# Patient Record
Sex: Female | Born: 1950
Health system: Southern US, Community
[De-identification: ages and names within clinical notes are randomized; demographics above are authoritative.]

## PROBLEM LIST (undated history)

## (undated) DIAGNOSIS — J40 Bronchitis, not specified as acute or chronic: Secondary | ICD-10-CM

## (undated) DIAGNOSIS — I639 Cerebral infarction, unspecified: Secondary | ICD-10-CM

## (undated) DIAGNOSIS — F1910 Other psychoactive substance abuse, uncomplicated: Secondary | ICD-10-CM

## (undated) DIAGNOSIS — I10 Essential (primary) hypertension: Secondary | ICD-10-CM

## (undated) DIAGNOSIS — R413 Other amnesia: Secondary | ICD-10-CM

## (undated) DIAGNOSIS — E119 Type 2 diabetes mellitus without complications: Secondary | ICD-10-CM

## (undated) DIAGNOSIS — E785 Hyperlipidemia, unspecified: Secondary | ICD-10-CM

## (undated) HISTORY — DX: Hyperlipidemia, unspecified: E78.5

## (undated) HISTORY — DX: Cerebral infarction, unspecified: I63.9

## (undated) HISTORY — DX: Other psychoactive substance abuse, uncomplicated: F19.10

## (undated) HISTORY — DX: Other amnesia: R41.3

---

## 2010-07-06 ENCOUNTER — Emergency Department (HOSPITAL_COMMUNITY): Admission: EM | Admit: 2010-07-06 | Discharge: 2010-07-06 | Payer: Self-pay | Admitting: Emergency Medicine

## 2011-09-02 ENCOUNTER — Encounter: Payer: Self-pay | Admitting: *Deleted

## 2011-09-02 ENCOUNTER — Emergency Department (HOSPITAL_COMMUNITY)
Admission: EM | Admit: 2011-09-02 | Discharge: 2011-09-02 | Disposition: A | Discharge status: Home / Self Care | Payer: Medicaid Other | Attending: Emergency Medicine | Admitting: Emergency Medicine

## 2011-09-02 ENCOUNTER — Emergency Department (HOSPITAL_COMMUNITY): Payer: Medicaid Other

## 2011-09-02 DIAGNOSIS — R7309 Other abnormal glucose: Secondary | ICD-10-CM | POA: Insufficient documentation

## 2011-09-02 DIAGNOSIS — J40 Bronchitis, not specified as acute or chronic: Secondary | ICD-10-CM

## 2011-09-02 DIAGNOSIS — I10 Essential (primary) hypertension: Secondary | ICD-10-CM | POA: Insufficient documentation

## 2011-09-02 DIAGNOSIS — R739 Hyperglycemia, unspecified: Secondary | ICD-10-CM

## 2011-09-02 DIAGNOSIS — F17200 Nicotine dependence, unspecified, uncomplicated: Secondary | ICD-10-CM | POA: Insufficient documentation

## 2011-09-02 HISTORY — DX: Essential (primary) hypertension: I10

## 2011-09-02 HISTORY — DX: Bronchitis, not specified as acute or chronic: J40

## 2011-09-02 MED ORDER — ALBUTEROL SULFATE HFA 108 (90 BASE) MCG/ACT IN AERS: 1.0000 | INHALATION_SPRAY | Freq: Four times a day (QID) | RESPIRATORY_TRACT | Status: DC | PRN

## 2011-09-02 MED ORDER — PROMETHAZINE-DM 6.25-15 MG/5ML PO SYRP: 5.0000 mL | ORAL_SOLUTION | Freq: Four times a day (QID) | ORAL | Status: AC | PRN

## 2011-09-02 MED ORDER — IPRATROPIUM BROMIDE 0.02 % IN SOLN
0.5000 mg | Freq: Once | RESPIRATORY_TRACT | Status: AC
  Administered 2011-09-02: 0.5 mg via RESPIRATORY_TRACT
  Filled 2011-09-02: qty 2.5

## 2011-09-02 MED ORDER — METFORMIN HCL 500 MG PO TABS: 500.0000 mg | ORAL_TABLET | Freq: Two times a day (BID) | ORAL | Status: DC

## 2011-09-02 MED ORDER — ALBUTEROL SULFATE (5 MG/ML) 0.5% IN NEBU
5.0000 mg | INHALATION_SOLUTION | Freq: Once | RESPIRATORY_TRACT | Status: AC
  Administered 2011-09-02: 5 mg via RESPIRATORY_TRACT
  Filled 2011-09-02: qty 1

## 2011-09-02 NOTE — ED Notes (Signed)
Cough, congestion, runny nose, chest soreness x 2 wks.

## 2011-09-02 NOTE — ED Notes (Signed)
Pt a/ox4. Resp even and unlabored. NAD at this time. D/C instructions and Rx reviewed with pt. Pt verbalized understanding. Pt ambulated to lobby with steady gate.

## 2011-09-02 NOTE — ED Notes (Signed)
Pt presents with cough, chest congestion, nasal drainage, and chest tenderness with cough x 2 weeks. NAD at this time.

## 2011-09-04 NOTE — ED Provider Notes (Signed)
History     CSN: [id number] Arrival date & time: 09/02/2011 10:49 AM   First MD Initiated Contact with Patient 09/02/11 1108      Chief Complaint  Patient presents with  . Cough    (Consider location/radiation/quality/duration/timing/severity/associated sxs/prior treatment) Patient is a 60 y.o. female presenting with URI. The history is provided by the patient.  URI The primary symptoms include cough. Primary symptoms do not include fever, headaches, sore throat, abdominal pain, nausea, arthralgias or rash. The current episode started more than 1 week ago. This is a new problem. The problem has not changed since onset. The cough began more than 1 week ago. The cough is new. The cough is non-productive.  Symptoms associated with the illness include congestion and rhinorrhea. Associated symptoms comments: She also describes midsternal burning pain when she coughs.. Risk factors for severe complications from URI include diabetes mellitus (she has diabetes as she states she checks her blood glucose on a friends machine  once in a while and it runs from 200 to 400.  She has never seen a doctor for this.).    Past Medical History  Diagnosis Date  . Hypertension   . Bronchitis     History reviewed. No pertinent past surgical history.  No family history on file.  History  Substance Use Topics  . Smoking status: Current Everyday Smoker    Types: Cigarettes  . Smokeless tobacco: Not on file  . Alcohol Use: Yes     occasional    OB History    Grav Para Term Preterm Abortions TAB SAB Ect Mult Living                  Review of Systems  Constitutional: Negative for fever.  HENT: Positive for congestion and rhinorrhea. Negative for sore throat and neck pain.   Eyes: Negative.   Respiratory: Positive for cough and chest tightness. Negative for shortness of breath.   Cardiovascular: Positive for chest pain.  Gastrointestinal: Negative for nausea and abdominal pain.  Genitourinary:  Negative.   Musculoskeletal: Negative for joint swelling and arthralgias.  Skin: Negative.  Negative for rash and wound.  Neurological: Negative for dizziness, weakness, light-headedness, numbness and headaches.  Hematological: Negative.   Psychiatric/Behavioral: Negative.     Allergies  Review of patient's allergies indicates no known allergies.  Home Medications   Current Outpatient Rx  Name Route Sig Dispense Refill  . ALBUTEROL SULFATE HFA 108 (90 BASE) MCG/ACT IN AERS Inhalation Inhale 1-2 puffs into the lungs every 6 (six) hours as needed for wheezing. 1 Inhaler 0  . METFORMIN HCL 500 MG PO TABS Oral Take 1 tablet (500 mg total) by mouth 2 (two) times daily with a meal. 60 tablet 0  . PROMETHAZINE-DM 6.25-15 MG/5ML PO SYRP Oral Take 5 mLs by mouth 4 (four) times daily as needed for cough. 118 mL 0    BP 178/92  Pulse 99  Temp(Src) 100 F (37.8 C) (Oral)  Resp 20  Ht 5\' 4"  (1.626 m)  Wt 210 lb (95.255 kg)  BMI 36.05 kg/m2  SpO2 97%  Physical Exam  Nursing note and vitals reviewed. Constitutional: She is oriented to person, place, and time. She appears well-developed and well-nourished.  HENT:  Head: Normocephalic and atraumatic.  Nose: Rhinorrhea present.  Mouth/Throat: Uvula is midline, oropharynx is clear and moist and mucous membranes are normal.  Eyes: Conjunctivae are normal.  Neck: Normal range of motion.  Cardiovascular: Normal rate, regular rhythm, normal heart sounds and  intact distal pulses.   No murmur heard. Pulmonary/Chest: She is in respiratory distress. She has wheezes. She exhibits tenderness.       Frequent cough  Abdominal: Soft. Bowel sounds are normal. There is no tenderness.  Musculoskeletal: Normal range of motion.  Neurological: She is alert and oriented to person, place, and time.  Skin: Skin is warm and dry.  Psychiatric: She has a normal mood and affect.    ED Course  Procedures (including critical care time)  Labs Reviewed    GLUCOSE, CAPILLARY - Abnormal; Notable for the following:    Glucose-Capillary 241 (*)    All other components within normal limits  LAB REPORT - SCANNED   Dg Chest 2 View  09/02/2011  *RADIOLOGY REPORT*  Clinical Data: Cough and shortness of breath, smoker  CHEST - 2 VIEW  Comparison: July 06, 2010  Findings: The cardiac silhouette, mediastinum, pulmonary vasculature are within normal limits.  Both lungs are clear.  There is no acute bony abnormality.  IMPRESSION: There is no evidence of acute cardiac or pulmonary process.  Original Report Authenticated By: Duayne Cal, M.D.     1. Bronchitis   2. Hyperglycemia       MDM  Bronchitis with normal chest xray.  Untreated DM ,  Also untreated bp.  Pt started on glucophage - given 30 day supply.  Strongly encouraged obtaining pcp.  Referrals given.  Albuterol mdi ,  Promethazine DM for cough.        Fulton Reek, [place] 09/04/11 [phone]

## 2011-09-06 NOTE — ED Provider Notes (Signed)
Medical screening examination/treatment/procedure(s) were conducted as a shared visit with non-physician practitioner(s) and myself.  I personally evaluated the patient during the encounter. History  And physical consistent with bronchitis.  He has been noncompliant with diabetes and blood pressure medication.  Nat Christen, MD 09/06/11 [phone]

## 2013-05-10 ENCOUNTER — Emergency Department (HOSPITAL_COMMUNITY)
Admission: EM | Admit: 2013-05-10 | Discharge: 2013-05-10 | Disposition: A | Discharge status: Home / Self Care | Payer: Medicaid Other | Attending: Emergency Medicine | Admitting: Emergency Medicine

## 2013-05-10 ENCOUNTER — Emergency Department (HOSPITAL_COMMUNITY): Payer: Medicaid Other

## 2013-05-10 ENCOUNTER — Encounter (HOSPITAL_COMMUNITY): Payer: Self-pay | Admitting: Emergency Medicine

## 2013-05-10 DIAGNOSIS — Z8709 Personal history of other diseases of the respiratory system: Secondary | ICD-10-CM | POA: Insufficient documentation

## 2013-05-10 DIAGNOSIS — E1169 Type 2 diabetes mellitus with other specified complication: Secondary | ICD-10-CM | POA: Insufficient documentation

## 2013-05-10 DIAGNOSIS — E7800 Pure hypercholesterolemia, unspecified: Secondary | ICD-10-CM | POA: Insufficient documentation

## 2013-05-10 DIAGNOSIS — I10 Essential (primary) hypertension: Secondary | ICD-10-CM

## 2013-05-10 DIAGNOSIS — R413 Other amnesia: Secondary | ICD-10-CM | POA: Insufficient documentation

## 2013-05-10 DIAGNOSIS — R739 Hyperglycemia, unspecified: Secondary | ICD-10-CM

## 2013-05-10 DIAGNOSIS — Z79899 Other long term (current) drug therapy: Secondary | ICD-10-CM | POA: Insufficient documentation

## 2013-05-10 DIAGNOSIS — Z7982 Long term (current) use of aspirin: Secondary | ICD-10-CM | POA: Insufficient documentation

## 2013-05-10 DIAGNOSIS — F17200 Nicotine dependence, unspecified, uncomplicated: Secondary | ICD-10-CM | POA: Insufficient documentation

## 2013-05-10 DIAGNOSIS — R809 Proteinuria, unspecified: Secondary | ICD-10-CM | POA: Insufficient documentation

## 2013-05-10 HISTORY — DX: Type 2 diabetes mellitus without complications: E11.9

## 2013-05-10 LAB — CBC WITH DIFFERENTIAL/PLATELET
Basophils Absolute: 0 10*3/uL (ref 0.0–0.1)
Basophils Relative: 0 % (ref 0–1)
Eosinophils Relative: 1 % (ref 0–5)
HCT: 44.9 % (ref 36.0–46.0)
MCHC: 33.2 g/dL (ref 30.0–36.0)
MCV: 88 fL (ref 78.0–100.0)
Monocytes Absolute: 0.4 10*3/uL (ref 0.1–1.0)
Platelets: 223 10*3/uL (ref 150–400)
RDW: 12.7 % (ref 11.5–15.5)

## 2013-05-10 LAB — RAPID URINE DRUG SCREEN, HOSP PERFORMED
Amphetamines: NOT DETECTED
Barbiturates: NOT DETECTED
Benzodiazepines: NOT DETECTED
Cocaine: NOT DETECTED

## 2013-05-10 LAB — COMPREHENSIVE METABOLIC PANEL
ALT: 12 U/L (ref 0–35)
AST: 27 U/L (ref 0–37)
Albumin: 2.9 g/dL — ABNORMAL LOW (ref 3.5–5.2)
Calcium: 9.3 mg/dL (ref 8.4–10.5)
Creatinine, Ser: 1.2 mg/dL — ABNORMAL HIGH (ref 0.50–1.10)
Sodium: 136 mEq/L (ref 135–145)
Total Protein: 7 g/dL (ref 6.0–8.3)

## 2013-05-10 LAB — URINALYSIS, ROUTINE W REFLEX MICROSCOPIC
Glucose, UA: 250 mg/dL — AB
Specific Gravity, Urine: >1.03 — ABNORMAL HIGH (ref 1.005–1.030)
Urobilinogen, UA: 0.2 mg/dL (ref 0.0–1.0)
pH: 5.5 (ref 5.0–8.0)

## 2013-05-10 LAB — GLUCOSE, CAPILLARY: Glucose-Capillary: 252 mg/dL — ABNORMAL HIGH (ref 70–99)

## 2013-05-10 LAB — URINE MICROSCOPIC-ADD ON

## 2013-05-10 LAB — TROPONIN I: Troponin I: <0.3 ng/mL (ref <0.30)

## 2013-05-10 MED ORDER — METFORMIN HCL 500 MG PO TABS: 500.0000 mg | ORAL_TABLET | Freq: Two times a day (BID) | ORAL | Status: DC

## 2013-05-10 MED ORDER — HYDROCHLOROTHIAZIDE 25 MG PO TABS: 25.0000 mg | ORAL_TABLET | Freq: Every day | ORAL | Status: DC

## 2013-05-10 NOTE — ED Notes (Addendum)
Patient brought in by family for altered mental status x2 weeks. Patient alert but orientation questionable. Patient answers some questions appropriately. Per family patient found on floor yesterday and stated to them that she had laid there all day. Patient now states that she doesn't recall falling.

## 2013-05-10 NOTE — ED Notes (Signed)
Patient stated if "I could have something to drink I could go to the bathroom".  Nurse approved and patient was given water.

## 2013-05-10 NOTE — ED Notes (Signed)
While removing pt's pants a key and a small box cutter fell from pt's pants pocket. Daughter sitting at bedside became upset as the pt could not tell family member why she had such a device in her pocket.reaasured daughter.

## 2013-05-10 NOTE — ED Provider Notes (Signed)
CSN: [id number]     Arrival date & time 05/10/13  1015 History  This chart was scribed for Johnna Acosta, MD by Jenne Campus, ED Scribe. This patient was seen in room APA16A/APA16A and the patient's care was started at 10:39 AM.   CC: AMS  The history is provided by the patient. No language interpreter was used.    HPI Comments: Kathryn Cook is a 62 y.o. female who presents to the Emergency Department complaining of  gradual onset, gradually worsening, constant AMS for the past 2 months. Sister states that the pt has been having memory trouble and has stopped taking care of herself or doing her normal daily activities. Sister states that she first noticed the symptoms when the pt could not remember her pin number at the grocery store and the symptoms have been progressively worsening since. Sister decided to bring the pt in after her son found her laying in the floor of her bedroom covered in feces, shaking and crying around 11 PM last night. When asked what's wrong, the pt says "some kind of something". Pt appears to remember her address, family and past careers but does not remember recent events. Sister states that the pt was possibly diagnosed with DM or blood sugar problem in jail 3 years ago but did not follow up. She also has a h/o HTN but has been non-complaint with medications. Pt reports a ongoing cough but denies fevers, emesis and diarrhea as associated symptoms. She states that she used to use cocaine, last use was a couple of years ago.  No PCP  Lives with sister, son occassionally stays with pt  Past Medical History  Diagnosis Date  . Hypertension   . Bronchitis   . Diabetes mellitus without complication    History reviewed. No pertinent past surgical history. History reviewed. No pertinent family history. History  Substance Use Topics  . Smoking status: Current Every Day Smoker -- 0.25 packs/day for 45 years    Types: Cigarettes  . Smokeless tobacco: Never Used   . Alcohol Use: Yes     Comment: occasional   No OB history provided.  Review of Systems  Unable to perform ROS: Mental status change    Allergies  Review of patient's allergies indicates no known allergies.  Home Medications   Current Outpatient Rx  Name  Route  Sig  Dispense  Refill  . acetaminophen (TYLENOL) 500 MG tablet   Oral   Take 500 mg by mouth every 6 (six) hours as needed for pain.         Marland Kitchen aspirin 325 MG tablet   Oral   Take 325 mg by mouth daily.         . Aspirin-Acetaminophen-Caffeine (GOODYS EXTRA STRENGTH PO)   Oral   Take 1 Package by mouth daily as needed.         Marland Kitchen ibuprofen (ADVIL,MOTRIN) 200 MG tablet   Oral   Take 400 mg by mouth every 6 (six) hours as needed for pain.         . hydrochlorothiazide (HYDRODIURIL) 25 MG tablet   Oral   Take 1 tablet (25 mg total) by mouth daily.   30 tablet   1   . metFORMIN (GLUCOPHAGE) 500 MG tablet   Oral   Take 1 tablet (500 mg total) by mouth 2 (two) times daily with a meal.   60 tablet   1    BP 198/100  Pulse 73  Temp(Src) 98.4 F (  36.9 C) (Oral)  Resp 20  Ht 5\' 4"  (1.626 m)  Wt 210 lb (95.255 kg)  BMI 36.03 kg/m2  SpO2 100%  Physical Exam  Nursing note and vitals reviewed. Constitutional: She appears well-developed and well-nourished. No distress.  HENT:  Head: Normocephalic and atraumatic.  Tongue is blue  Eyes: Conjunctivae and EOM are normal.  Neck: Neck supple. No tracheal deviation present. No thyromegaly present.  Cardiovascular: Normal rate and regular rhythm.   No murmur heard. Pulmonary/Chest: Effort normal and breath sounds normal. No respiratory distress.  Abdominal: Soft. She exhibits no distension.  Musculoskeletal: Normal range of motion. She exhibits no edema.  Lymphadenopathy:    She has no cervical adenopathy.  Neurological: She is alert. No sensory deficit.  Neurologic exam:  Speech clear, pupils equal round reactive to light, extraocular movements  intact  Normal peripheral visual fields Cranial nerves III through XII normal including no facial droop Follows commands, moves all extremities x4, normal strength to bilateral upper and lower extremities at all major muscle groups including grip Sensation normal to light touch and pinprick Coordination intact, no limb ataxia, finger-nose-finger normal Rapid alternating movements normal No pronator drift Gait normal memory impaired to both short term and long term memory but follows commands without difficulty, alert and oriented to person, location, contact information and family  Skin: Skin is dry.  Psychiatric: She has a normal mood and affect. Her behavior is normal.    ED Course   DIAGNOSTIC STUDIES: Oxygen Saturation is 100% % on ra, normal by my interpretation.    COORDINATION OF CARE: 10:47 AM-Discussed treatment plan which includes CXR, CT of head, CBC panel, CMP and UA with pt at bedside and pt agreed to plan.   Procedures (including critical care time)  Labs Reviewed  COMPREHENSIVE METABOLIC PANEL - Abnormal; Notable for the following:    Glucose, Bld 244 (*)    Creatinine, Ser 1.20 (*)    Albumin 2.9 (*)    GFR calc non Af Amer 48 (*)    GFR calc Af Amer 55 (*)    All other components within normal limits  URINALYSIS, ROUTINE W REFLEX MICROSCOPIC - Abnormal; Notable for the following:    Specific Gravity, Urine >1.030 (*)    Glucose, UA 250 (*)    Hgb urine dipstick MODERATE (*)    Bilirubin Urine SMALL (*)    Protein, ur >300 (*)    All other components within normal limits  GLUCOSE, CAPILLARY - Abnormal; Notable for the following:    Glucose-Capillary 252 (*)    All other components within normal limits  URINE MICROSCOPIC-ADD ON - Abnormal; Notable for the following:    Squamous Epithelial / LPF FEW (*)    Bacteria, UA FEW (*)    Casts GRANULAR CAST (*)    All other components within normal limits  URINE RAPID DRUG SCREEN (HOSP PERFORMED)  CBC WITH  DIFFERENTIAL  TROPONIN I   Dg Chest 1 View  05/10/2013   *RADIOLOGY REPORT*  Clinical Data: Shortness of breath.  CHEST - 1 VIEW  Comparison: 09/02/2011.  Findings: Trachea is midline.  Heart size is accentuated by AP technique.  Lungs are clear.  No pleural fluid.  IMPRESSION: No acute findings.   Original Report Authenticated By: Lorin Picket, M.D.   Ct Head Wo Contrast  05/10/2013   *RADIOLOGY REPORT*  Clinical Data: Altered mental status and memory loss.  CT HEAD WITHOUT CONTRAST  Technique:  Contiguous axial images were obtained from the  base of the skull through the vertex without contrast.  Comparison: None.  Findings: No evidence of acute infarct, acute hemorrhage, mass lesion, mass effect or hydrocephalus.  Atrophy.  Moderate periventricular low attenuation.  Possible remote lacunar infarcts scattered in the thalami and basal ganglia.  Visualized portions of the paranasal sinuses and mastoid air cells are clear.  IMPRESSION:  1.  No acute intracranial abnormality. 2.  Atrophy and chronic microvascular white matter ischemic changes.   Original Report Authenticated By: Lorin Picket, M.D.   1. Hyperglycemia   2. Memory loss   3. Proteinuria   4. Hypertension     MDM  The pt has new onset AMS which was gradual in onset and persistent, gradually worsening per sister whom she lives with.  She has no focal findings other than some memory loss on exam.  She is benign in appearance, VS show hypertension  ED ECG REPORT  I personally interpreted this EKG   Date: 05/10/2013   Rate: 81  Rhythm: normal sinus rhythm  QRS Axis: left  Intervals: normal  ST/T Wave abnormalities: nonspecific ST/T changes  Conduction Disutrbances:none  Narrative Interpretation: LVH with repol abnormality present  Old EKG Reviewed: none available  Laboratory workup is normal except for proteinuria, mild hyperglycemia and mild dehydration. I have informed the patient these results and she has expressed her  understanding to the need for followup. Family members are here and will help her take her medications and followup with local family doctors.  I will also start her on antihypertensives  Meds given in ED:  Medications - No data to display  New Prescriptions   HYDROCHLOROTHIAZIDE (HYDRODIURIL) 25 MG TABLET    Take 1 tablet (25 mg total) by mouth daily.   METFORMIN (GLUCOPHAGE) 500 MG TABLET    Take 1 tablet (500 mg total) by mouth 2 (two) times daily with a meal.      I personally performed the services described in this documentation, which was scribed in my presence. The recorded information has been reviewed and is accurate.        Johnna Acosta, MD 05/10/13 [phone]

## 2013-05-11 LAB — URINE CULTURE

## 2013-05-31 ENCOUNTER — Encounter: Payer: Self-pay | Admitting: Family Medicine

## 2013-05-31 ENCOUNTER — Ambulatory Visit (INDEPENDENT_AMBULATORY_CARE_PROVIDER_SITE_OTHER): Payer: Medicaid Other | Admitting: Family Medicine

## 2013-05-31 VITALS — BP 168/100 | Ht 65.0 in | Wt 201.6 lb

## 2013-05-31 DIAGNOSIS — F1021 Alcohol dependence, in remission: Secondary | ICD-10-CM

## 2013-05-31 DIAGNOSIS — E119 Type 2 diabetes mellitus without complications: Secondary | ICD-10-CM | POA: Insufficient documentation

## 2013-05-31 DIAGNOSIS — Z87898 Personal history of other specified conditions: Secondary | ICD-10-CM

## 2013-05-31 DIAGNOSIS — R413 Other amnesia: Secondary | ICD-10-CM | POA: Insufficient documentation

## 2013-05-31 DIAGNOSIS — I10 Essential (primary) hypertension: Secondary | ICD-10-CM

## 2013-05-31 MED ORDER — ACCU-CHEK SOFT TOUCH LANCETS MISC: Status: DC

## 2013-05-31 MED ORDER — ACCU-CHEK ADVANTAGE DIABETES KIT: PACK | Status: DC

## 2013-05-31 MED ORDER — GLUCOSE BLOOD VI STRP: ORAL_STRIP | Status: DC

## 2013-05-31 NOTE — Patient Instructions (Addendum)
Dementia Dementia is a word that is used to describe problems with the brain and how it works. People with dementia have memory loss. They may also have problems with thinking, speaking, or solving problems. It can affect how they act around people, how they do their job, their mood, and their personality. These changes may not show up for a long time. Family or friends may not notice problems in the early part of this disease. HOME CARE The following tips are for the person living with, or caring for, the person with dementia. Make the home safe.  Remove locks on bathroom doors.  Use childproof locks on cabinets where alcohol, cleaning supplies, or chemicals are stored.  Put outlet covers in electrical outlets.  Put in childproof locks to keep doors and windows safe.  Remove stove knobs, or put in safety knobs that shut off on their own.  Lower the temperature on water heaters.  Label medicines. Lock them in a safe place.  Keep knives, lighters, matches, power tools, and guns out of reach or in a safe place.  Remove objects that might break or can hurt the person.  Make sure lighting is good inside and outside.  Put in grab bars if needed.  Use a device that detects falls or other needs for help. Lessen confusion.  Keep familiar objects and people around.  Use night lights or low lit (dim) lights at night.  Label objects or areas.  Use reminders, notes, or directions for daily activities or tasks.  Keep a simple routine that is the same for waking, meals, bathing, dressing, and bedtime.  Create a calm and quiet home.  Put up clocks and calendars.  Keep emergency numbers and the home address near all phones.  Help show the different times of day. Open the curtains during the day to let light in. Speak clearly and directly.  Choose simple words and short sentences.  Use a gentle, calm voice.  Do not interrupt.  If the person has a hard time finding a word to  use, give them the word or thought.  Ask 1 question at a time. Give enough time for the person to answer. Repeat the question if the person does not answer. Do things that lessen restlessness.  Provide a comfortable bed.  Have the same bedtime routine every night.  Have a regular walking and activity schedule.  Lessen naps during the day.  Do not let the person drink a lot of caffeine.  Go to events that are not overwhelming. Eat well and drink fluids.  Lessen distractions during meal times and snacks.  Avoid foods that are too hot or too cold.  Watch how the person chews and swallows. This is to make sure they do not choke. Other  Keep all vision, hearing, dental, and medical visits with the doctor.  Only give medicines as told by the doctor.  Watch the person's driving ability. Do not let the person drive if he or she cannot drive safely.  Use a program that helps find a person if they become missing. You may need to register with this program. GET HELP RIGHT AWAY IF:   A fever of 102 F (38.9 C) develops.  Confusion develops or gets worse.  Sleepiness develops or gets worse.  Staying awake is hard to do.  New behavior problems start like mood swings, aggression, and seeing things that are not there.  Problems with balance, speech, or falling develop.  Problems swallowing develop.  Any  problems of another sickness develop. MAKE SURE YOU:  Understand these instructions.  Will watch his or her condition.  Will get help right away if he or she is not doing well or gets worse. Document Released: 08/26/2008 Document Revised: 12/06/2011 Document Reviewed: 02/08/2011 [facility] Patient Information 2014 [place], [place]. Blood Sugar Monitoring, Adult GLUCOSE METERS FOR SELF-MONITORING OF BLOOD GLUCOSE  It is important to be able to correctly measure your blood sugar (glucose). You can use a blood glucose monitor (a small battery-operated device) to check your  glucose level at any time. This allows you and your caregiver to monitor your diabetes and to determine how well your treatment plan is working. The process of monitoring your blood glucose with a glucose meter is called self-monitoring of blood glucose (SMBG). When people with diabetes control their blood sugar, they have better health. To test for glucose with a typical glucose meter, place the disposable strip in the meter. Then place a small sample of blood on the "test strip." The test strip is coated with chemicals that combine with glucose in blood. The meter measures how much glucose is present. The meter displays the glucose level as a number. Several new models can record and store a number of test results. Some models can connect to personal computers to store test results or print them out.  Newer meters are often easier to use than older models. Some meters allow you to get blood from places other than your fingertip. Some new models have automatic timing, error codes, signals, or barcode readers to help with proper adjustment (calibration). Some meters have a large display screen or spoken instructions for people with visual impairments.  INSTRUCTIONS FOR USING GLUCOSE METERS  Wash your hands with soap and warm water, or clean the area with alcohol. Dry your hands completely.  Prick the side of your fingertip with a lancet (a sharp-pointed tool used by hand).  Hold the hand down and gently milk the finger until a small drop of blood appears. Catch the blood with the test strip.  Follow the instructions for inserting the test strip and using the SMBG meter. Most meters require the meter to be turned on and the test strip to be inserted before applying the blood sample.  Record the test result.  Read the instructions carefully for both the meter and the test strips that go with it. Meter instructions are found in the user manual. Keep this manual to help you solve any problems that may  arise. Many meters use "error codes" when there is a problem with the meter, the test strip, or the blood sample on the strip. You will need the manual to understand these error codes and fix the problem.  New devices are available such as laser lancets and meters that can test blood taken from "alternative sites" of the body, other than fingertips. However, you should use standard fingertip testing if your glucose changes rapidly. Also, use standard testing if:  You have eaten, exercised, or taken insulin in the past 2 hours.  You think your glucose is low.  You tend to not feel symptoms of low blood glucose (hypoglycemia).  You are ill or under stress.  Clean the meter as directed by the manufacturer.  Test the meter for accuracy as directed by the manufacturer.  Take your meter with you to your caregiver's office. This way, you can test your glucose in front of your caregiver to make sure you are using the meter correctly. Your  caregiver can also take a sample of blood to test using a routine lab method. If values on the glucose meter are close to the lab results, you and your caregiver will see that your meter is working well and you are using good technique. Your caregiver will advise you about what to do if the results do not match. FREQUENCY OF TESTING  Your caregiver will tell you how often you should check your blood glucose. This will depend on your type of diabetes, your current level of diabetes control, and your types of medicines. The following are general guidelines, but your care plan may be different. Record all your readings and the time of day you took them for review with your caregiver.   Diabetes type 1.  When you are using insulin with good diabetic control (either multiple daily injections or via a pump), you should check your glucose 4 times a day.  If your diabetes is not well controlled, you may need to monitor more frequently, including before meals and 2 hours  after meals, at bedtime, and occasionally between 2 a.m. and 3 a.m.  You should always check your glucose before a dose of insulin or before changing the rate on your insulin pump.  Diabetes type 2.  Guidelines for SMBG in diabetes type 2 are not as well defined.  If you are on insulin, follow the guidelines above.  If you are on medicines, but not insulin, and your glucose is not well controlled, you should test at least twice daily.  If you are not on insulin, and your diabetes is controlled with medicines or diet alone, you should test at least once daily, usually before breakfast.  A weekly profile will help your caregiver advise you on your care plan. The week before your visit, check your glucose before a meal and 2 hours after a meal at least daily. You may want to test before and after a different meal each day so you and your caregiver can tell how well controlled your blood sugars are throughout the course of a 24 hour period.  Gestational diabetes (diabetes during pregnancy).  Frequent testing is often necessary. Accurate timing is important.  If you are not on insulin, check your glucose 4 times a day. Check it before breakfast and 1 hour after the start of each meal.  If you are on insulin, check your glucose 6 times a day. Check it before each meal and 1 hour after the first bite of each meal.  General guidelines.  More frequent testing is required at the start of insulin treatment. Your caregiver will instruct you.  Test your glucose any time you suspect you have low blood sugar (hypoglycemia).  You should test more often when you change medicines, when you have unusual stress or illness, or in other unusual circumstances. OTHER THINGS TO KNOW ABOUT GLUCOSE METERS  Measurement Range. Most glucose meters are able to read glucose levels over a broad range of values from as low as 0 to as high as 600 mg/dL. If you get an extremely high or low reading from your meter, you  should first confirm it with another reading. Report very high or very low readings to your caregiver.  Whole Blood Glucose versus Plasma Glucose. Some older home glucose meters measure glucose in your whole blood. In a lab or when using some newer home glucose meters, the glucose is measured in your plasma (one component of blood). The difference can be important. It is important  for you and your caregiver to know whether your meter gives its results as "whole blood equivalent" or "plasma equivalent."  Display of High and Low Glucose Values. Part of learning how to operate a meter is understanding what the meter results mean. Know how high and low glucose concentrations are displayed on your meter.  Factors that Affect Glucose Meter Performance. The accuracy of your test results depends on many factors and varies depending on the brand and type of meter. These factors include:  Low red blood cell count (anemia).  Substances in your blood (such as uric acid, vitamin C, and others).  Environmental factors (temperature, humidity, altitude).  Name-brand versus generic test strips.  Calibration. Make sure your meter is set up properly. It is a good idea to do a calibration test with a control solution recommended by the manufacturer of your meter whenever you begin using a fresh bottle of test strips. This will help verify the accuracy of your meter.  Improperly stored, expired, or defective test strips. Keep your strips in a dry place with the lid on.  Soiled meter.  Inadequate blood sample. NEW TECHNOLOGIES FOR GLUCOSE TESTING Alternative site testing Some glucose meters allow testing blood from alternative sites. These include the:  Upper arm.  Forearm.  Base of the thumb.  Thigh. Sampling blood from alternative sites may be desirable. However, it may have some limitations. Blood in the fingertips show changes in glucose levels more quickly than blood in other parts of the body. This  means that alternative site test results may be different from fingertip test results, not because of the meter's ability to test accurately, but because the actual glucose concentration can be different.  Continuous Glucose Monitoring Devices to measure your blood glucose continuously are available, and others are in development. These methods can be more expensive than self-monitoring with a glucose meter. However, it is uncertain how effective and reliable these devices are. Your caregiver will advise you if this approach makes sense for you. IF BLOOD SUGARS ARE CONTROLLED, PEOPLE WITH DIABETES REMAIN HEALTHIER.  SMBG is an important part of the treatment plan of patients with diabetes mellitus. Below are reasons for using SMBG:   It confirms that your glucose is at a specific, healthy level.  It detects hypoglycemia and severe hyperglycemia.  It allows you and your caregiver to make adjustments in response to changes in lifestyle for individuals requiring medicine.  It determines the need for starting insulin therapy in temporary diabetes that happens during pregnancy (gestational diabetes). Document Released: 09/16/2003 Document Revised: 12/06/2011 Document Reviewed: 01/07/2011 [facility] Patient Information 2014 [place].

## 2013-05-31 NOTE — Progress Notes (Signed)
Subjective:    Patient ID: Kathryn Cook, female    DOB: Aug 08, 1951, 62 y.o.   MRN: [id number]  HPI Comments: Kathryn Cook is a 62 y.o AAF here to establish care as a new patient.  She is here with her 61 y.o sister also of whom she lives with. The sister begins by stating that she had to go to the ER in Aug 2014 due to the patient's son finding her on the floor. When he found her on the floor one night, he stated that she had loss bowel and bladder incontinence. He says the urine was dry on her clothes so he is unsure how long she was down for.  The sister carried the patient tot he ER that next morning and had labs done which revealed an elevated blood sugar around 250s.  She was also hypertensive. She had a CT scan of her head which showed microvascular disease but no acute changes; no mass effect, hydrocephalus, hemorrhage. It did show some remote lacunar infarcts and periventricular low attenuation.  She also had an EKG which showed NSR with left atrial enlargement with LVH and T wave abnormality which would be considered as lateral ischemia, age undetermined. She was sent out that same day on HCTZ and Metformin for her HTN and DM. The sister says they  Have no idea what her sugar has been at home due to them not sending her home with a meter or strips.   The sister also has complaints of forgetfullness. She says the patient has gotten worse in the last 3-4 months. She recalls an episode where the patient has walked outside of her home down the street and was found by a neighbor who brought her back home. The patient at that time, had no idea where she was wanting to go. The sister says she has forgotten phone numbers and names of important family members that she shouldn't be forgetting. The sister reports a decline in the capability of performing her ADL'S. She says she has slowed down a great bit and also that it takes her a long time to walk across the room. ADLS: per sister is that she has to do  everything for the patient. She use to be able to cook and dress and groom herself. The sister says she has to do everything for her including make her bathe. She says she has gotten to the point that she doesn't even want to bathe anymore. This has been going on for the last 3-4 months, even before she was found down and had to go to the ER in Aug 2014.  The sister also states that in the last 2-3 months, she has slowed down a lot.  The sister states she has had memory loss that has gotten worse. This is the reason of coming to the clinic today.   PMH: diabetes and HTN Medicines: HCTZ and Metformin Allergies: none Past surgeries: none Family hx: lives with 26 y.o sister who is here today of which she lives with. Her 61 y.o sister lives in [place]. No hx of dementia in the family. Social: smoke sometimes a few cigarettes a week. She hasn't used alcohol in the last 3 months. She says she use to drink 2-3 cans of beer a week. The 20 oz cans most occasions.     Review of Systems  Constitutional: Negative for chills, appetite change, fatigue and unexpected weight change.  HENT: Negative for hearing loss, ear pain, congestion and neck pain.  Eyes: Negative for itching and visual disturbance.  Respiratory: Negative for chest tightness, shortness of breath and wheezing.   Cardiovascular: Negative for chest pain and palpitations.  Gastrointestinal: Negative for nausea, abdominal pain, diarrhea and constipation.  Endocrine: Negative for polydipsia and polyuria.  Musculoskeletal: Negative for myalgias and back pain.  Neurological: Negative for dizziness, seizures, syncope, weakness, numbness and headaches.  Psychiatric/Behavioral: Positive for confusion and decreased concentration. Negative for suicidal ideas, sleep disturbance, self-injury and agitation. The patient is not nervous/anxious and is not hyperactive.        Memory loss per sister; patient says she can't remember things as before         Objective:   Physical Exam  Nursing note and vitals reviewed. Constitutional: She appears well-developed and well-nourished.  HENT:  Head: Normocephalic and atraumatic.  Right Ear: External ear normal.  Left Ear: External ear normal.  Eyes: Pupils are equal, round, and reactive to light. Scleral icterus is present.  No nystagmus  Cardiovascular: Normal rate, regular rhythm and normal heart sounds.   Pulmonary/Chest: Effort normal and breath sounds normal.  Abdominal: Soft. Bowel sounds are normal. She exhibits no distension. There is no tenderness.  Musculoskeletal: Normal range of motion.  Neurological: She is alert. She has normal reflexes. She is disoriented. She displays a negative Romberg sign.  MMSE-15  Skin: Skin is warm and dry.  Psychiatric: She has a normal mood and affect. Her speech is normal. Thought content normal. She is slowed. She is not hyperactive, not withdrawn and not combative. Cognition and memory are impaired. She exhibits abnormal recent memory.  Patient can't answer questions regarding date or time of year correctly. She often says she forgot but she'll remember in a little while She is inattentive.      Assessment & Plan:  Kanosha was seen today for new patient, hypertension and blood sugar problem.  Diagnoses and associated orders for this visit:  Kidney disease, chronic, stage III (GFR 30-59 ml/min)  Memory loss - TSH; Future - RPR; Future - Comprehensive metabolic panel; Future - CBC with Differential; Future - Magnesium; Future - Vitamin B12; Future - Folate; Future - Drug screen panel (serum); Future - Vitamin B1; Future - TSH - RPR - Comprehensive metabolic panel - CBC with Differential - Magnesium - Vitamin B12 - Folate - Drug screen panel (serum) - Vitamin B1  Diabetes - Ambulatory referral to diabetic education - Hemoglobin A1c; Future - Hemoglobin A1c  HTN (hypertension)  History of alcohol use  -due to memory  loss, will get labs to include Vitamin B12, B1(thiamine) and folate. Adding B1 due to hx of alcohol use and if low, her memory loss will hint at a possible Wernicke's encephalopathy picture.  Also checking TSH, RPR, along with renal and liver function studies. She has a hx of depressed kidney function when seen in the ER last month and since starting HCTZ and metformin, will recheck renal function today. Will also get Hgb A1c and drug screen.   -Sending to diabetic education for newly diagnosed diabetes. Also sending a rx for diabetic testing meter and strips to keep track of it at home. Patient instructed to check blood sugars morning and evening time.   -to follow up on labs and to follow up in 2 weeks.  -have counseled patient and sister the need for patient to have 24 hour supervision at all times. Based on her symptoms per sister and the MMSE results, this is imperative for her safety. The sister and  patient voiced understanding.

## 2013-05-31 NOTE — Progress Notes (Signed)
Patient ID: JOVONNE WOHLWEND, female   DOB: Aug 07, 1951, 62 y.o.   MRN: [id number]  Kerin is a 62 y.o AAF here to establish care as a new patient.  She has a PMH of dementia where her sister states she will leave home and forgets where she's going. She also had HTN and Diabetes. She was just recently diagnosed with diabetes in August 2014 at the ER.  She went because her 54 y.o son found her on the floor at home at night.  He noted that she had bowel movement and urine along with shakiness.  Her sister took her to the ER the next morning. A EKG was done along with blood work. She was diagnosed with diabetes at that time.  She also was given a medication for HTN and diabetes and was discharged from the ER.  She didn't have a meter to check her sugar so they don't know what her sugar has been.  ADLS: per sister is that she has to do everything for the patient.   The sister also states that in the last 2-3 months, she has slowed down a lot.  The sister states she has had memory loss that has gotten worse.   PMH: diabetes and HTN Medicines: HCTZ and Metformin Allergies: none Past surgeries: none Family hx: lives with 70 y.o sister who is here today of which she lives with. Her 18 y.o sister lives in [place]. No hx of dementia in the family. Social:smoke sometimes a few cigarettes a week. She hasn't used alcohol in the last 3 months

## 2013-06-01 ENCOUNTER — Encounter: Payer: Self-pay | Admitting: Family Medicine

## 2013-06-01 LAB — COMPREHENSIVE METABOLIC PANEL
Albumin: 3.4 g/dL — ABNORMAL LOW (ref 3.5–5.2)
Alkaline Phosphatase: 56 U/L (ref 39–117)
BUN: 20 mg/dL (ref 6–23)
Calcium: 9.9 mg/dL (ref 8.4–10.5)
Chloride: 103 mEq/L (ref 96–112)
Creat: 0.95 mg/dL (ref 0.50–1.10)
Glucose, Bld: 97 mg/dL (ref 70–99)
Potassium: 5.2 mEq/L (ref 3.5–5.3)

## 2013-06-01 LAB — HEMOGLOBIN A1C
Hgb A1c MFr Bld: 7.1 % — ABNORMAL HIGH (ref <5.7)
Mean Plasma Glucose: 157 mg/dL — ABNORMAL HIGH (ref <117)

## 2013-06-01 LAB — CBC WITH DIFFERENTIAL/PLATELET
HCT: 42.4 % (ref 36.0–46.0)
Hemoglobin: 14 g/dL (ref 12.0–15.0)
Lymphocytes Relative: 40 % (ref 12–46)
Lymphs Abs: 2.8 10*3/uL (ref 0.7–4.0)
Monocytes Relative: 5 % (ref 3–12)
Neutro Abs: 3.8 10*3/uL (ref 1.7–7.7)
Neutrophils Relative %: 54 % (ref 43–77)
RBC: 4.9 MIL/uL (ref 3.87–5.11)

## 2013-06-01 LAB — FOLATE: Folate: 12.5 ng/mL

## 2013-06-01 LAB — MAGNESIUM: Magnesium: 1.9 mg/dL (ref 1.5–2.5)

## 2013-06-01 LAB — VITAMIN B12: Vitamin B-12: 763 pg/mL (ref 211–911)

## 2013-06-04 ENCOUNTER — Telehealth: Payer: Self-pay | Admitting: Family Medicine

## 2013-06-04 NOTE — Telephone Encounter (Signed)
Spoke to the caregiver, [name] who is at home with the patient. I saw her initially last week for memory loss.  Blood work was done which was essentially negative. The sister called back today stating that the patient got worse over the weekend. She was asking for people that weren't there, i.e her mother that has been deceased over some years ago.    The sister also asked about placing the patient into a facility.   I advised the sister about lab results and that if her memory has gotten worse, she may have an infection, i.e lung or urinary tract infection; which may be causing worsening mental status. I have also advised the sister to take the patient to the ER for further evaluation and to rule out a UTI, or other infection, that may be causing her worsening mental state. The sister voiced understanding.

## 2013-06-10 ENCOUNTER — Inpatient Hospital Stay (HOSPITAL_COMMUNITY): Payer: Medicaid Other

## 2013-06-10 ENCOUNTER — Emergency Department (HOSPITAL_COMMUNITY): Payer: Medicaid Other

## 2013-06-10 ENCOUNTER — Encounter (HOSPITAL_COMMUNITY): Payer: Self-pay | Admitting: Physical Medicine and Rehabilitation

## 2013-06-10 ENCOUNTER — Inpatient Hospital Stay (HOSPITAL_COMMUNITY)
Admission: EM | Admit: 2013-06-10 | Discharge: 2013-06-14 | DRG: 065 | Disposition: A | Discharge status: Skilled Nursing Facility | Payer: Medicaid Other | Attending: Family Medicine | Admitting: Family Medicine

## 2013-06-10 DIAGNOSIS — I6330 Cerebral infarction due to thrombosis of unspecified cerebral artery: Principal | ICD-10-CM | POA: Diagnosis present

## 2013-06-10 DIAGNOSIS — I5030 Unspecified diastolic (congestive) heart failure: Secondary | ICD-10-CM

## 2013-06-10 DIAGNOSIS — F1021 Alcohol dependence, in remission: Secondary | ICD-10-CM

## 2013-06-10 DIAGNOSIS — E119 Type 2 diabetes mellitus without complications: Secondary | ICD-10-CM

## 2013-06-10 DIAGNOSIS — I130 Hypertensive heart and chronic kidney disease with heart failure and stage 1 through stage 4 chronic kidney disease, or unspecified chronic kidney disease: Secondary | ICD-10-CM | POA: Diagnosis present

## 2013-06-10 DIAGNOSIS — G8190 Hemiplegia, unspecified affecting unspecified side: Secondary | ICD-10-CM | POA: Diagnosis present

## 2013-06-10 DIAGNOSIS — F1910 Other psychoactive substance abuse, uncomplicated: Secondary | ICD-10-CM | POA: Diagnosis present

## 2013-06-10 DIAGNOSIS — F17200 Nicotine dependence, unspecified, uncomplicated: Secondary | ICD-10-CM | POA: Diagnosis present

## 2013-06-10 DIAGNOSIS — I6350 Cerebral infarction due to unspecified occlusion or stenosis of unspecified cerebral artery: Secondary | ICD-10-CM

## 2013-06-10 DIAGNOSIS — F0390 Unspecified dementia without behavioral disturbance: Secondary | ICD-10-CM | POA: Diagnosis present

## 2013-06-10 DIAGNOSIS — I10 Essential (primary) hypertension: Secondary | ICD-10-CM

## 2013-06-10 DIAGNOSIS — N183 Chronic kidney disease, stage 3 unspecified: Secondary | ICD-10-CM

## 2013-06-10 DIAGNOSIS — I119 Hypertensive heart disease without heart failure: Secondary | ICD-10-CM

## 2013-06-10 DIAGNOSIS — N390 Urinary tract infection, site not specified: Secondary | ICD-10-CM

## 2013-06-10 DIAGNOSIS — Z87898 Personal history of other specified conditions: Secondary | ICD-10-CM

## 2013-06-10 DIAGNOSIS — E785 Hyperlipidemia, unspecified: Secondary | ICD-10-CM | POA: Diagnosis present

## 2013-06-10 DIAGNOSIS — N179 Acute kidney failure, unspecified: Secondary | ICD-10-CM | POA: Diagnosis present

## 2013-06-10 DIAGNOSIS — R413 Other amnesia: Secondary | ICD-10-CM

## 2013-06-10 DIAGNOSIS — Z66 Do not resuscitate: Secondary | ICD-10-CM | POA: Diagnosis present

## 2013-06-10 DIAGNOSIS — I639 Cerebral infarction, unspecified: Secondary | ICD-10-CM

## 2013-06-10 DIAGNOSIS — I509 Heart failure, unspecified: Secondary | ICD-10-CM | POA: Diagnosis present

## 2013-06-10 LAB — POCT I-STAT, CHEM 8
Calcium, Ion: 1.16 mmol/L (ref 1.13–1.30)
Glucose, Bld: 96 mg/dL (ref 70–99)
HCT: 41 % (ref 36.0–46.0)
Hemoglobin: 13.9 g/dL (ref 12.0–15.0)

## 2013-06-10 LAB — COMPREHENSIVE METABOLIC PANEL
ALT: 9 U/L (ref 0–35)
AST: 15 U/L (ref 0–37)
Alkaline Phosphatase: 67 U/L (ref 39–117)
CO2: 29 mEq/L (ref 19–32)
Calcium: 10 mg/dL (ref 8.4–10.5)
Chloride: 100 mEq/L (ref 96–112)
GFR calc non Af Amer: 63 mL/min — ABNORMAL LOW (ref >90)
Potassium: 4.3 mEq/L (ref 3.5–5.1)
Sodium: 140 mEq/L (ref 135–145)

## 2013-06-10 LAB — PROTIME-INR
INR: 0.91 (ref 0.00–1.49)
Prothrombin Time: 12.1 seconds (ref 11.6–15.2)

## 2013-06-10 LAB — CBC
Platelets: 199 10*3/uL (ref 150–400)
RBC: 4.68 MIL/uL (ref 3.87–5.11)
RDW: 13 % (ref 11.5–15.5)
WBC: 7.1 10*3/uL (ref 4.0–10.5)

## 2013-06-10 LAB — DIFFERENTIAL
Basophils Absolute: 0 10*3/uL (ref 0.0–0.1)
Eosinophils Relative: 1 % (ref 0–5)
Lymphocytes Relative: 41 % (ref 12–46)
Neutro Abs: 3.6 10*3/uL (ref 1.7–7.7)
Neutrophils Relative %: 51 % (ref 43–77)

## 2013-06-10 LAB — ETHANOL: Alcohol, Ethyl (B): <11 mg/dL (ref 0–11)

## 2013-06-10 LAB — GLUCOSE, CAPILLARY
Glucose-Capillary: 102 mg/dL — ABNORMAL HIGH (ref 70–99)
Glucose-Capillary: 173 mg/dL — ABNORMAL HIGH (ref 70–99)

## 2013-06-10 LAB — APTT: aPTT: 21 seconds — ABNORMAL LOW (ref 24–37)

## 2013-06-10 MED ORDER — HYDRALAZINE HCL 20 MG/ML IJ SOLN
10.0000 mg | Freq: Four times a day (QID) | INTRAMUSCULAR | Status: DC | PRN
  Administered 2013-06-10 – 2013-06-12 (×3): 10 mg via INTRAVENOUS
  Filled 2013-06-10 (×4): qty 1

## 2013-06-10 MED ORDER — INSULIN ASPART 100 UNIT/ML ~~LOC~~ SOLN
0.0000 [IU] | Freq: Three times a day (TID) | SUBCUTANEOUS | Status: DC
  Administered 2013-06-12: 3 [IU] via SUBCUTANEOUS
  Administered 2013-06-13 (×2): 1 [IU] via SUBCUTANEOUS

## 2013-06-10 MED ORDER — HEPARIN SODIUM (PORCINE) 5000 UNIT/ML IJ SOLN
5000.0000 [IU] | Freq: Three times a day (TID) | INTRAMUSCULAR | Status: DC
  Administered 2013-06-10 – 2013-06-14 (×13): 5000 [IU] via SUBCUTANEOUS
  Filled 2013-06-10 (×16): qty 1

## 2013-06-10 MED ORDER — POLYETHYLENE GLYCOL 3350 17 G PO PACK
17.0000 g | PACK | Freq: Every day | ORAL | Status: DC | PRN
  Filled 2013-06-10: qty 1

## 2013-06-10 MED ORDER — HYDROCODONE-ACETAMINOPHEN 5-325 MG PO TABS: 1.0000 | ORAL_TABLET | ORAL | Status: DC | PRN

## 2013-06-10 MED ORDER — SODIUM CHLORIDE 0.9 % IJ SOLN
3.0000 mL | Freq: Two times a day (BID) | INTRAMUSCULAR | Status: DC
  Administered 2013-06-11 – 2013-06-12 (×2): 3 mL via INTRAVENOUS

## 2013-06-10 MED ORDER — ONDANSETRON HCL 4 MG PO TABS: 4.0000 mg | ORAL_TABLET | Freq: Four times a day (QID) | ORAL | Status: DC | PRN

## 2013-06-10 MED ORDER — ONDANSETRON HCL 4 MG/2ML IJ SOLN: 4.0000 mg | Freq: Four times a day (QID) | INTRAMUSCULAR | Status: DC | PRN

## 2013-06-10 MED ORDER — SODIUM CHLORIDE 0.9 % IV SOLN
INTRAVENOUS | Status: AC
  Administered 2013-06-10 – 2013-06-11 (×2): via INTRAVENOUS

## 2013-06-10 MED ORDER — ASPIRIN 325 MG PO TABS
325.0000 mg | ORAL_TABLET | Freq: Every day | ORAL | Status: DC
  Administered 2013-06-10 – 2013-06-14 (×5): 325 mg via ORAL
  Filled 2013-06-10 (×5): qty 1

## 2013-06-10 MED ORDER — INSULIN ASPART 100 UNIT/ML ~~LOC~~ SOLN: 0.0000 [IU] | SUBCUTANEOUS | Status: DC

## 2013-06-10 MED ORDER — ALBUTEROL SULFATE (5 MG/ML) 0.5% IN NEBU: 2.5000 mg | INHALATION_SOLUTION | RESPIRATORY_TRACT | Status: DC | PRN

## 2013-06-10 MED ORDER — GUAIFENESIN-DM 100-10 MG/5ML PO SYRP: 5.0000 mL | ORAL_SOLUTION | ORAL | Status: DC | PRN

## 2013-06-10 MED ORDER — ASPIRIN 300 MG RE SUPP
300.0000 mg | Freq: Every day | RECTAL | Status: DC
  Filled 2013-06-10 (×5): qty 1

## 2013-06-10 NOTE — H&P (Signed)
[facility]                                                                                    Patient Demographics  Kathryn Cook, is a 62 y.o. female  MRN: [id number]   DOB - [birth year]  Admit Date - 06/10/2013  Outpatient Primary MD for the patient is Sandi Mealy, MD   With History of -  Past Medical History  Diagnosis Date  . Hypertension   . Bronchitis   . Diabetes mellitus without complication   . Substance abuse     alcohol, but patient states she doesn't drink that much anymore      History reviewed. No pertinent past surgical history.  in for   Chief Complaint  Patient presents with  . Code Stroke     HPI  Kathryn Cook  is a 62 y.o. female, with recently diagnosed hypertension and diabetes mellitus, who saw a physician for the first time few weeks ago, prolonged history of smoking, alcohol and recreational drug abuse including crack cocaine, quit all substance abuse and smoking about 3 months ago, also carries a diagnosis of early dementia (question if this is vascular dementia secondary to multiple occult strokes) , patient with above history who lives with her daughter was brought in by family members after she was noticed to have extreme right-sided weakness this morning, according to the daughter patient was doing great last night and was seen in her usual state of health around 8:30 PM yesterday, this morning when family members went to wake up the patient around 8:30 in the morning she was found to have right-sided weakness and then brought to the ER. In the ER workup was suggestive of left MCA territory CVA which was ischemic in nature, she was found to be outside TPA window, stroke team was consulted who requested hospitalist admission.   Patient herself is lying in bed, she does not have any subjective complaints although she realizes she is right-sided weakness but she does not volunteer this information, she denies any headache chest pain or  palpitations, no abdominal pain or shortness of breath, she does not recall when she started developing right-sided weakness. Denies any head injuries, no previous history of CVA, dysrhythmia, CAD.    Review of Systems    In addition to the HPI above,  No Fever-chills, No Headache, No changes with Vision or hearing, No problems swallowing food or Liquids, No Chest pain, Cough or Shortness of Breath, No Abdominal pain, No Nausea or Vommitting, Bowel movements are regular, No Blood in stool or Urine, No dysuria, No new skin rashes or bruises, No new joints pains-aches,  No new weakness, tingling, numbness in any extremity, except as above No recent weight gain or loss, No polyuria, polydypsia or polyphagia, No significant Mental Stressors.  A full 10 point Review of Systems was done, except as stated above, all other Review of Systems were negative.   Social History History  Substance Use Topics  . Smoking status: Current Every Day Smoker -- 0.25 packs/day for 45 years    Types: Cigarettes  . Smokeless tobacco: Never Used  . Alcohol Use: 1.2 oz/week  2 Cans of beer per week     Comment: occasional     Family History No CVA  Prior to Admission medications   Medication Sig Start Date End Date Taking? Authorizing Provider  hydrochlorothiazide (HYDRODIURIL) 25 MG tablet Take 25 mg by mouth daily.   Yes Historical Provider, MD  metFORMIN (GLUCOPHAGE) 500 MG tablet Take 500 mg by mouth 2 (two) times daily with a meal.   Yes Historical Provider, MD    No Known Allergies  Physical Exam  Vitals  Blood pressure 199/104, pulse 75, temperature 98 F (36.7 C), temperature source Oral, resp. rate 20, SpO2 98.00%.   1. General middle aged [place] female lying in bed in NAD,   2. Normal affect and insight, Not Suicidal or Homicidal, Awake Alert, Oriented X 3.  3. Dense right-sided hemiparesis strength is 1/5 and right-sided extremities, 5/5 in the left, plantar down going on the  left  4. Ears and Eyes appear Normal, Conjunctivae clear, PERRLA. Moist Oral Mucosa.  5. Supple Neck, No JVD, No cervical lymphadenopathy appriciated, No Carotid Bruits.  6. Symmetrical Chest wall movement, Good air movement bilaterally, CTAB.  7. RRR, No Gallops, Rubs or Murmurs, No Parasternal Heave.  8. Positive Bowel Sounds, Abdomen Soft, Non tender, No organomegaly appriciated,No rebound -guarding or rigidity.  9.  No Cyanosis, Normal Skin Turgor, No Skin Rash or Bruise.  10. Good muscle tone,  joints appear normal , no effusions, Normal ROM.  11. No Palpable Lymph Nodes in Neck or Axillae     Data Review  CBC  Recent Labs Lab 06/10/13 1252 06/10/13 1302  WBC 7.1  --   HGB 13.4 13.9  HCT 40.9 41.0  PLT 199  --   MCV 87.4  --   MCH 28.6  --   MCHC 32.8  --   RDW 13.0  --   LYMPHSABS 2.9  --   MONOABS 0.5  --   EOSABS 0.1  --   BASOSABS 0.0  --    ------------------------------------------------------------------------------------------------------------------  Chemistries   Recent Labs Lab 06/10/13 1252 06/10/13 1302  NA 140 139  K 4.3 4.2  CL 100 102  CO2 29  --   GLUCOSE 94 96  BUN 24* 27*  CREATININE 0.95 1.30*  CALCIUM 10.0  --   AST 15  --   ALT 9  --   ALKPHOS 67  --   BILITOT 0.2*  --    ------------------------------------------------------------------------------------------------------------------ CrCl is unknown because both a height and weight (above a minimum accepted value) are required for this calculation. ------------------------------------------------------------------------------------------------------------------ No results found for this basename: TSH, T4TOTAL, FREET3, T3FREE, THYROIDAB,  in the last 72 hours   Coagulation profile  Recent Labs Lab 06/10/13 1252  INR 0.91   ------------------------------------------------------------------------------------------------------------------- No results found for this  basename: DDIMER,  in the last 72 hours -------------------------------------------------------------------------------------------------------------------  Cardiac Enzymes  Recent Labs Lab 06/10/13 1252  TROPONINI <0.30   ------------------------------------------------------------------------------------------------------------------ No components found with this basename: POCBNP,    ---------------------------------------------------------------------------------------------------------------  Urinalysis    Component Value Date/Time   COLORURINE YELLOW 05/10/2013 [place] 05/10/2013 1525   LABSPEC >1.030* 05/10/2013 1525   PHURINE 5.5 05/10/2013 1525   GLUCOSEU 250* 05/10/2013 1525   HGBUR MODERATE* 05/10/2013 1525   BILIRUBINUR SMALL* 05/10/2013 1525   KETONESUR NEGATIVE 05/10/2013 1525   PROTEINUR >300* 05/10/2013 1525   UROBILINOGEN 0.2 05/10/2013 1525   NITRITE NEGATIVE 05/10/2013 [place] 05/10/2013 1525    ----------------------------------------------------------------------------------------------------------------  Imaging results:   Ct Head Wo Contrast  06/10/2013   CLINICAL DATA:  62 year old female code stroke. Right side weakness and slurred speech. Last seen normal 2100 hrs.  EXAM: CT HEAD WITHOUT CONTRAST  TECHNIQUE: Contiguous axial images were obtained from the base of the skull through the vertex without intravenous contrast.  COMPARISON:  05/10/2013.  FINDINGS: Visualized paranasal sinuses and mastoids are clear. No acute osseous abnormality identified. Stable orbits and scalp soft tissues.  Chronic full amic and other deep gray matter nuclei lacunar infarcts re- identified. Patchy cerebral white matter hypodensity greater in the left hemisphere is stable. There are new small areas of cortical hypodensity in the left MCA territory without mass effect or hemorrhage (series 2, images 22, 20, 16). No other cortically based infarct  identified. No suspicious intracranial vascular hyperdensity. No midline shift, mass effect, or evidence of intracranial mass lesion. No ventriculomegaly.  IMPRESSION: 1. Scattered small acute to subacute cortically based infarcts in the left MCA territory. No mass effect or hemorrhage.  2. Underlying chronic small vessel ischemia.  Study discussed by telephone with Dr. Armida Sans on 06/10/2013 at 13:04 .   Electronically Signed   By: Lars Pinks M.D.   On: 06/10/2013 13:06    My personal review of EKG: Rhythm NSR, LVH pattern strain   Assessment & Plan   1. Left MCA to ischemic CVA - her history is suggestive of gradual decline in her mental status over the last several months specifically in the last 3 months, I wonder if she was having small ischemic CVAs throughout this period, her CT scan does show acute to subacute left MCA today CVA, she will be admitted on a telemetry bed, we will follow stroke protocol in order MRI MRA brain, carotid duplex, echogram, A1c, lipid panel, PT OT and speech eval, she will be placed on aspirin for now, stroke team will see the patient shortly.    2. Hypertension. Blood pressure is high we will allow for permissive hypertension in the light of acute CVA. As needed IV hydralazine with written parameters will be ordered.    3. Recently diagnosed diabetes mellitus type 2. We'll check A1c, for now every 4 hours sliding scale. We'll hold her Glucophage.     4. History of smoking, alcohol, crack cocaine abuse. She has quit all 3 months ago. Once back to her mental status she will be counseled to continue abstaining.     5. Mild history of present illness. Patient's recent outpatient lab work shows a creatinine of under 1, we will check her electrolytes, for now gentle hydration, avoid nephrotoxins. Repeat BMP in the morning.     6. LVH pattern strain on the EKG. She is chest pain palpitation and symptom free, this is likely due to long-standing uncontrolled  hypertension, echogram will be done we will evaluate wall motion and EF through that test.      DVT Prophylaxis Heparin    AM Labs Ordered, also please review Full Orders  Family Communication: Admission, patients condition and plan of care including tests being ordered have been discussed with the patient and daughter who indicate understanding and agree with the plan and Code Status.  Code Status DNR  Likely DC to  TBD  Condition Fair  Time spent in minutes : 35    Autumn Pruitt K M.D on 06/10/2013 at 2:16 PM  Between 7am to 7pm - Pager - [phone]  After 7pm go to [email or web address] - password [email or web address]  And look for the  night coverage person covering me after hours  [facility] Office  [phone]

## 2013-06-10 NOTE — ED Provider Notes (Signed)
CSN: [id number]     Arrival date & time 06/10/13  1239 History   First MD Initiated Contact with Patient 06/10/13 1302     Chief Complaint  Patient presents with  . Code Stroke    HPI  Patient was inserted emergency room for an evaluation of stroke symptoms. Patient was in her usual state of health last night. She was last seen normal approximately about 9 PM.  The patient woke up this morning she was having difficulty with her speech as well as moving the right side of her body. The patient is noted to have some difficulty with her speech emergency room and is having some difficulty moving her right side. She denies any complaints of headache or chest pain. She denies any abdominal pain or other acute complaints.  The code stroke was initiated and the patient was evaluated by the neurology service on arrival. Past Medical History  Diagnosis Date  . Hypertension   . Bronchitis   . Diabetes mellitus without complication   . Substance abuse     alcohol, but patient states she doesn't drink that much anymore   History reviewed. No pertinent past surgical history. No family history on file. History  Substance Use Topics  . Smoking status: Current Every Day Smoker -- 0.25 packs/day for 45 years    Types: Cigarettes  . Smokeless tobacco: Never Used  . Alcohol Use: 1.2 oz/week    2 Cans of beer per week     Comment: occasional   OB History   Grav Para Term Preterm Abortions TAB SAB Ect Mult Living   1 1 1       1      Review of Systems  All other systems reviewed and are negative.    Allergies  Review of patient's allergies indicates no known allergies.  Home Medications   No current outpatient prescriptions on file. BP 187/107  Pulse 56  Temp(Src) 98 F (36.7 C) (Oral)  Resp 18  SpO2 99% Physical Exam  Nursing note and vitals reviewed. Constitutional: She appears well-developed and well-nourished. No distress.  HENT:  Head: Normocephalic and atraumatic.  Right Ear:  External ear normal.  Left Ear: External ear normal.  Eyes: Conjunctivae are normal. Right eye exhibits no discharge. Left eye exhibits no discharge. No scleral icterus.  Neck: Neck supple. No tracheal deviation present.  Cardiovascular: Normal rate, regular rhythm and intact distal pulses.   Pulmonary/Chest: Effort normal and breath sounds normal. No stridor. No respiratory distress. She has no wheezes. She has no rales.  Abdominal: Soft. Bowel sounds are normal. She exhibits no distension. There is no tenderness. There is no rebound and no guarding.  Musculoskeletal: She exhibits no edema and no tenderness.  Neurological: She is alert. Cranial nerve deficit:  no gross defecits noted. She exhibits normal muscle tone. She displays no seizure activity.  Please see the stroke team evaluation for full neurologic exam, weakness noted in her right upper trauma knee and right lower extremity  Skin: Skin is warm and dry. No rash noted.  Psychiatric: She has a normal mood and affect.    ED Course  Procedures (including critical care time) EKG Normal sinus rhythm rate 60 Normal axis Normal intervals Probable left ventricular hypertrophy Inverted T waves diffusely No prior EKG for comparison  Labs Review Labs Reviewed  APTT - Abnormal; Notable for the following:    aPTT 21 (*)    All other components within normal limits  POCT I-STAT, CHEM 8 -  Abnormal; Notable for the following:    BUN 27 (*)    Creatinine, Ser 1.30 (*)    All other components within normal limits  PROTIME-INR  CBC  DIFFERENTIAL  GLUCOSE, CAPILLARY  ETHANOL  COMPREHENSIVE METABOLIC PANEL  TROPONIN I  URINE RAPID DRUG SCREEN (HOSP PERFORMED)  URINALYSIS, ROUTINE W REFLEX MICROSCOPIC  POCT I-STAT TROPONIN I   Imaging Review Ct Head Wo Contrast  06/10/2013   CLINICAL DATA:  62 year old female code stroke. Right side weakness and slurred speech. Last seen normal 2100 hrs.  EXAM: CT HEAD WITHOUT CONTRAST  TECHNIQUE:  Contiguous axial images were obtained from the base of the skull through the vertex without intravenous contrast.  COMPARISON:  05/10/2013.  FINDINGS: Visualized paranasal sinuses and mastoids are clear. No acute osseous abnormality identified. Stable orbits and scalp soft tissues.  Chronic full amic and other deep gray matter nuclei lacunar infarcts re- identified. Patchy cerebral white matter hypodensity greater in the left hemisphere is stable. There are new small areas of cortical hypodensity in the left MCA territory without mass effect or hemorrhage (series 2, images 22, 20, 16). No other cortically based infarct identified. No suspicious intracranial vascular hyperdensity. No midline shift, mass effect, or evidence of intracranial mass lesion. No ventriculomegaly.  IMPRESSION: 1. Scattered small acute to subacute cortically based infarcts in the left MCA territory. No mass effect or hemorrhage.  2. Underlying chronic small vessel ischemia.  Study discussed by telephone with Dr. Armida Sans on 06/10/2013 at 13:04 .   Electronically Signed   By: Lars Pinks M.D.   On: 06/10/2013 13:06    MDM   1. Stroke      Patient's CT scan suggests small subacute/acute infarction left MCA territory which would correlate with her exam findings. The patient is not a TPA candidate considering the last time she was seen normal. Patient will be admitted to the medical service. Neurology will be consulting.    Kathalene Frames, MD 06/10/13 [phone]

## 2013-06-10 NOTE — Code Documentation (Signed)
Code stroke called at 1217, Patient arrived to [facility] ED via EMS at 1239, [place] seen at 1239, stroke team at 1229, Patient in CT scan 1241.  LSN 06/09/2013 AT 2100.  Patient woke this am at 1100 with right side weakness and trouble speaking.  NIHSS 8.

## 2013-06-10 NOTE — ED Notes (Signed)
Upon arrival R sided weakness and slurred speech noted.

## 2013-06-10 NOTE — Progress Notes (Signed)
Notified by NT of BP 207/126, gave hydralazine, notified MD. No further orders

## 2013-06-10 NOTE — Consult Note (Signed)
Referring Physician: Dr. Hillard Danker    Chief Complaint: right sided weakness  HPI: JORGINA RODEZNO is an 62 y.o. female who LKW 2100 on 06/09/2013. Family noticed upon her awakening at 1100 on 06/10/2013 that she had right sided weakness, slurred speech and facial droop. En route CBG 107. BP 210/122.   Date last known well: Date: 06/09/2013 Time last known well: Time: 21:00 NIHSS=8  tPA Given: No: out of time window  Past Medical History  Diagnosis Date  . Hypertension   . Bronchitis   . Diabetes mellitus without complication   . Substance abuse     alcohol, but patient states she doesn't drink that much anymore    History reviewed. No pertinent past surgical history.  No family history on file. Social History:  reports that she has been smoking Cigarettes.  She has a 11.25 pack-year smoking history. She has never used smokeless tobacco. She reports that she drinks about 1.2 ounces of alcohol per week. She reports that she does not use illicit drugs.  Allergies: No Known Allergies   No current facility-administered medications for this encounter.   Current Outpatient Prescriptions  Medication Sig Dispense Refill  . hydrochlorothiazide (HYDRODIURIL) 25 MG tablet Take 25 mg by mouth daily.      . metFORMIN (GLUCOPHAGE) 500 MG tablet Take 500 mg by mouth 2 (two) times daily with a meal.        ROS: History obtained from chart review and EMS  General ROS: negative for - chills, fatigue, fever, night sweats, weight gain or weight loss Psychological ROS: negative for - behavioral disorder, hallucinations, , mood swings or suicidal ideation, ++++memory difficulties Ophthalmic ROS: negative for - blurry vision, double vision, eye pain or loss of vision ENT ROS: negative for - epistaxis, nasal discharge, oral lesions, sore throat, tinnitus or vertigo Allergy and Immunology ROS: negative for - hives or itchy/watery eyes Hematological and Lymphatic ROS: negative for - bleeding  problems, bruising or swollen lymph nodes Endocrine ROS: negative for - galactorrhea, hair pattern changes, polydipsia/polyuria or temperature intolerance Respiratory ROS: negative for - cough, hemoptysis, shortness of breath or wheezing Cardiovascular ROS: negative for - chest pain, dyspnea on exertion, edema or irregular heartbeat Gastrointestinal ROS: negative for - abdominal pain, diarrhea, hematemesis, nausea/vomiting or stool incontinence Genito-Urinary ROS: negative for - dysuria, hematuria, incontinence or urinary frequency/urgency Musculoskeletal ROS: negative for - joint swelling or muscular weakness Neurological ROS: as noted in HPI Dermatological ROS: negative for rash and skin lesion changes   Physical Examination: Blood pressure 199/104, pulse 75, temperature 98 F (36.7 C), temperature source Oral, resp. rate 20, SpO2 98.00%.  Neurologic Examination: Mental Status: Alert, oriented, thought content appropriate.  Speech fluent without evidence of aphasia.  Able to follow 3 step commands without difficulty. Cranial Nerves: II: visual fields grossly normal, pupils equal, round, reactive to light and accommodation III,IV, VI: ptosis present, right 3rd nerve palsy (had entire life per family) V,VII: smile symmetric, facial light touch sensation normal bilaterally VIII: hearing normal bilaterally IX,X: gag reflex present XI: trapezius strength/neck flexion strength normal bilaterally XII: tongue strength normal  Motor: Right : Upper extremity   3/5    Left:     Upper extremity   5/5  Lower extremity   3/5     Lower extremity   5/5 Tone and bulk:normal tone throughout; no atrophy noted Sensory: Pinprick and light touch intact throughout, bilaterally Deep Tendon Reflexes: 2+ and symmetric throughout Plantars: Right: downgoing   Left: downgoing  Cerebellar: normal finger-to-nose left, could not perform on right due to weakness.    Results for orders placed during the  hospital encounter of 06/10/13 (from the past 48 hour(s))  ETHANOL     Status: None   Collection Time    06/10/13 12:52 PM      Result Value Range   Alcohol, Ethyl (B) <11  0 - 11 mg/dL   Comment:            LOWEST DETECTABLE LIMIT FOR     SERUM ALCOHOL IS 11 mg/dL     FOR MEDICAL PURPOSES ONLY  PROTIME-INR     Status: None   Collection Time    06/10/13 12:52 PM      Result Value Range   Prothrombin Time 12.1  11.6 - 15.2 seconds   INR 0.91  0.00 - 1.49  APTT     Status: Abnormal   Collection Time    06/10/13 12:52 PM      Result Value Range   aPTT 21 (*) 24 - 37 seconds  CBC     Status: None   Collection Time    06/10/13 12:52 PM      Result Value Range   WBC 7.1  4.0 - 10.5 K/uL   RBC 4.68  3.87 - 5.11 MIL/uL   Hemoglobin 13.4  12.0 - 15.0 g/dL   HCT 40.9  36.0 - 46.0 %   MCV 87.4  78.0 - 100.0 fL   MCH 28.6  26.0 - 34.0 pg   MCHC 32.8  30.0 - 36.0 g/dL   RDW 13.0  11.5 - 15.5 %   Platelets 199  150 - 400 K/uL  DIFFERENTIAL     Status: None   Collection Time    06/10/13 12:52 PM      Result Value Range   Neutrophils Relative % 51  43 - 77 %   Neutro Abs 3.6  1.7 - 7.7 K/uL   Lymphocytes Relative 41  12 - 46 %   Lymphs Abs 2.9  0.7 - 4.0 K/uL   Monocytes Relative 7  3 - 12 %   Monocytes Absolute 0.5  0.1 - 1.0 K/uL   Eosinophils Relative 1  0 - 5 %   Eosinophils Absolute 0.1  0.0 - 0.7 K/uL   Basophils Relative 0  0 - 1 %   Basophils Absolute 0.0  0.0 - 0.1 K/uL  COMPREHENSIVE METABOLIC PANEL     Status: Abnormal   Collection Time    06/10/13 12:52 PM      Result Value Range   Sodium 140  135 - 145 mEq/L   Potassium 4.3  3.5 - 5.1 mEq/L   Chloride 100  96 - 112 mEq/L   CO2 29  19 - 32 mEq/L   Glucose, Bld 94  70 - 99 mg/dL   BUN 24 (*) 6 - 23 mg/dL   Creatinine, Ser 0.95  0.50 - 1.10 mg/dL   Calcium 10.0  8.4 - 10.5 mg/dL   Total Protein 7.1  6.0 - 8.3 g/dL   Albumin 3.2 (*) 3.5 - 5.2 g/dL   AST 15  0 - 37 U/L   ALT 9  0 - 35 U/L   Alkaline Phosphatase  67  39 - 117 U/L   Total Bilirubin 0.2 (*) 0.3 - 1.2 mg/dL   GFR calc non Af Amer 63 (*) >90 mL/min   GFR calc Af Amer 73 (*) >90 mL/min   Comment: (  NOTE)     The eGFR has been calculated using the CKD EPI equation.     This calculation has not been validated in all clinical situations.     eGFR's persistently <90 mL/min signify possible Chronic Kidney     Disease.  TROPONIN I     Status: None   Collection Time    06/10/13 12:52 PM      Result Value Range   Troponin I <0.30  <0.30 ng/mL   Comment:            Due to the release kinetics of cTnI,     a negative result within the first hours     of the onset of symptoms does not rule out     myocardial infarction with certainty.     If myocardial infarction is still suspected,     repeat the test at appropriate intervals.  POCT I-STAT TROPONIN I     Status: None   Collection Time    06/10/13  1:00 PM      Result Value Range   Troponin i, poc 0.01  0.00 - 0.08 ng/mL   Comment 3            Comment: Due to the release kinetics of cTnI,     a negative result within the first hours     of the onset of symptoms does not rule out     myocardial infarction with certainty.     If myocardial infarction is still suspected,     repeat the test at appropriate intervals.  POCT I-STAT, CHEM 8     Status: Abnormal   Collection Time    06/10/13  1:02 PM      Result Value Range   Sodium 139  135 - 145 mEq/L   Potassium 4.2  3.5 - 5.1 mEq/L   Chloride 102  96 - 112 mEq/L   BUN 27 (*) 6 - 23 mg/dL   Creatinine, Ser 1.30 (*) 0.50 - 1.10 mg/dL   Glucose, Bld 96  70 - 99 mg/dL   Calcium, Ion 1.16  1.13 - 1.30 mmol/L   TCO2 28  0 - 100 mmol/L   Hemoglobin 13.9  12.0 - 15.0 g/dL   HCT 41.0  36.0 - 46.0 %  GLUCOSE, CAPILLARY     Status: None   Collection Time    06/10/13  1:12 PM      Result Value Range   Glucose-Capillary 85  70 - 99 mg/dL   Ct Head Wo Contrast 06/10/2013  1. Scattered small acute to subacute cortically based infarcts in the  left MCA territory. No mass effect or hemorrhage.  2. Underlying chronic small vessel ischemia.    Assessment: 62 y.o. female with L MCA stroke. LKW 14 hours prior to arrival. Have contacted research for trials. Patient will be followed by stroke team and full workup as below. Orders have been implemented by primary team.  Stroke Risk Factors - diabetes mellitus, hypertension and smoking  Plan: 1. HgbA1c, fasting lipid panel 2. MRI, MRA  of the brain without contrast 3. PT consult, OT consult, Speech consult 4. Echocardiogram 5. Carotid dopplers 6. Prophylactic therapy-Antiplatelet med: Aspirin - dose 325mg  7. Risk factor modification 8. Telemetry monitoring 9. Frequent neuro checks  Regenia Skeeter. Owens Shark, [facility], [place], [facility] Pager: [phone] 06/10/2013 2:39 PM  Patient seen and examined together with physician assistant and I concur with the assessment and plan.  Dorian Pod, MD

## 2013-06-10 NOTE — ED Notes (Signed)
CBG 85

## 2013-06-10 NOTE — ED Notes (Addendum)
Pt presents to department via [facility] for evaluation of CODE STROKE. Family states she was acting normal last night @ 9:00pm. States she woke up this morning and was having trouble speaking and moving R side of body. Upon arrival R sided weakness and delayed responses noted. 20g LAC. CBG 107.

## 2013-06-11 DIAGNOSIS — R413 Other amnesia: Secondary | ICD-10-CM

## 2013-06-11 LAB — HEMOGLOBIN A1C: Hgb A1c MFr Bld: 7 % — ABNORMAL HIGH (ref <5.7)

## 2013-06-11 LAB — BASIC METABOLIC PANEL
BUN: 20 mg/dL (ref 6–23)
Calcium: 9.3 mg/dL (ref 8.4–10.5)
Creatinine, Ser: 0.87 mg/dL (ref 0.50–1.10)
GFR calc Af Amer: 82 mL/min — ABNORMAL LOW (ref >90)
GFR calc non Af Amer: 70 mL/min — ABNORMAL LOW (ref >90)
Potassium: 3.7 mEq/L (ref 3.5–5.1)

## 2013-06-11 LAB — URINE MICROSCOPIC-ADD ON

## 2013-06-11 LAB — RPR: RPR Ser Ql: NONREACTIVE

## 2013-06-11 LAB — GLUCOSE, CAPILLARY
Glucose-Capillary: 96 mg/dL (ref 70–99)
Glucose-Capillary: 117 mg/dL — ABNORMAL HIGH (ref 70–99)
Glucose-Capillary: 88 mg/dL (ref 70–99)

## 2013-06-11 LAB — CBC
MCHC: 32.3 g/dL (ref 30.0–36.0)
Platelets: 231 10*3/uL (ref 150–400)
RDW: 13 % (ref 11.5–15.5)

## 2013-06-11 LAB — URINALYSIS, ROUTINE W REFLEX MICROSCOPIC
Bilirubin Urine: NEGATIVE
Ketones, ur: NEGATIVE mg/dL
Nitrite: NEGATIVE
Specific Gravity, Urine: 1.011 (ref 1.005–1.030)
Urobilinogen, UA: 0.2 mg/dL (ref 0.0–1.0)

## 2013-06-11 LAB — RAPID URINE DRUG SCREEN, HOSP PERFORMED
Barbiturates: NOT DETECTED
Benzodiazepines: NOT DETECTED
Cocaine: NOT DETECTED
Tetrahydrocannabinol: NOT DETECTED

## 2013-06-11 LAB — LIPID PANEL: Cholesterol: 230 mg/dL — ABNORMAL HIGH (ref 0–200)

## 2013-06-11 MED ORDER — SIMVASTATIN 20 MG PO TABS
20.0000 mg | ORAL_TABLET | Freq: Every day | ORAL | Status: DC
  Administered 2013-06-11 – 2013-06-13 (×3): 20 mg via ORAL
  Filled 2013-06-11 (×4): qty 1

## 2013-06-11 MED ORDER — LEVOFLOXACIN IN D5W 500 MG/100ML IV SOLN
500.0000 mg | INTRAVENOUS | Status: DC
  Administered 2013-06-11 – 2013-06-12 (×2): 500 mg via INTRAVENOUS
  Filled 2013-06-11 (×3): qty 100

## 2013-06-11 MED ORDER — AMLODIPINE BESYLATE 5 MG PO TABS
5.0000 mg | ORAL_TABLET | Freq: Every day | ORAL | Status: DC
  Administered 2013-06-11 – 2013-06-13 (×3): 5 mg via ORAL
  Filled 2013-06-11 (×3): qty 1

## 2013-06-11 MED ORDER — METRONIDAZOLE 500 MG PO TABS
500.0000 mg | ORAL_TABLET | Freq: Two times a day (BID) | ORAL | Status: DC
  Administered 2013-06-11 – 2013-06-14 (×7): 500 mg via ORAL
  Filled 2013-06-11 (×9): qty 1

## 2013-06-11 NOTE — Progress Notes (Signed)
VASCULAR LAB PRELIMINARY  PRELIMINARY  PRELIMINARY  PRELIMINARY  Carotid Dopplers completed.    Preliminary report:  There is 1-39% ICA stenosis.  Vertebral artery flow is antegrade.  Zakyla Tonche, RVT 06/11/2013, 10:11 AM

## 2013-06-11 NOTE — Progress Notes (Signed)
Stroke Team Progress Note  HISTORY Kathryn Cook is an 62 y.o. female who LKW 2100 on 06/09/2013. Family noticed upon her awakening at 1100 on 06/10/2013 that she had right sided weakness, slurred speech and facial droop. En route CBG 107. BP 210/122. NIHSS=8. Patient was not a TPA candidate secondary to delay in arrival. She was admitted for further evaluation and treatment.  SUBJECTIVE Her sisters are at the bedside.  Overall she feels her condition is "I don't know", ask my sisters.   OBJECTIVE Most recent Vital Signs: Filed Vitals:   06/11/13 0136 06/11/13 0500 06/11/13 0605 06/11/13 1038  BP: 178/92  187/100 177/116  Pulse: 67  80 72  Temp: 98.2 F (36.8 C)  98.5 F (36.9 C) 98.6 F (37 C)  TempSrc: Oral  Oral Oral  Resp: 16  18 18   Height:      Weight:  96.5 kg (212 lb 11.9 oz)    SpO2: 99%  98% 96%   CBG (last 3)   Recent Labs  06/10/13 2020 06/11/13 0636 06/11/13 1131  GLUCAP 173* 96 117*    IV Fluid Intake:   . sodium chloride 75 mL/hr at 06/11/13 0320    MEDICATIONS  . amLODipine  5 mg Oral Daily  . aspirin  300 mg Rectal Daily   Or  . aspirin  325 mg Oral Daily  . heparin  5,000 Units Subcutaneous Q8H  . insulin aspart  0-9 Units Subcutaneous TID WC  . levofloxacin (LEVAQUIN) IV  500 mg Intravenous Q24H  . metroNIDAZOLE  500 mg Oral Q12H  . simvastatin  20 mg Oral q1800  . sodium chloride  3 mL Intravenous Q12H   PRN:  albuterol, guaiFENesin-dextromethorphan, hydrALAZINE, HYDROcodone-acetaminophen, ondansetron (ZOFRAN) IV, ondansetron, polyethylene glycol  Diet:  Carb Control thin liquids Activity:  Up with assistance DVT Prophylaxis:  Heparin 5000 units sq tid   CLINICALLY SIGNIFICANT STUDIES Basic Metabolic Panel:  Recent Labs Lab 06/10/13 1252 06/10/13 1302 06/11/13 0550  NA 140 139 141  K 4.3 4.2 3.7  CL 100 102 104  CO2 29  --  28  GLUCOSE 94 96 98  BUN 24* 27* 20  CREATININE 0.95 1.30* 0.87  CALCIUM 10.0  --  9.3   Liver  Function Tests:  Recent Labs Lab 06/10/13 1252  AST 15  ALT 9  ALKPHOS 67  BILITOT 0.2*  PROT 7.1  ALBUMIN 3.2*   CBC:  Recent Labs Lab 06/10/13 1252 06/10/13 1302 06/11/13 0550  WBC 7.1  --  6.8  NEUTROABS 3.6  --   --   HGB 13.4 13.9 12.3  HCT 40.9 41.0 38.1  MCV 87.4  --  87.6  PLT 199  --  231   Coagulation:  Recent Labs Lab 06/10/13 1252  LABPROT 12.1  INR 0.91   Cardiac Enzymes:  Recent Labs Lab 06/10/13 1252  TROPONINI <0.30   Urinalysis:  Recent Labs Lab 06/11/13 0303  COLORURINE YELLOW  LABSPEC 1.011  PHURINE 7.0  GLUCOSEU NEGATIVE  HGBUR MODERATE*  BILIRUBINUR NEGATIVE  KETONESUR NEGATIVE  PROTEINUR 100*  UROBILINOGEN 0.2  NITRITE NEGATIVE  LEUKOCYTESUR LARGE*   Lipid Panel    Component Value Date/Time   CHOL 230* 06/11/2013 0550   TRIG 58 06/11/2013 0550   HDL 94 06/11/2013 0550   CHOLHDL 2.4 06/11/2013 0550   VLDL 12 06/11/2013 0550   LDLCALC 124* 06/11/2013 0550   HgbA1C  Lab Results  Component Value Date   HGBA1C 7.1* 05/31/2013  Urine Drug Screen:     Component Value Date/Time   LABOPIA NONE DETECTED 06/11/2013 0303   COCAINSCRNUR NONE DETECTED 06/11/2013 0303   LABBENZ NONE DETECTED 06/11/2013 0303   AMPHETMU NONE DETECTED 06/11/2013 0303   THCU NONE DETECTED 06/11/2013 0303   LABBARB NONE DETECTED 06/11/2013 0303    Alcohol Level:  Recent Labs Lab 06/10/13 1252  ETH <11    CT of the brain  06/10/2013   1. Scattered small acute to subacute cortically based infarcts in the left MCA territory. No mass effect or hemorrhage.  2. Underlying chronic small vessel ischemia.    MRI of the brain  06/10/2013   1.  Acute non hemorrhagic punctate infarcts of the left thalamus, internal capsule, and insular cortex. 2.  Acute non hemorrhagic punctate infarcts of the right internal capsule. 3.  Atrophy and extensive white matter disease is advanced for age. 4.  Remote lacunar infarcts of the thalami and basal ganglia bilaterally.    MRA of  the brain  06/10/2013    1.  Moderate small vessel disease. 2.  Moderate stenosis of the right P1 segment. 3.  Atherosclerotic changes within the cavernous carotid arteries bilaterally as well as the right vertebral artery.    2D Echocardiogram    Carotid Doppler    CXR  06/10/2013    No active cardiopulmonary disease.   EKG  Sinus rhythm.   Therapy Recommendations   Physical Exam   Pleasant african american lady sitting up in bedside chair.Awake alert. Afebrile. Head is nontraumatic. Neck is supple without bruit. Hearing is normal. Cardiac exam no murmur or gallop. Lungs are clear to auscultation. Distal pulses are well felt.   Neurological Exam ; awake alert oriented x 2 diminished attention, registration and recall. Poor short term memory recall 0/3. Decrease animal naming. Follow 1 step commands only.Fundi not visualized. Vision acuity and fields seem normal. Mild right lower face weakness. Tongue midline. No drift mild right grip weakness. No leg weakness. Mild right hip flexor weakness. Sensation, coordination normal. Gait deferred. ASSESSMENT Kathryn Cook is a 62 y.o. female presenting with right sided weakness. Imaging confirms  left thalamus, internal capsule, and insular cortex infarcts as well as right internal capsule infarcts. Infarcts felt to be thrombotic secondary to small vessel disease given location and uncontrolled risk factors.  On no antithrombotics prior to admission. Now on aspirin 325 mg orally every day for secondary stroke prevention. Patient with resultant right hemiparesis. Work up underway.   Baseline dementia Hyperlipidemia, LDL 124, on no statin PTA, now on zocor 20, goal LDL < 100 (< 70 for diabetics) Diabetes, HgbA1c 7.1, goal < 7.0  Hospital day # 1  TREATMENT/PLAN  Continue aspirin 325 mg orally every day for secondary stroke prevention.  F/u 2D and carotid dopplers  Social work consult for placement at family request  D/W sisters x 2 and  answered questions  Burnetta Sabin, MSN, RN, ANVP-BC, ANP-BC, [facility] Pager: [phone] 06/11/2013 12:01 PM  I have personally obtained a history, examined the patient, evaluated imaging results, and formulated the assessment and plan of care. I agree with the above. Antony Contras, MD

## 2013-06-11 NOTE — Evaluation (Addendum)
Physical Therapy Evaluation Patient Details Name: Kathryn Cook MRN: [id number] DOB: [birth year] Today's Date: 06/11/2013 Time: [id number] PT Time Calculation (min): 18 min  PT Assessment / Plan / Recommendation History of Present Illness  62 y.o. female, with recently diagnosed hypertension and diabetes mellitus, who saw a physician for the first time few weeks ago, prolonged history of smoking, alcohol and recreational drug abuse including crack cocaine, quit all substance abuse and smoking about 3 months ago, also carries a diagnosis of early dementia (question if this is vascular dementia secondary to multiple occult strokes) , patient with above history who lives with her daughter was brought in by family members after she was noticed to have extreme right-sided weakness this morning, according to the daughter patient was doing great last night and was seen in her usual state of health around 8:30 PM yesterday, this morning when family members went to wake up the patient around 8:30 in the morning she was found to have right-sided weakness and then brought to the ER. In the ER workup was suggestive of left MCA territory CVA which was ischemic in nature, she was found to be outside TPA window, stroke team was consulted who requested hospitalist admission.  Clinical Impression  Pt admitted with above. Pt currently with functional limitations due to the deficits listed below (see PT Problem List).  Pt will benefit from skilled PT to increase their independence and safety with mobility to allow discharge to the venue listed below.  Pt only orientated to self however able to recall she was at [facility] when asked again.  Pt with no family in room to determine baseline.  Pt would benefit from 24/7 assist upon d/c for safety, so recommend SNF at this time.  Evaluation limited today by BP. Please see vitals below.     PT Assessment  Patient needs continued PT services    Follow Up  Recommendations  Supervision/Assistance - 24 hour;SNF    Does the patient have the potential to tolerate intense rehabilitation      Barriers to Discharge        Equipment Recommendations  Rolling walker with 5" wheels (needs to be further assessed, unknown home DME)    Recommendations for Other Services     Frequency Min 3X/week    Precautions / Restrictions Precautions Precautions: Fall Precaution Comments: montior BP   Pertinent Vitals/Pain Pt sitting in recliner on arrival: 149/80 mmHg 88 bpm After using bathroom and assisted back to sitting EOB: 190/102 mmHg 87 bpm Upon return to supine: 198/113 mmHg 86 bpm RN notified.  Pt denies pain.    Mobility  Bed Mobility Bed Mobility: Sit to Supine Sit to Supine: 5: Supervision;HOB flat Transfers Transfers: Sit to Stand;Stand to Sit Sit to Stand: 4: Min guard;With armrests;With upper extremity assist;From chair/3-in-1 Stand to Sit: 4: Min guard;To chair/3-in-1;To bed;With upper extremity assist Details for Transfer Assistance: verbal cues for safe technique including hand placement Ambulation/Gait Ambulation/Gait Assistance: 4: Min assist Ambulation Distance (Feet): 15 Feet (total) Assistive device: 1 person hand held assist Ambulation/Gait Assistance Details: pt ambulated to/from bathroom, limited distance to room only due to high BP this morning, 1 HHA to steady into bathroom and pt used furniture/wall to ambulate back to bed Gait Pattern: Step-through pattern;Wide base of support;Decreased stride length Modified Rankin (Stroke Patients Only) Pre-Morbid Rankin Score: Moderate disability Modified Rankin: Moderately severe disability    Exercises     PT Diagnosis: Difficulty walking;Generalized weakness  PT Problem List: Decreased strength;Decreased  balance;Decreased activity tolerance;Decreased mobility;Decreased safety awareness;Decreased knowledge of use of DME PT Treatment Interventions: DME instruction;Gait  training;Functional mobility training;Therapeutic activities;Therapeutic exercise;Neuromuscular re-education;Balance training;Patient/family education     PT Goals(Current goals can be found in the care plan section) Acute Rehab PT Goals PT Goal Formulation: With patient Time For Goal Achievement: 06/25/13 Potential to Achieve Goals: Good  Visit Information  Last PT Received On: 06/11/13 Assistance Needed: +1 History of Present Illness: 62 y.o. female, with recently diagnosed hypertension and diabetes mellitus, who saw a physician for the first time few weeks ago, prolonged history of smoking, alcohol and recreational drug abuse including crack cocaine, quit all substance abuse and smoking about 3 months ago, also carries a diagnosis of early dementia (question if this is vascular dementia secondary to multiple occult strokes) , patient with above history who lives with her daughter was brought in by family members after she was noticed to have extreme right-sided weakness this morning, according to the daughter patient was doing great last night and was seen in her usual state of health around 8:30 PM yesterday, this morning when family members went to wake up the patient around 8:30 in the morning she was found to have right-sided weakness and then brought to the ER. In the ER workup was suggestive of left MCA territory CVA which was ischemic in nature, she was found to be outside TPA window, stroke team was consulted who requested hospitalist admission.       Prior [place] expects to be discharged to:: Private residence Living Arrangements: Other relatives (sister) Type of Home: House Home Access: Stairs to enter Technical brewer of Steps: pt unable to recall Home Equipment: None Additional Comments: pt poor historian Prior Function Comments: pt reports independent ambulation however appears to be poor historian Communication Communication: No  difficulties    Cognition  Cognition Arousal/Alertness: Awake/alert Behavior During Therapy: WFL for tasks assessed/performed Overall Cognitive Status: No family/caregiver present to determine baseline cognitive functioning Area of Impairment: Orientation Orientation Level: Place;Time;Situation    Extremity/Trunk Assessment Lower Extremity Assessment Lower Extremity Assessment: Generalized weakness (at least 3/5 throughout, did not MMT due to high BP)   Balance    End of Session PT - End of Session Equipment Utilized During Treatment: Gait belt Activity Tolerance: Other (comment) (limited by hypertension) Patient left: in bed;with call bell/phone within reach;with bed alarm set Nurse Communication:  (high BP)  GP     Makinna Andy,[name] 06/11/2013, 2:56 PM Carmelia Bake, PT, DPT 06/11/2013 Pager: [phone]

## 2013-06-11 NOTE — Evaluation (Signed)
Occupational Therapy Evaluation Patient Details Name: TAMMEY TUNNEY MRN: [id number] DOB: Jul 06, 1951 Today's Date: 06/11/2013 Time: [id number] OT Time Calculation (min): 16 min  OT Assessment / Plan / Recommendation History of present illness 62 y.o. female, with recently diagnosed hypertension and diabetes mellitus, who saw a physician for the first time few weeks ago, prolonged history of smoking, alcohol and recreational drug abuse including crack cocaine, quit all substance abuse and smoking about 3 months ago, also carries a diagnosis of early dementia (question if this is vascular dementia secondary to multiple occult strokes) , patient with above history who lives with her daughter was brought in by family members after she was noticed to have extreme right-sided weakness this morning, according to the daughter patient was doing great last night and was seen in her usual state of health around 8:30 PM yesterday, this morning when family members went to wake up the patient around 8:30 in the morning she was found to have right-sided weakness and then brought to the ER. In the ER workup was suggestive of left MCA territory CVA which was ischemic in nature, she was found to be outside TPA window, stroke team was consulted who requested hospitalist admission.   Clinical Impression    Pt admitted with above. Pt currently with functional limitations due to the deficits listed below (see OT Problem List). Pt will benefit from skilled OT to increase their independence and safety to allow discharge to the venue listed below. Pt only orientated to self however able to recall she was at [facility] when asked again. Pt with no family in room to determine baseline. Pt would benefit from 24/7 assist upon d/c for safety, so recommend SNF at this time. Evaluation limited today by BP. Please see vitals below.   OT Assessment  Patient needs continued OT Services    Follow Up Recommendations  SNF;Supervision/Assistance - 24 hour    Barriers to Discharge      Equipment Recommendations  Other (comment) (tbd)    Recommendations for Other Services    Frequency  Min 2X/week    Precautions / Restrictions Precautions Precautions: Fall Precaution Comments: montior BP   Pertinent Vitals/Pain Pt sitting in recliner on arrival: 149/80 mmHg 88 bpm  After using bathroom and assisted back to sitting EOB: 190/102 mmHg 87 bpm  Upon return to supine: 198/113 mmHg 86 bpm  RN notified.  Pt denies pain.      ADL  Grooming: Performed;Wash/dry hands;Min guard Where Assessed - Grooming: Unsupported standing Upper Body Dressing: Minimal assistance (due to cognition?) Where Assessed - Upper Body Dressing: Unsupported sitting Lower Body Dressing: Minimal assistance Where Assessed - Lower Body Dressing: Supported sit to stand Toilet Transfer: Magazine features editor Method: Sit to Loss adjuster, chartered: Raised toilet seat with arms (or 3-in-1 over toilet) Toileting - Clothing Manipulation and Hygiene: Min guard Where Assessed - Toileting Clothing Manipulation and Hygiene: Sit to stand from 3-in-1 or toilet Tub/Shower Transfer Method: Not assessed Equipment Used: Gait belt Transfers/Ambulation Related to ADLs: Min A for ambulation-hand held assist. Min guard for transfers. ADL Comments: Pt ambulated to bathroom and performed toileting tasks and washed hands at sink. Pt doffed socks, and attempted to don one sock, but did not complete task.     OT Diagnosis: Generalized weakness;Cognitive deficits  OT Problem List: Decreased strength;Decreased activity tolerance;Impaired balance (sitting and/or standing);Decreased cognition;Decreased knowledge of precautions;Decreased knowledge of use of DME or AE;Decreased safety awareness OT Treatment Interventions: Self-care/ADL training;DME and/or AE  instruction;Balance training;Patient/family education;Cognitive  remediation/compensation;Therapeutic activities;Neuromuscular education   OT Goals(Current goals can be found in the care plan section) Acute Rehab OT Goals Patient Stated Goal: not stated OT Goal Formulation: Patient unable to participate in goal setting Time For Goal Achievement: 06/18/13 Potential to Achieve Goals: Good ADL Goals Pt Will Perform Grooming: with modified independence;standing Pt Will Perform Upper Body Dressing: sitting;with modified independence Pt Will Perform Lower Body Dressing: with supervision;sit to/from stand Pt Will Transfer to Toilet: with modified independence;ambulating (3 in1 over commode) Pt Will Perform Toileting - Clothing Manipulation and hygiene: with modified independence;sit to/from stand  Visit Information  Last OT Received On: 06/11/13 Assistance Needed: +1 PT/OT Co-Evaluation/Treatment: Yes History of Present Illness: 62 y.o. female, with recently diagnosed hypertension and diabetes mellitus, who saw a physician for the first time few weeks ago, prolonged history of smoking, alcohol and recreational drug abuse including crack cocaine, quit all substance abuse and smoking about 3 months ago, also carries a diagnosis of early dementia (question if this is vascular dementia secondary to multiple occult strokes) , patient with above history who lives with her daughter was brought in by family members after she was noticed to have extreme right-sided weakness this morning, according to the daughter patient was doing great last night and was seen in her usual state of health around 8:30 PM yesterday, this morning when family members went to wake up the patient around 8:30 in the morning she was found to have right-sided weakness and then brought to the ER. In the ER workup was suggestive of left MCA territory CVA which was ischemic in nature, she was found to be outside TPA window, stroke team was consulted who requested hospitalist admission.       Prior  [place] expects to be discharged to:: Private residence Living Arrangements: Other relatives (sister) Type of Home: House Home Access: Stairs to enter Technical brewer of Steps: pt unable to recall Home Equipment: None Additional Comments: pt poor historian Prior Function Comments: pt reports independent ambulation however appears to be poor historian Communication Communication: No difficulties         Vision/Perception     Cognition  Cognition Arousal/Alertness: Awake/alert Behavior During Therapy: WFL for tasks assessed/performed Overall Cognitive Status: No family/caregiver present to determine baseline cognitive functioning Area of Impairment: Orientation;Attention Orientation Level: Place;Time;Situation Current Attention Level:  (pt not completing donning sock-may be due to frustration or due to decreased attention)    Extremity/Trunk Assessment Upper Extremity Assessment Upper Extremity Assessment: RUE deficits/detail RUE Deficits / Details: RUE somewhat weaker than LUE, but WFL Lower Extremity Assessment Lower Extremity Assessment: Generalized weakness (at least 3/5 throughout, did not MMT due to high BP)     Mobility Bed Mobility Bed Mobility: Sit to Supine;Scooting to HOB Sit to Supine: 5: Supervision;HOB flat Scooting to HOB: 4: Min assist;With rail Details for Bed Mobility Assistance: Min A to scoot to [facility]. Cues to use rails. Transfers Transfers: Sit to Stand;Stand to Sit Sit to Stand: 4: Min guard;With armrests;With upper extremity assist;From chair/3-in-1 Stand to Sit: 4: Min guard;To chair/3-in-1;To bed;With upper extremity assist Details for Transfer Assistance: verbal cues for safe technique including hand placement     Exercise     Balance     End of Session OT - End of Session Equipment Utilized During Treatment: Gait belt Activity Tolerance: Other (comment) (pt limited by hypertension) Patient  left: in bed;with call bell/phone within reach;with bed alarm set Nurse  Communication: Other (comment) (high BP)  GO     Benito Mccreedy OTR/L [id number] 06/11/2013, 3:14 PM

## 2013-06-11 NOTE — Progress Notes (Signed)
Patient disoriented to time, could not identify what month it is, informed RN and will continue to monitor

## 2013-06-11 NOTE — Progress Notes (Signed)
  Echocardiogram 2D Echocardiogram has been performed.  [place], [place] 06/11/2013, 11:06 AM

## 2013-06-11 NOTE — Progress Notes (Signed)
[facility] PROGRESS NOTE  Kathryn Cook [id number] DOB: 09-May-1951 DOA: 06/10/2013 PCP: Sandi Mealy, MD  Assessment/Plan: 62 y.o. female, with recently diagnosed hypertension and diabetes mellitus, who saw a physician for the first time few weeks ago, prolonged history of smoking, alcohol and recreational drug abuse including crack cocaine, quit all substance abuse and smoking about 3 months ago, also carries a diagnosis of early dementia (question if this is vascular dementia secondary to multiple occult strokes) , patient with above history who lives with her daughter was brought in by family members after she was noticed to have extreme right-sided weakness, facial droop    1. Acute CVA; right sided weakness, slurred speech and facial droop -MRI: punctate infarcts of the left thalamus, internal capsule, and insular cortex. right internal capsule. Atrophy and extensive white matter disease; remote infarcts  -clinically better; cont ASA, statin, pend echo, doppler   2. HTN allow permissive HTN; not on meds at home;  -will start amlodipine   3. UTI; start atx-levoflox; f/u c/s;  -pos for trichomonas; start metronidazole, need std screen;    4. Dementia; likely baseline confusion;   5. DM recent Dx; HA1C-7.1 -cont ISS; hold metformin;     Code Status: dnr  Family Communication: not present at the bedside  (indicate person spoken with, relationship, and if by phone, the number) Disposition Plan: pend pt eval    Consultants:  Neurology   Procedures:  MRI   Antibiotics:  levofloxxcain 9/15<<< Metronidazole 9/15<<<  HPI/Subjective: Alert, confused   Objective: Filed Vitals:   06/11/13 0605  BP: 187/100  Pulse: 80  Temp: 98.5 F (36.9 C)  Resp: 18   No intake or output data in the 24 hours ending 06/11/13 0914 Filed Weights   06/10/13 1500 06/11/13 0500  Weight: 92.2 kg (203 lb 4.2 oz) 96.5 kg (212 lb 11.9 oz)    Exam:   General:  Alert    Cardiovascular: s1, s2 rrr  Respiratory: cta BL   Abdomen: soft, NT, ND   Musculoskeletal: no LE  edema    Data Reviewed: Basic Metabolic Panel:  Recent Labs Lab 06/10/13 1252 06/10/13 1302 06/11/13 0550  NA 140 139 141  K 4.3 4.2 3.7  CL 100 102 104  CO2 29  --  28  GLUCOSE 94 96 98  BUN 24* 27* 20  CREATININE 0.95 1.30* 0.87  CALCIUM 10.0  --  9.3   Liver Function Tests:  Recent Labs Lab 06/10/13 1252  AST 15  ALT 9  ALKPHOS 67  BILITOT 0.2*  PROT 7.1  ALBUMIN 3.2*   No results found for this basename: LIPASE, AMYLASE,  in the last 168 hours No results found for this basename: AMMONIA,  in the last 168 hours CBC:  Recent Labs Lab 06/10/13 1252 06/10/13 1302 06/11/13 0550  WBC 7.1  --  6.8  NEUTROABS 3.6  --   --   HGB 13.4 13.9 12.3  HCT 40.9 41.0 38.1  MCV 87.4  --  87.6  PLT 199  --  231   Cardiac Enzymes:  Recent Labs Lab 06/10/13 1252  TROPONINI <0.30   BNP (last 3 results) No results found for this basename: PROBNP,  in the last 8760 hours CBG:  Recent Labs Lab 06/10/13 1312 06/10/13 1604 06/10/13 2020 06/11/13 0636  GLUCAP 85 102* 173* 96    No results found for this or any previous visit (from the past 240 hour(s)).   Studies: Dg Chest 2 View  06/10/2013   CLINICAL DATA:  Stroke  EXAM: CHEST  2 VIEW  COMPARISON:  05/10/2013  FINDINGS: The heart size and mediastinal contours are within normal limits. Both lungs are clear. The visualized skeletal structures are unremarkable.  IMPRESSION: No active cardiopulmonary disease.   Electronically Signed   By: Lahoma Crocker   On: 06/10/2013 18:19   Ct Head Wo Contrast  06/10/2013   CLINICAL DATA:  62 year old female code stroke. Right side weakness and slurred speech. Last seen normal 2100 hrs.  EXAM: CT HEAD WITHOUT CONTRAST  TECHNIQUE: Contiguous axial images were obtained from the base of the skull through the vertex without intravenous contrast.  COMPARISON:  05/10/2013.  FINDINGS:  Visualized paranasal sinuses and mastoids are clear. No acute osseous abnormality identified. Stable orbits and scalp soft tissues.  Chronic full amic and other deep gray matter nuclei lacunar infarcts re- identified. Patchy cerebral white matter hypodensity greater in the left hemisphere is stable. There are new small areas of cortical hypodensity in the left MCA territory without mass effect or hemorrhage (series 2, images 22, 20, 16). No other cortically based infarct identified. No suspicious intracranial vascular hyperdensity. No midline shift, mass effect, or evidence of intracranial mass lesion. No ventriculomegaly.  IMPRESSION: 1. Scattered small acute to subacute cortically based infarcts in the left MCA territory. No mass effect or hemorrhage.  2. Underlying chronic small vessel ischemia.  Study discussed by telephone with Dr. Armida Sans on 06/10/2013 at 13:04 .   Electronically Signed   By: Lars Pinks M.D.   On: 06/10/2013 13:06   Mr Brain Wo Contrast  06/10/2013   *RADIOLOGY REPORT*  Clinical Data:  Stroke.  Right-sided weakness.  Slurred speech. Abnormal CT.  MRI HEAD WITHOUT CONTRAST MRA HEAD WITHOUT CONTRAST  Technique:  Multiplanar, multiecho pulse sequences of the brain and surrounding structures were obtained without intravenous contrast. Angiographic images of the head were obtained using MRA technique without contrast.  Comparison:  CT head without contrast 06/10/2013.  MRI HEAD  Findings:  The diffusion weighted images demonstrate an acute punctate non hemorrhagic infarcts within the left thalamus, the posterior limb of the left internal capsule, and the left insular cortex.  There are additional focal non hemorrhagic infarcts involving the genu of the right internal capsule and the posterior limb of the right internal capsule.  Atrophy and diffuse white matter disease is present in addition. More remote lacunar infarcts are present within the thalami and basal ganglia bilaterally. Moderate  generalized atrophy is advanced for age.  The ventricles are proportionate to the degree of atrophy.  White matter changes extend into the brainstem.  Flow is present in the major intracranial arteries.  The globes and orbits are intact.  The paranasal sinuses and mastoid air cells are clear.  IMPRESSION:  1.  Acute non hemorrhagic punctate infarcts of the left thalamus, internal capsule, and insular cortex. 2.  Acute non hemorrhagic punctate infarcts of the right internal capsule. 3.  Atrophy and extensive white matter disease is advanced for age. 4.  Remote lacunar infarcts of the thalami and basal ganglia bilaterally.  MRA HEAD  Findings: Moderate tortuosity is present within the cervical internal carotid arteries bilaterally.  Atherosclerotic irregularities present in the cavernous carotid arteries bilaterally without significant stenoses.  The A1 and M1 segments are within normal limits.  The anterior communicating artery is patent.  There is moderate attenuation of the more distal MCA branch vessels bilaterally, left greater than right.  The left vertebral artery is  the dominant vessel.  Atherosclerotic irregularity is present in the right vertebral artery without a significant stenosis.  The basilar artery is intact.  Both posterior cerebral arteries originate from basilar tip.  A right posterior communicating artery contributes.  Moderate stenosis is evident within the right P1 segment.  There is moderate attenuation of distal PCA branch vessels.  IMPRESSION:  1.  Moderate small vessel disease. 2.  Moderate stenosis of the right P1 segment. 3.  Atherosclerotic changes within the cavernous carotid arteries bilaterally as well as the right vertebral artery.   Original Report Authenticated By: San Morelle, M.D.   Mr Jodene Nam Head/brain Wo Cm  06/11/2013   MR angiogram report combined with MR brain report performed at the same time. Dictated by Dr. Jobe Igo.   Original Report Authenticated By: Genia Del,  M.D.    Scheduled Meds: . aspirin  300 mg Rectal Daily   Or  . aspirin  325 mg Oral Daily  . heparin  5,000 Units Subcutaneous Q8H  . insulin aspart  0-9 Units Subcutaneous TID WC  . sodium chloride  3 mL Intravenous Q12H   Continuous Infusions: . sodium chloride 75 mL/hr at 06/11/13 0320    Principal Problem:   CVA (cerebral infarction) Active Problems:   History of alcohol use   HTN (hypertension)   Diabetes   Memory loss   Kidney disease, chronic, stage III (GFR 30-59 ml/min)    Time spent: > 35 minutes     Kinnie Feil  [facility] Pager [phone]. If 7PM-7AM, please contact night-coverage at [email or web address], password [facility] 06/11/2013, 9:14 AM  LOS: 1 day

## 2013-06-12 LAB — GLUCOSE, CAPILLARY
Glucose-Capillary: 120 mg/dL — ABNORMAL HIGH (ref 70–99)
Glucose-Capillary: 209 mg/dL — ABNORMAL HIGH (ref 70–99)

## 2013-06-12 LAB — URINE CULTURE

## 2013-06-12 MED ORDER — ASPIRIN 325 MG PO TABS: 325.0000 mg | ORAL_TABLET | Freq: Every day | ORAL | Status: DC

## 2013-06-12 MED ORDER — AMLODIPINE BESYLATE 5 MG PO TABS: 5.0000 mg | ORAL_TABLET | Freq: Every day | ORAL | Status: DC

## 2013-06-12 MED ORDER — SIMVASTATIN 20 MG PO TABS: 20.0000 mg | ORAL_TABLET | Freq: Every day | ORAL | Status: DC

## 2013-06-12 MED ORDER — METRONIDAZOLE 500 MG PO TABS: 500.0000 mg | ORAL_TABLET | Freq: Two times a day (BID) | ORAL | Status: DC

## 2013-06-12 MED ORDER — LEVOFLOXACIN 500 MG PO TABS: 500.0000 mg | ORAL_TABLET | Freq: Every day | ORAL | Status: DC

## 2013-06-12 NOTE — Progress Notes (Signed)
Stroke Team Progress Note  HISTORY Kathryn Cook is an 62 y.o. female who LKW 2100 on 06/09/2013. Family noticed upon her awakening at 1100 on 06/10/2013 that she had right sided weakness, slurred speech and facial droop. En route CBG 107. BP 210/122. NIHSS=8. Patient was not a TPA candidate secondary to delay in arrival. She was admitted for further evaluation and treatment.  SUBJECTIVE Her sister Kathryn Cook is at the bedside. She is asking about plans for discharge.  OBJECTIVE Most recent Vital Signs: Filed Vitals:   06/12/13 0700 06/12/13 0800 06/12/13 1028 06/12/13 1145  BP: 173/90 170/84 169/95 171/100  Pulse:  103 96 91  Temp:  98.8 F (37.1 C) 99 F (37.2 C) 98.4 F (36.9 C)  TempSrc:  Oral Oral Oral  Resp:  24 22 20   Height:      Weight:      SpO2:  98% 98% 99%   CBG (last 3)   Recent Labs  06/11/13 2059 06/12/13 0650 06/12/13 1150  GLUCAP 123* 120* 112*    IV Fluid Intake:      MEDICATIONS  . amLODipine  5 mg Oral Daily  . aspirin  300 mg Rectal Daily   Or  . aspirin  325 mg Oral Daily  . heparin  5,000 Units Subcutaneous Q8H  . insulin aspart  0-9 Units Subcutaneous TID WC  . levofloxacin (LEVAQUIN) IV  500 mg Intravenous Q24H  . metroNIDAZOLE  500 mg Oral Q12H  . simvastatin  20 mg Oral q1800  . sodium chloride  3 mL Intravenous Q12H   PRN:  albuterol, guaiFENesin-dextromethorphan, hydrALAZINE, HYDROcodone-acetaminophen, ondansetron (ZOFRAN) IV, ondansetron, polyethylene glycol  Diet:  Carb Control thin liquids Activity:  Up with assistance DVT Prophylaxis:  Heparin 5000 units sq tid   CLINICALLY SIGNIFICANT STUDIES Basic Metabolic Panel:   Recent Labs Lab 06/10/13 1252 06/10/13 1302 06/11/13 0550  NA 140 139 141  K 4.3 4.2 3.7  CL 100 102 104  CO2 29  --  28  GLUCOSE 94 96 98  BUN 24* 27* 20  CREATININE 0.95 1.30* 0.87  CALCIUM 10.0  --  9.3   Liver Function Tests:   Recent Labs Lab 06/10/13 1252  AST 15  ALT 9  ALKPHOS 67   BILITOT 0.2*  PROT 7.1  ALBUMIN 3.2*   CBC:   Recent Labs Lab 06/10/13 1252 06/10/13 1302 06/11/13 0550  WBC 7.1  --  6.8  NEUTROABS 3.6  --   --   HGB 13.4 13.9 12.3  HCT 40.9 41.0 38.1  MCV 87.4  --  87.6  PLT 199  --  231   Coagulation:   Recent Labs Lab 06/10/13 1252  LABPROT 12.1  INR 0.91   Cardiac Enzymes:   Recent Labs Lab 06/10/13 1252  TROPONINI <0.30   Urinalysis:   Recent Labs Lab 06/11/13 0303  COLORURINE YELLOW  LABSPEC 1.011  PHURINE 7.0  GLUCOSEU NEGATIVE  HGBUR MODERATE*  BILIRUBINUR NEGATIVE  KETONESUR NEGATIVE  PROTEINUR 100*  UROBILINOGEN 0.2  NITRITE NEGATIVE  LEUKOCYTESUR LARGE*   Lipid Panel    Component Value Date/Time   CHOL 230* 06/11/2013 0550   TRIG 58 06/11/2013 0550   HDL 94 06/11/2013 0550   CHOLHDL 2.4 06/11/2013 0550   VLDL 12 06/11/2013 0550   LDLCALC 124* 06/11/2013 0550   HgbA1C  Lab Results  Component Value Date   HGBA1C 7.0* 06/11/2013    Urine Drug Screen:     Component Value Date/Time  LABOPIA NONE DETECTED 06/11/2013 0303   COCAINSCRNUR NONE DETECTED 06/11/2013 0303   LABBENZ NONE DETECTED 06/11/2013 0303   AMPHETMU NONE DETECTED 06/11/2013 0303   THCU NONE DETECTED 06/11/2013 0303   LABBARB NONE DETECTED 06/11/2013 0303    Alcohol Level:   Recent Labs Lab 06/10/13 1252  ETH <11    CT of the brain  06/10/2013   1. Scattered small acute to subacute cortically based infarcts in the left MCA territory. No mass effect or hemorrhage.  2. Underlying chronic small vessel ischemia.    MRI of the brain  06/10/2013   1.  Acute non hemorrhagic punctate infarcts of the left thalamus, internal capsule, and insular cortex. 2.  Acute non hemorrhagic punctate infarcts of the right internal capsule. 3.  Atrophy and extensive white matter disease is advanced for age. 4.  Remote lacunar infarcts of the thalami and basal ganglia bilaterally.    MRA of the brain  06/10/2013    1.  Moderate small vessel disease. 2.  Moderate  stenosis of the right P1 segment. 3.  Atherosclerotic changes within the cavernous carotid arteries bilaterally as well as the right vertebral artery.    2D Echocardiogram  EF 55-60% with no source of embolus.   Carotid Doppler  No evidence of hemodynamically significant internal carotid artery stenosis. Vertebral artery flow is antegrade.   CXR  06/10/2013    No active cardiopulmonary disease.   EKG  Sinus rhythm.   Therapy Recommendations SNF  Physical Exam   Pleasant african american lady sitting up in bedside chair.Awake alert. Afebrile. Head is nontraumatic. Neck is supple without bruit. Hearing is normal. Cardiac exam no murmur or gallop. Lungs are clear to auscultation. Distal pulses are well felt.  Neurological Exam awake alert oriented x 2 diminished attention, registration and recall. Poor short term memory recall 0/3. Decrease animal naming. Follow 1 step commands only.Fundi not visualized. Vision acuity and fields seem normal. Mild right lower face weakness. Tongue midline. No drift mild right grip weakness. No leg weakness. Mild right hip flexor weakness. Sensation, coordination normal. Gait deferred.  ASSESSMENT Kathryn Cook is a 62 y.o. female presenting with right sided weakness. Imaging confirms  left thalamus, internal capsule, and insular cortex infarcts as well as right internal capsule infarcts. Infarcts felt to be thrombotic secondary to small vessel disease given location and uncontrolled risk factors.  On no antithrombotics prior to admission. Now on aspirin 325 mg orally every day for secondary stroke prevention. Patient with resultant right hemiparesis. Work up completed.   Baseline dementia Hyperlipidemia, LDL 124, on no statin PTA, now on zocor 20, goal LDL < 100 (< 70 for diabetics) Diabetes, HgbA1c 7.1, goal < 7.0  Hospital day # 2  TREATMENT/PLAN  Continue aspirin 325 mg orally every day for secondary stroke prevention.  Social work consult for  placement at family request  Medically stable for discharge from stroke standpoint. Await bed. No further stroke workup indicated. Patient has a 10-15% risk of having another stroke over the next year, the highest risk is within 2 weeks of the most recent stroke/TIA (risk of having a stroke following a stroke or TIA is the same). Ongoing risk factor control by Primary Care Physician Stroke Service will sign off. Please call should any needs arise. Follow up with Dr. Leonie Man, [facility], in 2 months.  Burnetta Sabin, MSN, RN, ANVP-BC, ANP-BC, GNP-BC [facility] Pager: [phone] 06/12/2013 1:33 PM  I evaluated and examined patient, reviewed  records, labs and imaging, and agree with note and plan.  Penni Bombard, MD [id number], [id number] PM Certified in Neurology, Neurophysiology and Neuroimaging [facility] - Stroke Team  Please refer to [email or web address] for on-call Stroke MD

## 2013-06-12 NOTE — Evaluation (Signed)
Speech Language Pathology Evaluation Patient Details Name: Kathryn Cook MRN: [id number] DOB: May 04, 1951 Today's Date: 06/12/2013 Time: [id number] SLP Time Calculation (min): 35 min  Problem List:  Patient Active Problem List   Diagnosis Date Noted  . CVA (cerebral infarction) 06/10/2013  . History of alcohol use 05/31/2013  . HTN (hypertension) 05/31/2013  . Diabetes 05/31/2013  . Memory loss 05/31/2013  . Kidney disease, chronic, stage III (GFR 30-59 ml/min) 05/31/2013   Past Medical History:  Past Medical History  Diagnosis Date  . Hypertension   . Bronchitis   . Diabetes mellitus without complication   . Substance abuse     alcohol, but patient states she doesn't drink that much anymore   Past Surgical History: History reviewed. No pertinent past surgical history. HPI:  pt is a 62 yo female who resides with her sister admitted with slurred speech and right sided weakness.  Found to have left thalamic, internal capsule infarct, remote basal ganglia cva bilaterally.  Pt has h/o HTN, cocaine use, early onset cognitive deficit -? multi infarct dementia.  Pt reports her sister works full time M-F.     Assessment / Plan / Recommendation Clinical Impression  Pt presents with severe cognitive linguistic deficits with impairments in orientation, attention, working memory, problem solving and judgement.  Pt was able to identify her home street address and home phone number from written choice of two - therefore retrieval deficit of long term information noted- ? language vs memory.  Pt was able to name 5 objects in pictures.  She followed 2 step commands, broke down at 3 steps - suspect due to attention diffiiculties.  Pt verbal communication is effective for basic needs is effective.  She admits to changes with memory.  Pt did not recall need to ask for blinds to be repaired in her room within 2 minutes of identification.    SLP to see pt on trial basis for cognitive linguistic  treatment given new changes with this neuro event to mitigate cog-ling deficit and decrease caregiver burden.  Skilled intervention included compensation strategy initiation and pt education.       SLP Assessment  Patient needs continued Speech Lanaguage Pathology Services    Follow Up Recommendations  24 hour supervision/assistance    Frequency and Duration min 2x/week  2 weeks   Pertinent Vitals/Pain Afebrile, decreased   SLP Goals  SLP Goals Potential to Achieve Goals: Fair Potential Considerations: Co-morbidities;Previous level of function;Ability to learn/carryover information;Severity of impairments (unknown prior level of function) SLP Goal #1: Pt willl demonstrate sustained attention to functional task for 60 seconds with moderate assist. SLP Goal #2: Pt will follow 3 step commands with 75% accuracy and moderate assist.  SLP Goal #3: Pt use compensation strategy to be oriented x4 with max assist and 75% accuracy.   SLP Evaluation Prior Functioning   unknown, no family present   Cognition  Overall Cognitive Status: No family/caregiver present to determine baseline cognitive functioning Orientation Level: Oriented to person;Disoriented to place;Disoriented to time;Disoriented to situation (to time with max assist- calendar given to her) Attention: Selective;Sustained Sustained Attention: Impaired Sustained Attention Impairment: Functional basic;Verbal basic Selective Attention: Impaired Selective Attention Impairment: Functional basic;Verbal basic Memory: Impaired Memory Impairment: Storage deficit;Retrieval deficit;Decreased recall of new information;Prospective memory;Decreased long term memory (able to ID own street address and phone from choice of 2) Awareness: Impaired Awareness Impairment: Intellectual impairment;Emergent impairment (I'm getting up on my own.) Problem Solving: Impaired Problem Solving Impairment: Verbal basic;Functional basic Safety/Judgment:  Impaired  Comprehension  Auditory Comprehension Yes/No Questions: Not tested Commands: Impaired Two Step Basic Commands: 75-100% accurate Multistep Basic Commands: 25-49% accurate Conversation: Simple Interfering Components: Attention Visual Recognition/Discrimination Discrimination: Within Function Limits Reading Comprehension Reading Status: Within funtional limits    Expression Expression Primary Mode of Expression: Verbal Verbal Expression Overall Verbal Expression: Appears within functional limits for tasks assessed Initiation: No impairment Level of Generative/Spontaneous Verbalization: Sentence Repetition: No impairment Naming: No impairment Pragmatics: No impairment Written Expression Dominant Hand: Right Written Expression: Not tested   Oral / Motor Oral Motor/Sensory Function Overall Oral Motor/Sensory Function: Appears within functional limits for tasks assessed Motor Speech Overall Motor Speech: Appears within functional limits for tasks assessed   GO     Claudie Fisherman, [facility] SLP [phone]

## 2013-06-12 NOTE — Progress Notes (Signed)
UR COMPLETED  

## 2013-06-12 NOTE — Discharge Summary (Addendum)
Physician Discharge Summary  Kathryn Cook [facility] [id number] DOB: [birth year] DOA: 06/10/2013  PCP: Sandi Mealy, MD  Admit date: 06/10/2013 Discharge date: 06/14/2013  Time spent: > 35  minutes  Recommendations for Outpatient Follow-up:   -f/u with PCP in 1-2 weeks post discharge   Discharge Diagnoses:  Principal Problem:   CVA (cerebral infarction) Active Problems:   History of alcohol use   HTN (hypertension)   Diabetes   Memory loss   Kidney disease, chronic, stage III (GFR 30-59 ml/min)   Discharge Condition: stable   Diet recommendation: heart healthy   Filed Weights   06/10/13 1500 06/11/13 0500  Weight: 92.2 kg (203 lb 4.2 oz) 96.5 kg (212 lb 11.9 oz)    History of present illness:  62 y.o. female, with recently diagnosed hypertension and diabetes mellitus, who saw a physician for the first time few weeks ago, prolonged history of smoking, alcohol and recreational drug abuse including crack cocaine, quit all substance abuse and smoking about 3 months ago, also carries a diagnosis of early dementia (question if this is vascular dementia secondary to multiple occult strokes) , patient with above history who lives with her daughter was brought in by family members after she was noticed to have extreme right-sided weakness, facial droop    Hospital Course: patient is admitted with acute CVA  1. Acute CVA; right sided weakness, slurred speech and facial droop  -MRI: punctate infarcts of the left thalamus, internal capsule, and insular cortex. right internal capsule. Atrophy and extensive white matter disease; remote infarcts; echo: LVEF 55%; no embolism; doppler < 39 % prelim   -clinically better; patient seen evaluated by neurology who recommended ASA, statin,   2. HTN initial permissive HTN; not on meds at home;  -started amlodipine, titrate per response    3. UTI; start atx-levoflox; c/s: Multiple bacterial morphotypes present, none predominant. -pos for  trichomonas; start metronidazole, need std screen/pend; RPR neg;   4. Dementia; likely baseline confusion;   5. DM recent Dx; HA1C-7.1  -resume metformin;    D/c to SNF when bed available   Procedures:  MRI as above  Consultations:  Neurology   Discharge Exam: Filed Vitals:   06/12/13 1028  BP: 169/95  Pulse: 96  Temp: 99 F (37.2 C)  Resp: 22    General: alert, confused  Cardiovascular: S1, S2 rrr  Respiratory: CTA bl   Discharge Instructions  Discharge Orders   Future Appointments Provider Department Dept Phone   06/14/2013 9:40 AM [place], MD [facility] [phone]   Future Orders Complete By Expires   Diet - low sodium heart healthy  As directed    Discharge instructions  As directed    Comments:     Follow up with primary care doctor in 1-2 weeks post discharge   Increase activity slowly  As directed        Medication List         amLODipine 10 MG tablet  Commonly known as:  NORVASC  Take 1 tablet (10 mg total) by mouth daily.     aspirin 325 MG tablet  Take 1 tablet (325 mg total) by mouth daily.     hydrochlorothiazide 25 MG tablet  Commonly known as:  HYDRODIURIL  Take 25 mg by mouth daily.     levofloxacin 500 MG tablet  Commonly known as:  LEVAQUIN  Take 1 tablet (500 mg total) by mouth daily.     metFORMIN 500 MG tablet  Commonly known as:  GLUCOPHAGE  Take 500 mg by mouth 2 (two) times daily with a meal.     metroNIDAZOLE 500 MG tablet  Commonly known as:  FLAGYL  Take 1 tablet (500 mg total) by mouth every 12 (twelve) hours.     ramipril 1.25 MG capsule  Commonly known as:  ALTACE  Take 1 capsule (1.25 mg total) by mouth daily.     simvastatin 20 MG tablet  Commonly known as:  ZOCOR  Take 1 tablet (20 mg total) by mouth daily at 6 PM.       No Known Allergies     Follow-up Information   Follow up with Sandi Mealy, MD In 1 week.   Specialty:  Family Medicine   Contact  information:   [address] [place] [address] [phone]        The results of significant diagnostics from this hospitalization (including imaging, microbiology, ancillary and laboratory) are listed below for reference.    Significant Diagnostic Studies: Dg Chest 2 View  06/10/2013   CLINICAL DATA:  Stroke  EXAM: CHEST  2 VIEW  COMPARISON:  05/10/2013  FINDINGS: The heart size and mediastinal contours are within normal limits. Both lungs are clear. The visualized skeletal structures are unremarkable.  IMPRESSION: No active cardiopulmonary disease.   Electronically Signed   By: Lahoma Crocker   On: 06/10/2013 18:19   Ct Head Wo Contrast  06/10/2013   CLINICAL DATA:  62 year old female code stroke. Right side weakness and slurred speech. Last seen normal 2100 hrs.  EXAM: CT HEAD WITHOUT CONTRAST  TECHNIQUE: Contiguous axial images were obtained from the base of the skull through the vertex without intravenous contrast.  COMPARISON:  05/10/2013.  FINDINGS: Visualized paranasal sinuses and mastoids are clear. No acute osseous abnormality identified. Stable orbits and scalp soft tissues.  Chronic full amic and other deep gray matter nuclei lacunar infarcts re- identified. Patchy cerebral white matter hypodensity greater in the left hemisphere is stable. There are new small areas of cortical hypodensity in the left MCA territory without mass effect or hemorrhage (series 2, images 22, 20, 16). No other cortically based infarct identified. No suspicious intracranial vascular hyperdensity. No midline shift, mass effect, or evidence of intracranial mass lesion. No ventriculomegaly.  IMPRESSION: 1. Scattered small acute to subacute cortically based infarcts in the left MCA territory. No mass effect or hemorrhage.  2. Underlying chronic small vessel ischemia.  Study discussed by telephone with Dr. Armida Sans on 06/10/2013 at 13:04 .   Electronically Signed   By: Lars Pinks M.D.   On: 06/10/2013 13:06    Mr Brain Wo Contrast  06/10/2013   *RADIOLOGY REPORT*  Clinical Data:  Stroke.  Right-sided weakness.  Slurred speech. Abnormal CT.  MRI HEAD WITHOUT CONTRAST MRA HEAD WITHOUT CONTRAST  Technique:  Multiplanar, multiecho pulse sequences of the brain and surrounding structures were obtained without intravenous contrast. Angiographic images of the head were obtained using MRA technique without contrast.  Comparison:  CT head without contrast 06/10/2013.  MRI HEAD  Findings:  The diffusion weighted images demonstrate an acute punctate non hemorrhagic infarcts within the left thalamus, the posterior limb of the left internal capsule, and the left insular cortex.  There are additional focal non hemorrhagic infarcts involving the genu of the right internal capsule and the posterior limb of the right internal capsule.  Atrophy and diffuse white matter disease is present in addition. More remote lacunar infarcts are present within  the thalami and basal ganglia bilaterally. Moderate generalized atrophy is advanced for age.  The ventricles are proportionate to the degree of atrophy.  White matter changes extend into the brainstem.  Flow is present in the major intracranial arteries.  The globes and orbits are intact.  The paranasal sinuses and mastoid air cells are clear.  IMPRESSION:  1.  Acute non hemorrhagic punctate infarcts of the left thalamus, internal capsule, and insular cortex. 2.  Acute non hemorrhagic punctate infarcts of the right internal capsule. 3.  Atrophy and extensive white matter disease is advanced for age. 4.  Remote lacunar infarcts of the thalami and basal ganglia bilaterally.  MRA HEAD  Findings: Moderate tortuosity is present within the cervical internal carotid arteries bilaterally.  Atherosclerotic irregularities present in the cavernous carotid arteries bilaterally without significant stenoses.  The A1 and M1 segments are within normal limits.  The anterior communicating artery is patent.   There is moderate attenuation of the more distal MCA branch vessels bilaterally, left greater than right.  The left vertebral artery is the dominant vessel.  Atherosclerotic irregularity is present in the right vertebral artery without a significant stenosis.  The basilar artery is intact.  Both posterior cerebral arteries originate from basilar tip.  A right posterior communicating artery contributes.  Moderate stenosis is evident within the right P1 segment.  There is moderate attenuation of distal PCA branch vessels.  IMPRESSION:  1.  Moderate small vessel disease. 2.  Moderate stenosis of the right P1 segment. 3.  Atherosclerotic changes within the cavernous carotid arteries bilaterally as well as the right vertebral artery.   Original Report Authenticated By: San Morelle, M.D.   Mr Jodene Nam Head/brain Wo Cm  06/11/2013   MR angiogram report combined with MR brain report performed at the same time. Dictated by Dr. Jobe Igo.   Original Report Authenticated By: Genia Del, M.D.    Microbiology: Recent Results (from the past 240 hour(s))  URINE CULTURE     Status: None   Collection Time    06/11/13  3:03 AM      Result Value Range Status   Specimen Description URINE, RANDOM   Final   Special Requests ADDED [id number]   Final   Culture  Setup Time     Final   Value: 06/11/2013 09:49     Performed at [place]     Final   Value: 40,000 COLONIES/ML     Performed at [facility]   Culture     Final   Value: Multiple bacterial morphotypes present, none predominant. Suggest appropriate recollection if clinically indicated.     Performed at [facility]   Report Status 06/12/2013 FINAL   Final     Labs: Basic Metabolic Panel:  Recent Labs Lab 06/10/13 1252 06/10/13 1302 06/11/13 0550  NA 140 139 141  K 4.3 4.2 3.7  CL 100 102 104  CO2 29  --  28  GLUCOSE 94 96 98  BUN 24* 27* 20  CREATININE 0.95 1.30* 0.87  CALCIUM 10.0  --  9.3   Liver  Function Tests:  Recent Labs Lab 06/10/13 1252  AST 15  ALT 9  ALKPHOS 67  BILITOT 0.2*  PROT 7.1  ALBUMIN 3.2*   No results found for this basename: LIPASE, AMYLASE,  in the last 168 hours No results found for this basename: AMMONIA,  in the last 168 hours CBC:  Recent Labs Lab 06/10/13 1252 06/10/13 1302 06/11/13  0550  WBC 7.1  --  6.8  NEUTROABS 3.6  --   --   HGB 13.4 13.9 12.3  HCT 40.9 41.0 38.1  MCV 87.4  --  87.6  PLT 199  --  231   Cardiac Enzymes:  Recent Labs Lab 06/10/13 1252  TROPONINI <0.30   BNP: BNP (last 3 results) No results found for this basename: PROBNP,  in the last 8760 hours CBG:  Recent Labs Lab 06/11/13 0636 06/11/13 1131 06/11/13 1656 06/11/13 2059 06/12/13 0650  GLUCAP 96 117* 88 123* 120*       Signed:  BURIEV, ULUGBEK N  [facility] 06/12/2013, 11:05 AM

## 2013-06-13 DIAGNOSIS — I119 Hypertensive heart disease without heart failure: Secondary | ICD-10-CM

## 2013-06-13 DIAGNOSIS — N390 Urinary tract infection, site not specified: Secondary | ICD-10-CM

## 2013-06-13 DIAGNOSIS — I5030 Unspecified diastolic (congestive) heart failure: Secondary | ICD-10-CM

## 2013-06-13 LAB — GLUCOSE, CAPILLARY: Glucose-Capillary: 124 mg/dL — ABNORMAL HIGH (ref 70–99)

## 2013-06-13 MED ORDER — PNEUMOCOCCAL VAC POLYVALENT 25 MCG/0.5ML IJ INJ
0.5000 mL | INJECTION | INTRAMUSCULAR | Status: DC
  Filled 2013-06-13: qty 0.5

## 2013-06-13 MED ORDER — LEVOFLOXACIN 500 MG PO TABS
500.0000 mg | ORAL_TABLET | ORAL | Status: DC
  Administered 2013-06-13 – 2013-06-14 (×2): 500 mg via ORAL
  Filled 2013-06-13 (×2): qty 1

## 2013-06-13 MED ORDER — HYDRALAZINE HCL 20 MG/ML IJ SOLN
10.0000 mg | Freq: Four times a day (QID) | INTRAMUSCULAR | Status: DC | PRN
  Administered 2013-06-13: 10 mg via INTRAMUSCULAR

## 2013-06-13 MED ORDER — INFLUENZA VAC SPLIT QUAD 0.5 ML IM SUSP
0.5000 mL | INTRAMUSCULAR | Status: DC
  Filled 2013-06-13: qty 0.5

## 2013-06-13 MED ORDER — AMLODIPINE BESYLATE 10 MG PO TABS: 10.0000 mg | ORAL_TABLET | Freq: Every day | ORAL | Status: DC

## 2013-06-13 MED ORDER — RAMIPRIL 1.25 MG PO CAPS: 1.2500 mg | ORAL_CAPSULE | Freq: Every day | ORAL | Status: DC

## 2013-06-13 MED ORDER — AMLODIPINE BESYLATE 10 MG PO TABS
10.0000 mg | ORAL_TABLET | Freq: Every day | ORAL | Status: DC
  Administered 2013-06-14: 10 mg via ORAL
  Filled 2013-06-13: qty 1

## 2013-06-13 MED ORDER — RAMIPRIL 1.25 MG PO CAPS
1.2500 mg | ORAL_CAPSULE | Freq: Every day | ORAL | Status: DC
  Administered 2013-06-13 – 2013-06-14 (×2): 1.25 mg via ORAL
  Filled 2013-06-13 (×2): qty 1

## 2013-06-13 NOTE — Progress Notes (Signed)
[facility] PROGRESS NOTE  Kathryn Cook [id number] DOB: 03-08-1951 DOA: 06/10/2013 PCP: Sandi Mealy, MD  Assessment/Plan: 1. Acute CVA; right sided weakness, slurred speech and facial droop  -MRI: punctate infarcts of the left thalamus, internal capsule, and insular cortex. right internal capsule. Atrophy and extensive white matter disease; remote infarcts; echo: LVEF 55%; no embolism; doppler < 39 % prelim  -clinically better; patient seen evaluated by neurology who recommended ASA, statin,   2. HTN initial permissive HTN; not on meds at home; suboptimally controlled -started amlodipine, increase to 10 mg daily, add ramipril 1.25 mg po daily.   3. UTI; start atx-levoflox; c/s: Multiple bacterial morphotypes present, none predominant.  -pos for trichomonas; start metronidazole,  RPR neg;   4. Dementia; likely baseline confusion; overall improved.    5. DM recent Dx; HA1C-7.1  -resume metformin;   6. Acute Renal Failure - improved with IVFs.   D/c to SNF when bed available, currently awaiting bed.   HPI/Subjective: Pt without complaints. She pulled out all of her IV lines.    Objective: Filed Vitals:   06/13/13 1358  BP: 177/95  Pulse: 83  Temp: 98.2 F (36.8 C)  Resp: 18    Intake/Output Summary (Last 24 hours) at 06/13/13 1421 Last data filed at 06/13/13 1243  Gross per 24 hour  Intake    600 ml  Output      0 ml  Net    600 ml   Filed Weights   06/10/13 1500 06/11/13 0500  Weight: 92.2 kg (203 lb 4.2 oz) 96.5 kg (212 lb 11.9 oz)    Exam: General: alert, confused  Cardiovascular: normal S1, S2 no murmurs Respiratory: CTA bl  Ext: no CCE Neuro: moving all extremities, speech clear  Data Reviewed: Basic Metabolic Panel:  Recent Labs Lab 06/10/13 1252 06/10/13 1302 06/11/13 0550  NA 140 139 141  K 4.3 4.2 3.7  CL 100 102 104  CO2 29  --  28  GLUCOSE 94 96 98  BUN 24* 27* 20  CREATININE 0.95 1.30* 0.87  CALCIUM 10.0  --  9.3    Liver Function Tests:  Recent Labs Lab 06/10/13 1252  AST 15  ALT 9  ALKPHOS 67  BILITOT 0.2*  PROT 7.1  ALBUMIN 3.2*   No results found for this basename: LIPASE, AMYLASE,  in the last 168 hours No results found for this basename: AMMONIA,  in the last 168 hours CBC:  Recent Labs Lab 06/10/13 1252 06/10/13 1302 06/11/13 0550  WBC 7.1  --  6.8  NEUTROABS 3.6  --   --   HGB 13.4 13.9 12.3  HCT 40.9 41.0 38.1  MCV 87.4  --  87.6  PLT 199  --  231   Cardiac Enzymes:  Recent Labs Lab 06/10/13 1252  TROPONINI <0.30   BNP (last 3 results) No results found for this basename: PROBNP,  in the last 8760 hours CBG:  Recent Labs Lab 06/12/13 1150 06/12/13 1636 06/12/13 2129 06/13/13 0639 06/13/13 1145  GLUCAP 112* 209* 94 127* 124*    Recent Results (from the past 240 hour(s))  URINE CULTURE     Status: None   Collection Time    06/11/13  3:03 AM      Result Value Range Status   Specimen Description URINE, RANDOM   Final   Special Requests ADDED [id number]   Final   Culture  Setup Time     Final   Value: 06/11/2013 09:49  Performed at [place]     Final   Value: 40,000 COLONIES/ML     Performed at [facility]   Culture     Final   Value: Multiple bacterial morphotypes present, none predominant. Suggest appropriate recollection if clinically indicated.     Performed at [facility]   Report Status 06/12/2013 FINAL   Final     Studies: No results found.  Scheduled Meds: . [START ON 06/14/2013] amLODipine  10 mg Oral Daily  . aspirin  300 mg Rectal Daily   Or  . aspirin  325 mg Oral Daily  . heparin  5,000 Units Subcutaneous Q8H  . [START ON 06/14/2013] influenza vac split quadrivalent PF  0.5 mL Intramuscular Tomorrow-1000  . insulin aspart  0-9 Units Subcutaneous TID WC  . levofloxacin  500 mg Oral Q24H  . metroNIDAZOLE  500 mg Oral Q12H  . [START ON 06/14/2013] pneumococcal 23 valent vaccine  0.5 mL  Intramuscular Tomorrow-1000  . ramipril  1.25 mg Oral Daily  . simvastatin  20 mg Oral q1800  . sodium chloride  3 mL Intravenous Q12H   Continuous Infusions:   Principal Problem:   CVA (cerebral infarction) Active Problems:   History of alcohol use   HTN (hypertension)   Diabetes   Memory loss   Kidney disease, chronic, stage III (GFR 30-59 ml/min)   Diastolic congestive heart failure, NYHA class 1   LVH (left ventricular hypertrophy) due to hypertensive disease   [facility] Pager [phone]. If 7PM-7AM, please contact night-coverage at [email or web address], password [facility] 06/13/2013, 2:21 PM  LOS: 3 days

## 2013-06-13 NOTE — Progress Notes (Signed)
Physical Therapy Treatment Patient Details Name: Kathryn Cook MRN: [id number] DOB: [birth year] Today's Date: 06/13/2013 Time: [id number] PT Time Calculation (min): 14 min  PT Assessment / Plan / Recommendation  History of Present Illness 62 y.o. female, with recently diagnosed hypertension and diabetes mellitus, who saw a physician for the first time few weeks ago, prolonged history of smoking, alcohol and recreational drug abuse including crack cocaine, quit all substance abuse and smoking about 3 months ago, also carries a diagnosis of early dementia (question if this is vascular dementia secondary to multiple occult strokes) , patient with above history who lives with her daughter was brought in by family members after she was noticed to have extreme right-sided weakness this morning, according to the daughter patient was doing great last night and was seen in her usual state of health around 8:30 PM yesterday, this morning when family members went to wake up the patient around 8:30 in the morning she was found to have right-sided weakness and then brought to the ER. In the ER workup was suggestive of left MCA territory CVA which was ischemic in nature, she was found to be outside TPA window, stroke team was consulted who requested hospitalist admission.   PT Comments   Pt ambulated short distance in hallway, however PT limited mobility due to hypertension.  RN was okay with PT visit and reports MD still adjusting BP meds.   Follow Up Recommendations  Supervision/Assistance - 24 hour;SNF     Does the patient have the potential to tolerate intense rehabilitation     Barriers to Discharge        Equipment Recommendations  Rolling walker with 5" wheels (TBA, pt declining using AD, unknown DME at home)    Recommendations for Other Services    Frequency Min 3X/week   Progress towards PT Goals Progress towards PT goals: Progressing toward goals  Plan Current plan remains appropriate     Precautions / Restrictions Precautions Precautions: Fall Precaution Comments: montior BP   Pertinent Vitals/Pain supine: BP 165/89 mmHg HR 89 bpm Ambulating: BP 185/110 mmHg HR 111 Return to supine after activity: BP 186/95 mmHg HR 81 Pt denies pain.   Mobility  Bed Mobility Bed Mobility: Supine to Sit;Sit to Supine Supine to Sit: 5: Supervision;HOB flat Sit to Supine: 5: Supervision;HOB flat Transfers Transfers: Sit to Stand;Stand to Sit Sit to Stand: 4: Min guard;With upper extremity assist;From bed Stand to Sit: 4: Min guard;With upper extremity assist;To bed Details for Transfer Assistance: verbal cues for safe technique including hand placement, observed pt self steady with LEs against bed Ambulation/Gait Ambulation/Gait Assistance: 4: Min guard Ambulation Distance (Feet): 40 Feet (total) Ambulation/Gait Assistance Details: pt declined assistive device however used walls and door for more support, BP checked after 20 feet: 185/110 mmHg and 111 bpm so returned to room  Gait Pattern: Step-through pattern;Wide base of support;Decreased stride length Gait velocity: decreased    Exercises     PT Diagnosis:    PT Problem List:   PT Treatment Interventions:     PT Goals (current goals can now be found in the care plan section)    Visit Information  Last PT Received On: 06/13/13 Assistance Needed: +1 History of Present Illness: 62 y.o. female, with recently diagnosed hypertension and diabetes mellitus, who saw a physician for the first time few weeks ago, prolonged history of smoking, alcohol and recreational drug abuse including crack cocaine, quit all substance abuse and smoking about 3 months ago, also carries a  diagnosis of early dementia (question if this is vascular dementia secondary to multiple occult strokes) , patient with above history who lives with her daughter was brought in by family members after she was noticed to have extreme right-sided weakness this morning,  according to the daughter patient was doing great last night and was seen in her usual state of health around 8:30 PM yesterday, this morning when family members went to wake up the patient around 8:30 in the morning she was found to have right-sided weakness and then brought to the ER. In the ER workup was suggestive of left MCA territory CVA which was ischemic in nature, she was found to be outside TPA window, stroke team was consulted who requested hospitalist admission.    Subjective Data      Cognition  Cognition Arousal/Alertness: Awake/alert Behavior During Therapy: WFL for tasks assessed/performed Overall Cognitive Status: No family/caregiver present to determine baseline cognitive functioning    Balance     End of Session PT - End of Session Equipment Utilized During Treatment: Gait belt Activity Tolerance: Other (comment) (limited by hypertension) Patient left: in bed;with call bell/phone within reach;with bed alarm set   GP     Kathryn Cook,[name] 06/13/2013, 3:59 PM Carmelia Bake, PT, DPT 06/13/2013 Pager: [phone]

## 2013-06-13 NOTE — Progress Notes (Signed)
MD aware of patient's condition, elevated b/p, and no IV access r/t patient pulling out all IV access at this time. Will continue to monitor.  Ave Filter, RN 06/13/13 0930

## 2013-06-13 NOTE — Progress Notes (Signed)
In shift report was told that pt would not keep her tele on, and also has been pulling off her IVs anytime one was inserted, reporting rn stated that he called the doctor and the doctor said it was ok to leave the iv out and tele off, but there was no documentation of that. I got in touch with PA on call  K.Schorr to verify that, and she said if the pt has refused, then we should document and she will also make a note of it to the doctor in the morning. Will continue to monitor pt.---Chi Garlow, rn

## 2013-06-14 ENCOUNTER — Ambulatory Visit: Payer: Medicaid Other | Admitting: Family Medicine

## 2013-06-14 LAB — GLUCOSE, CAPILLARY
Glucose-Capillary: 109 mg/dL — ABNORMAL HIGH (ref 70–99)
Glucose-Capillary: 120 mg/dL — ABNORMAL HIGH (ref 70–99)

## 2013-06-14 NOTE — Progress Notes (Addendum)
Assisting Clinical Education officer, museum (CSW) informed that pt is medically ready for dc today. CSW spoke with Shirlean Mylar at [facility] who confirmed she can offer placement for pt today. CSW spoke with pt who remains agreeable of placement. CSW received consent to notify sister Ms.Augustin Coupe. Pt sister requesting for SNF placement in [place]. CSW has done a new SNF search for [facility] unfortunately pt did not receive any bed offers. Sister made aware and agreeable with dc to [facility]. Facility RN to call CSW when it is okay to proceed with dc.  DC packet has been placed in pt shadow chart. FL2 and DNR to be signed, MD made aware.   Hunt Oris, MSW, [place]

## 2013-06-14 NOTE — Progress Notes (Signed)
SLP Cancellation Note  Patient Details Name: Kathryn Cook MRN: [id number] DOB: 06-13-1951   Cancelled treatment:       Reason Eval/Treat Not Completed: Other (comment) (pt politely declined therapy, requested to try later)   Luanna Salk, [facility] SLP [phone]

## 2013-06-14 NOTE — Progress Notes (Addendum)
Clinical Social Work Department CLINICAL SOCIAL WORK PLACEMENT NOTE 06/14/2013  Patient:  Kathryn Cook, Kathryn Cook  Account Number:  [id number] Admit date:  06/10/2013  Clinical Social Worker:  Hunt Oris, Latanya Presser  Date/time:  06/14/2013 01:05 PM  Clinical Social Work is seeking post-discharge placement for this patient at the following level of care:   [place]   (*CSW will update this form in Epic as items are completed)   06/14/2013  Patient/family provided with [facility] Department of Clinical Social Work's list of facilities offering this level of care within the geographic area requested by the patient (or if unable, by the patient's family).  06/14/2013  Patient/family informed of their freedom to choose among providers that offer the needed level of care, that participate in Medicare, Medicaid or managed care program needed by the patient, have an available bed and are willing to accept the patient.  06/14/2013  Patient/family informed of [facility]' ownership interest in [facility], as well as of the fact that they are under no obligation to receive care at this facility.  PASARR submitted to EDS on 06/14/2013 PASARR number received from EDS on 06/14/2013  FL2 transmitted to all facilities in geographic area requested by pt/family on  06/14/2013 FL2 transmitted to all facilities within larger geographic area on 06/14/2013  Patient informed that his/her managed care company has contracts with or will negotiate with  certain facilities, including the following:     Patient/family informed of bed offers received:   Patient chooses bed at [facility] in [place] recommends and patient chooses bed at    Patient to be transferred to  on  06/14/2013 Patient to be transferred to facility by [facility]  The following physician request were entered in Epic:   Additional Comments:

## 2013-06-14 NOTE — Progress Notes (Signed)
Clinical Social Work Department BRIEF PSYCHOSOCIAL ASSESSMENT 06/14/2013  Patient:  TERETHA, ILG     Account Number:  [id number]     Admit date:  06/10/2013  Clinical Social Worker:  Valda Lamb  Date/Time:  06/14/2013 12:59 PM  Referred by:  Physician  Date Referred:  06/14/2013 Referred for  SNF Placement   Other Referral:   Interview type:  Patient Other interview type:   CSW also completed assessment with pt sister Ms. Climax    PSYCHOSOCIAL DATA Living Status:  ALONE Admitted from facility:   Level of care:   Primary support name:  Theodoro Parma [id number] Primary support relationship to patient:  SIBLING Degree of support available:   Pt sister appears actively involved in pt care.    CURRENT CONCERNS  Other Concerns:    SOCIAL WORK ASSESSMENT / PLAN Covering CSW informed that pt had been faxed out for SNF placement as pt is agreeable to dc to a facility.    CSW confirmed wishes with both patient and sister. Pt lives alone and needs 24hr care before returning home.   Assessment/plan status:  Psychosocial Support/Ongoing Assessment of Needs Other assessment/ plan:   Information/referral to community resources:   SNF list provided to pt.    PATIENT'S/FAMILY'S RESPONSE TO PLAN OF CARE: Pt presents alert and oriented however appreciates communication to be done with sister who is involved in her care.       Hunt Oris, MSW, [place]

## 2013-06-14 NOTE — Progress Notes (Signed)
[facility] PROGRESS NOTE  Kathryn Cook [id number] DOB: April 03, 1951 DOA: 06/10/2013 PCP: Sandi Mealy, MD  Assessment/Plan: 1. Acute CVA; right sided weakness, slurred speech and facial droop  -MRI: punctate infarcts of the left thalamus, internal capsule, and insular cortex. right internal capsule. Atrophy and extensive white matter disease; remote infarcts; echo: LVEF 55%; no embolism; doppler < 39 % prelim  -clinically better; patient seen evaluated by neurology who recommended ASA, statin  2. HTN initial permissive HTN; not on meds at home; suboptimally controlled  -started amlodipine, increase to 10 mg daily, add ramipril 1.25 mg po daily.   3. UTI; start atx-levoflox; c/s: Multiple bacterial morphotypes present, none predominant.  -pos for trichomonas; start metronidazole, RPR neg;   4. Dementia; likely baseline confusion; overall improved.   5. DM recent Dx; HA1C-7.1  -resume metformin;   6. Acute Renal Failure - improved with IVFs.   D/c to SNF when bed available, currently awaiting bed.   HPI/Subjective: Pt without complaints today.   Objective: Filed Vitals:   06/14/13 1123  BP: 169/107  Pulse: 75  Temp: 98.4 F (36.9 C)  Resp: 20    Intake/Output Summary (Last 24 hours) at 06/14/13 1326 Last data filed at 06/14/13 1109  Gross per 24 hour  Intake    603 ml  Output      0 ml  Net    603 ml   Filed Weights   06/10/13 1500 06/11/13 0500 06/14/13 0500  Weight: 92.2 kg (203 lb 4.2 oz) 96.5 kg (212 lb 11.9 oz) 96.2 kg (212 lb 1.3 oz)    Exam:  General: alert, confused  Cardiovascular: normal S1, S2 no murmurs  Respiratory: CTA bl  Ext: no CCE  Neuro: moving all extremities  Data Reviewed: Basic Metabolic Panel:  Recent Labs Lab 06/10/13 1252 06/10/13 1302 06/11/13 0550  NA 140 139 141  K 4.3 4.2 3.7  CL 100 102 104  CO2 29  --  28  GLUCOSE 94 96 98  BUN 24* 27* 20  CREATININE 0.95 1.30* 0.87  CALCIUM 10.0  --  9.3    Liver Function Tests:  Recent Labs Lab 06/10/13 1252  AST 15  ALT 9  ALKPHOS 67  BILITOT 0.2*  PROT 7.1  ALBUMIN 3.2*   No results found for this basename: LIPASE, AMYLASE,  in the last 168 hours No results found for this basename: AMMONIA,  in the last 168 hours CBC:  Recent Labs Lab 06/10/13 1252 06/10/13 1302 06/11/13 0550  WBC 7.1  --  6.8  NEUTROABS 3.6  --   --   HGB 13.4 13.9 12.3  HCT 40.9 41.0 38.1  MCV 87.4  --  87.6  PLT 199  --  231   Cardiac Enzymes:  Recent Labs Lab 06/10/13 1252  TROPONINI <0.30   BNP (last 3 results) No results found for this basename: PROBNP,  in the last 8760 hours CBG:  Recent Labs Lab 06/13/13 1653 06/13/13 2125 06/14/13 0712 06/14/13 0814 06/14/13 1158  GLUCAP 105* 115* 109* 113* 120*    Recent Results (from the past 240 hour(s))  URINE CULTURE     Status: None   Collection Time    06/11/13  3:03 AM      Result Value Range Status   Specimen Description URINE, RANDOM   Final   Special Requests ADDED [id number]   Final   Culture  Setup Time     Final   Value: 06/11/2013 09:49  Performed at [facility]     Final   Value: 40,000 COLONIES/ML     Performed at [facility]   Culture     Final   Value: Multiple bacterial morphotypes present, none predominant. Suggest appropriate recollection if clinically indicated.     Performed at [facility]   Report Status 06/12/2013 FINAL   Final     Studies: No results found.  Scheduled Meds: . amLODipine  10 mg Oral Daily  . aspirin  300 mg Rectal Daily   Or  . aspirin  325 mg Oral Daily  . heparin  5,000 Units Subcutaneous Q8H  . influenza vac split quadrivalent PF  0.5 mL Intramuscular Tomorrow-1000  . insulin aspart  0-9 Units Subcutaneous TID WC  . levofloxacin  500 mg Oral Q24H  . metroNIDAZOLE  500 mg Oral Q12H  . pneumococcal 23 valent vaccine  0.5 mL Intramuscular Tomorrow-1000  . ramipril  1.25 mg Oral Daily  .  simvastatin  20 mg Oral q1800  . sodium chloride  3 mL Intravenous Q12H   Continuous Infusions:   Principal Problem:   CVA (cerebral infarction) Active Problems:   History of alcohol use   HTN (hypertension)   Diabetes   Memory loss   Kidney disease, chronic, stage III (GFR 30-59 ml/min)   Diastolic congestive heart failure, NYHA class 1   LVH (left ventricular hypertrophy) due to hypertensive disease   [facility] Pager [phone]. If 7PM-7AM, please contact night-coverage at [email or web address], password [facility] 06/14/2013, 1:26 PM  LOS: 4 days

## 2013-06-14 NOTE — Progress Notes (Signed)
Report given to [employer] from [place], [place]. All belongs sent with patient. She went with ambulance in stable condition. No complaints voiced at this time.  Kathryn Cook 06/13/13 1500

## 2013-06-14 NOTE — Progress Notes (Signed)
Clinical Education officer, museum (CSW) provided RN with facility contact to give report. CSW confirmed with facility that [facility] can arrange transport. DC packet placed in chart, pt and sister aware. [facility] called for transport. No additional needs, CSW signing off.  Hunt Oris, MSW, [place]

## 2013-07-09 ENCOUNTER — Ambulatory Visit: Payer: Medicaid Other | Admitting: Family Medicine

## 2013-07-10 ENCOUNTER — Ambulatory Visit (INDEPENDENT_AMBULATORY_CARE_PROVIDER_SITE_OTHER): Payer: Medicaid Other | Admitting: Family Medicine

## 2013-07-10 VITALS — BP 148/70 | Temp 98.1°F | Wt 207.4 lb

## 2013-07-10 DIAGNOSIS — I639 Cerebral infarction, unspecified: Secondary | ICD-10-CM

## 2013-07-10 DIAGNOSIS — I10 Essential (primary) hypertension: Secondary | ICD-10-CM

## 2013-07-10 DIAGNOSIS — E119 Type 2 diabetes mellitus without complications: Secondary | ICD-10-CM

## 2013-07-10 DIAGNOSIS — I6350 Cerebral infarction due to unspecified occlusion or stenosis of unspecified cerebral artery: Secondary | ICD-10-CM

## 2013-07-10 MED ORDER — BLOOD GLUCOSE MONITOR SYSTEM W/DEVICE KIT: PACK | Status: DC

## 2013-07-10 MED ORDER — LISINOPRIL 5 MG PO TABS: 5.0000 mg | ORAL_TABLET | Freq: Every day | ORAL | Status: DC

## 2013-07-10 NOTE — Progress Notes (Signed)
  Subjective:    Patient ID: DEBORH Cook, female    DOB: 23-Mar-1951, 62 y.o.   MRN: [id number]  HPI Comments: Kathryn Cook is a 62 y.o AAF here for hospital follow up.   She went in to [facility] and stayed from 9/14-9/18. At that time, she was found by family at home alone, in bed and not really responding appropriately to questions. She says today that she was trying to talk but couldn't. They carried her to the ER for evaluation and she had a stroke workup which confirmed a CVA. She didn't have any residual weakness from the stroke. She was discharged to [facility] but left there to go to [facility]. This is a care home with 24 hour care.  She moved there in order to be closer to her family.  She is accompanied today with an associate of the care home. She has forms that need to be completed as well stating she needs the assessment in order to get the 24 hour care along with glucose stick monitoring instructions. She also needs a RX for diabetic testing supplies if glucose monitoring was needed and indicated while at the care home.    She has no concerns at this point. She has been doing well and has been compliant with her medications.  Her medications have been updated and there are also prn medications that the care home may give on an as needed basis.      Review of Systems  Constitutional: Negative for activity change, appetite change and unexpected weight change.  HENT: Negative for hearing loss and voice change.   Respiratory: Negative for cough, chest tightness, shortness of breath and wheezing.   Cardiovascular: Negative for chest pain and palpitations.  Gastrointestinal: Negative for nausea, vomiting, abdominal pain, diarrhea and constipation.  Endocrine: Negative for cold intolerance, heat intolerance, polydipsia and polyuria.  Genitourinary: Negative for dysuria and difficulty urinating.  Musculoskeletal: Negative for arthralgias and joint swelling.  Neurological: Negative  for dizziness, speech difficulty, weakness, numbness and headaches.  Psychiatric/Behavioral: Positive for confusion. Negative for sleep disturbance and agitation. The patient is not nervous/anxious.        Dementia        Objective:   Physical Exam  Nursing note and vitals reviewed. Constitutional: She appears well-developed and well-nourished.  HENT:  Head: Normocephalic and atraumatic.  Right Ear: External ear normal.  Left Ear: External ear normal.  Cardiovascular: Normal rate, regular rhythm and normal heart sounds.   Pulmonary/Chest: Effort normal and breath sounds normal.  Skin: Skin is warm and dry.  Psychiatric: She has a normal mood and affect. Her behavior is normal.          Assessment & Plan:  Daritza was seen today for follow-up.  Diagnoses and associated orders for this visit:  Diabetes - Blood Glucose Monitoring Suppl (BLOOD GLUCOSE MONITOR SYSTEM) W/DEVICE KIT; Check sugars as directed  HTN (hypertension) - lisinopril (PRINIVIL,ZESTRIL) 5 MG tablet; Take 1 tablet (5 mg total) by mouth daily.  Medications updated and forms completed for care home. Will check sugars at bedtime. Will also send in rx for diabetes testing supplies.   To follow up in 3 months or sooner if needed.

## 2013-07-24 ENCOUNTER — Encounter: Payer: Self-pay | Admitting: Family Medicine

## 2013-07-24 ENCOUNTER — Ambulatory Visit (INDEPENDENT_AMBULATORY_CARE_PROVIDER_SITE_OTHER): Payer: Medicaid Other | Admitting: Family Medicine

## 2013-07-24 VITALS — BP 158/88 | HR 68 | Temp 98.0°F | Resp 18 | Ht 65.0 in | Wt 208.5 lb

## 2013-07-24 DIAGNOSIS — R3989 Other symptoms and signs involving the genitourinary system: Secondary | ICD-10-CM

## 2013-07-24 DIAGNOSIS — R059 Cough, unspecified: Secondary | ICD-10-CM

## 2013-07-24 DIAGNOSIS — R198 Other specified symptoms and signs involving the digestive system and abdomen: Secondary | ICD-10-CM

## 2013-07-24 DIAGNOSIS — R05 Cough: Secondary | ICD-10-CM

## 2013-07-24 MED ORDER — SULFAMETHOXAZOLE-TMP DS 800-160 MG PO TABS: 1.0000 | ORAL_TABLET | Freq: Two times a day (BID) | ORAL | Status: DC

## 2013-07-24 NOTE — Progress Notes (Signed)
  Subjective:    Patient ID: Kathryn Cook, female    DOB: December 04, 1950, 62 y.o.   MRN: [id number]  HPI Pt here with cough and bleeding from vaginal area.   Blood per pt comes from side of vagina where there is a sore that comes up periodically. Most recently it arose 2 weeks ago and has been oozing blood and pus. It does not hurt or itch.   Cough nonproductive, feels dry and chesty, going on for 3-4 weeks, last 2 weeks have been more bothersome, no fevers/GI sx/pain. She denies runny nose or other sx of URI or allergies. Denies feeling fluid overload. Recent hospitalization showed mild CHF (NYHA class 1). Weight up 1# in past 10 days, 7# in past 6 weeks. Takes lisinopril and hctz among her meds, both of which are new in past 2 mos.      Review of Systems per hpi     Objective:   Physical Exam  Nursing note and vitals reviewed. Constitutional: She is oriented to person, place, and time. She appears well-developed and well-nourished. overweight HENT:  Nose: Nose normal. Turbinates nonboggy. Mouth/Throat: Oropharynx is clear and moist. No oropharyngeal exudate.  Eyes: Conjunctivae are normal. Pupils are equal, round, and reactive to light.  Neck: Normal range of motion. Neck supple. No thyromegaly present.  Cardiovascular: Normal rate, regular rhythm and normal heart sounds.   Pulmonary/Chest: Effort normal and breath sounds normal. no rales, rhonchi, wheezes or crackles.  Abdominal: Soft. Bowel sounds are normal. She exhibits no distension. There is no tenderness. There is no rebound.  Lymphadenopathy:    She has no cervical adenopathy.  Neurological: She is alert and oriented to person, place, and time. She has normal reflexes.  Skin: Skin is warm and dry.  Psychiatric: She has a normal mood and affect. Her behavior is normal.  Genital - lateral to left labia mjora open sore with scant blood draining. Solitary.       Assessment & Plan:  Cough - cxr - if no signs of pna/fluid  will consider changing to arb.   Genital sore - culture taken today. Sitz baths in warm water with epsom salts daily. Bactrim x10 days. Try to keep area clean and dry.   rtc 2 weeks f/u both issues, earlier if needed. Need to ensure we know where this ocugh is coming from. She will call with questions or concerns if needed earlier. At that time can f/u on issues and schedukle her cpe.

## 2013-07-24 NOTE — Patient Instructions (Signed)

## 2013-07-25 NOTE — Addendum Note (Signed)
Addended by: Doran Heater on: 07/25/2013 09:24 AM   Modules accepted: Orders

## 2013-07-27 ENCOUNTER — Telehealth: Payer: Self-pay | Admitting: *Deleted

## 2013-07-27 NOTE — Telephone Encounter (Signed)
Assisted living home called and stated that her cough is not any better.  Wants to know if you will call in something else for her.  She is coughing to the point that she is crying.   RX care

## 2013-07-27 NOTE — Telephone Encounter (Signed)
If she is getting worse she needs to be seen to sort out what the problem is.  I recommend she be seen in urgent care or the ED.

## 2013-07-28 LAB — WOUND CULTURE: Gram Stain: NONE SEEN

## 2013-08-07 ENCOUNTER — Ambulatory Visit: Payer: Medicaid Other | Admitting: Family Medicine

## 2013-08-20 ENCOUNTER — Telehealth: Payer: Self-pay | Admitting: *Deleted

## 2013-08-20 ENCOUNTER — Other Ambulatory Visit: Payer: Self-pay | Admitting: Family Medicine

## 2013-08-20 NOTE — Telephone Encounter (Signed)
Woman called from [facility] and stated that they had requested a refill on pt cough med and pharamcy stated we did not refill it. Nurse did not refill medication due to it stated to take for 72 hours and I did not see where it came from this practice. Woman stated that pt has a "hacking cough" and needs cough medication. Will route to MD but called [facility] back and set up an appointment for pt to come in and see MD regarding cough.

## 2013-08-21 ENCOUNTER — Encounter: Payer: Self-pay | Admitting: Nurse Practitioner

## 2013-08-21 ENCOUNTER — Ambulatory Visit (INDEPENDENT_AMBULATORY_CARE_PROVIDER_SITE_OTHER): Payer: Medicaid Other | Admitting: Nurse Practitioner

## 2013-08-21 VITALS — BP 156/92 | HR 89 | Temp 98.5°F

## 2013-08-21 DIAGNOSIS — F32A Depression, unspecified: Secondary | ICD-10-CM

## 2013-08-21 DIAGNOSIS — F329 Major depressive disorder, single episode, unspecified: Secondary | ICD-10-CM

## 2013-08-21 DIAGNOSIS — I6350 Cerebral infarction due to unspecified occlusion or stenosis of unspecified cerebral artery: Secondary | ICD-10-CM

## 2013-08-21 DIAGNOSIS — R413 Other amnesia: Secondary | ICD-10-CM

## 2013-08-21 DIAGNOSIS — I639 Cerebral infarction, unspecified: Secondary | ICD-10-CM

## 2013-08-21 DIAGNOSIS — F3289 Other specified depressive episodes: Secondary | ICD-10-CM

## 2013-08-21 MED ORDER — ESCITALOPRAM OXALATE 10 MG PO TABS: 10.0000 mg | ORAL_TABLET | Freq: Every day | ORAL | Status: DC

## 2013-08-21 NOTE — Progress Notes (Signed)
[facility]  PATIENT: Kathryn Cook DOB: 1951/01/28   REASON FOR VISIT: post-hospital follow up for stroke HISTORY FROM: patient  HISTORY OF PRESENT ILLNESS: Kathryn Cook is an 62 y.o. female who was recently diagnosed hypertension and diabetes mellitus, who saw a physician for the first time few weeks ago, prolonged history of smoking, alcohol and recreational drug abuse including crack cocaine, quit all substance abuse and smoking about 3 months ago, also carries a diagnosis of early dementia (question if this is vascular dementia secondary to multiple occult strokes).  Family noticed upon her awakening at 1100 on 06/10/2013 that she had right sided weakness, slurred speech and facial droop. En route CBG 107. BP 210/122. NIHSS=8. Patient was not a TPA candidate secondary to delay in arrival.   MRI of the brain 06/10/2013 showed acute non hemorrhagic punctate infarcts of the left thalamus, internal capsule, and insular cortex.  Acute non hemorrhagic punctate infarcts of the right internal capsule.  Atrophy and extensive white matter disease is advanced for age.  Remote lacunar infarcts of the thalami and basal ganglia bilaterally.   MRA of the brain showed moderate small vessel disease.  Moderate stenosis of the right P1 segment. Atherosclerotic changes within the cavernous carotid arteries bilaterally as well as the right vertebral artery.  2D Echocardiogram showed an EF 55-60% with no source of embolus.  Carotid Doppler with no evidence of hemodynamically significant internal carotid artery stenosis.  Patient had right hemiparesis, and slurred speech which is resolved.  Family states that her short term memory was really affected.  She is living at [facility], a 24 hour care home.  REVIEW OF SYSTEMS: Full 14 system review of systems performed and notable only for:  Cardiovascular: palpitations, swelling in legs Respiratory: short of breath, cough, snoring    Endocrine: feeling hot Allergy/Immunology: runny nose Neurological: memory loss Psychiatric: depression, not enough sleep Sleep: snoring  ALLERGIES: No Known Allergies  HOME MEDICATIONS: Outpatient Prescriptions Prior to Visit  Medication Sig Dispense Refill  . amLODipine (NORVASC) 10 MG tablet TAKE ONE TABLET BY MOUTH ONCE DAILY.  30 tablet  1  . aspirin EC 325 MG tablet TAKE ONE TABLET BY MOUTH ONCE DAILY.  30 tablet  1  . Blood Glucose Monitoring Suppl (BLOOD GLUCOSE MONITOR SYSTEM) W/DEVICE KIT Check sugars as directed  1 each  0  . hydrochlorothiazide (HYDRODIURIL) 25 MG tablet TAKE ONE TABLET BY MOUTH ONCE DAILY.  30 tablet  1  . lisinopril (PRINIVIL,ZESTRIL) 5 MG tablet TAKE ONE TABLET BY MOUTH ONCE DAILY.  30 tablet  1  . metFORMIN (GLUCOPHAGE) 500 MG tablet TAKE (1) TABLET BY MOUTH TWICE A DAY WITH MEALS (BREAKFAST AND SUPPER).  60 tablet  1  . simvastatin (ZOCOR) 20 MG tablet TAKE 1 TABLET BY MOUTH ONCE DAILY AT 6:00 PM.  30 tablet  1  . aspirin 325 MG tablet Take 1 tablet (325 mg total) by mouth daily.      Marland Kitchen sulfamethoxazole-trimethoprim (BACTRIM DS) 800-160 MG per tablet Take 1 tablet by mouth 2 (two) times daily.  20 tablet  0   No facility-administered medications prior to visit.    PAST MEDICAL HISTORY: Past Medical History  Diagnosis Date  . Hypertension   . Bronchitis   . Diabetes mellitus without complication   . Substance abuse     alcohol, but patient states she doesn't drink that much anymore    PAST SURGICAL HISTORY: No past surgical history on file.  FAMILY HISTORY: No family history on file.  SOCIAL HISTORY: History   Social History  . Marital Status: Single    Spouse Name: N/A    Number of Children: N/A  . Years of Education: N/A   Occupational History  . Not on file.   Social History Main Topics  . Smoking status: Current Some Day Smoker -- 0.25 packs/day for 45 years    Types: Cigarettes  . Smokeless tobacco: Never Used  . Alcohol  Use: 1.2 oz/week    2 Cans of beer per week     Comment: occasional  . Drug Use: No     Comment: last used 3 months ago  . Sexual Activity: Yes   Other Topics Concern  . Not on file   Social History Narrative  . No narrative on file     PHYSICAL EXAM  Filed Vitals:   08/21/13 1359  BP: 156/92  Pulse: 89  Temp: 98.5 F (36.9 C)  TempSrc: Oral   There is no weight on file to calculate BMI.  Generalized: Well developed, in no acute distress, Pleasant african [place] female. Head: normocephalic and atraumatic. Oropharynx benign  Neck: Supple, no carotid bruits  Cardiac: Regular rate rhythm, no murmur  Musculoskeletal: No deformity   Neurological examination  Mentation: Alert, diminished attention, registration and recall. MMSE 19/30 with Poor short term memory recall 0/3. Decrease animal naming, score 5 (normal 12+).  Clock Drawing 0/4.  Geriatric Depression Score 10 (indicates depression). Cranial nerve II-XII:  Pupils were equal round reactive to light extraocular movements were full, visual field were full on confrontational test. Mild right lower face weakness. Tongue midline. No drift, mild right grip weakness.  hearing was intact to finger rubbing bilaterally. Uvula tongue midline. Head turning and shoulder shrug and were normal and symmetric. Motor: normal bulk and tone, full strength in the BUE, BLE, fine finger movements normal, no pronator drift. No focal weakness Sensory: normal and symmetric to light touch, pinprick, and  vibration  Coordination: finger-nose-finger, heel-to-shin bilaterally, no dysmetria Reflexes:  Deep tendon reflexes in the upper and lower extremities are present and symmetric.  Gait and Station: Rising up from seated position without assistance, normal stance, without trunk ataxia, moderate stride, good arm swing, smooth turning, able to perform tiptoe, and heel walking without difficulty.   DIAGNOSTIC DATA (LABS, IMAGING, TESTING) - I reviewed  patient records, labs, notes, testing and imaging myself where available.  Lab Results  Component Value Date   WBC 6.8 06/11/2013   HGB 12.3 06/11/2013   HCT 38.1 06/11/2013   MCV 87.6 06/11/2013   PLT 231 06/11/2013      Component Value Date/Time   NA 141 06/11/2013 0550   K 3.7 06/11/2013 0550   CL 104 06/11/2013 0550   CO2 28 06/11/2013 0550   GLUCOSE 98 06/11/2013 0550   BUN 20 06/11/2013 0550   CREATININE 0.87 06/11/2013 0550   CREATININE 0.95 05/31/2013 1209   CALCIUM 9.3 06/11/2013 0550   PROT 7.1 06/10/2013 1252   ALBUMIN 3.2* 06/10/2013 1252   AST 15 06/10/2013 1252   ALT 9 06/10/2013 1252   ALKPHOS 67 06/10/2013 1252   BILITOT 0.2* 06/10/2013 1252   GFRNONAA 70* 06/11/2013 0550   GFRAA 82* 06/11/2013 0550   Lab Results  Component Value Date   CHOL 230* 06/11/2013   HDL 94 06/11/2013   LDLCALC 124* 06/11/2013   TRIG 58 06/11/2013   CHOLHDL 2.4 06/11/2013   Lab Results  Component Value  Date   HGBA1C 7.0* 06/11/2013   Lab Results  Component Value Date   [id number] 05/31/2013   Lab Results  Component Value Date   TSH 2.108 05/31/2013    ASSESSMENT AND PLAN Ms. Kathryn Cook is a 62 y.o. female presenting with right sided weakness. Imaging confirms left thalamus, internal capsule, and insular cortex infarcts as well as right internal capsule infarcts. Infarcts felt to be thrombotic secondary to small vessel disease given location and uncontrolled risk factors. Patient with resultant cognitive deficits, MMSE 19/30; may be compounded by untreated depression.  Will start anti-depressant and re-test in 3 months.  If no improvement in MMSE, may consider adding donepizil.  PLAN: Continue aspirin 325 mg orally every day  for secondary stroke prevention and maintain strict control of hypertension with blood pressure goal below 130/90, diabetes with hemoglobin A1c goal below 6.5% and lipids with LDL cholesterol goal below 100 mg/dL.  Start Lexapro 10 mg daily for depression. Followup in  the future with me in 3 months.  Meds ordered this encounter  Medications  . escitalopram (LEXAPRO) 10 MG tablet    Sig: Take 1 tablet (10 mg total) by mouth daily after breakfast.    Dispense:  30 tablet    Refill:  2    Order Specific Question:  Supervising Provider    Answer:  Zada Finders LAM, MSN, NP-C 08/21/2013, 3:48 PM [facility] [address], [place],  [address] [phone]  Note: This document was prepared with digital dictation and possible smart phrase technology. Any transcriptional errors that result from this process are unintentional.

## 2013-08-21 NOTE — Patient Instructions (Addendum)
PLAN: Continue aspirin 325 mg orally every day  for secondary stroke prevention and maintain strict control of hypertension with blood pressure goal below 130/90, diabetes with hemoglobin A1c goal below 6.5% and lipids with LDL cholesterol goal below 100 mg/dL.  Start Lexapro Generic name Escitalopram) 10 mg, 1 tablet daily after breakfast.  Followup in the future with me in 3 months.

## 2013-08-22 ENCOUNTER — Ambulatory Visit: Payer: Self-pay | Admitting: Family Medicine

## 2013-08-27 ENCOUNTER — Ambulatory Visit (INDEPENDENT_AMBULATORY_CARE_PROVIDER_SITE_OTHER): Payer: Medicaid Other | Admitting: Family Medicine

## 2013-08-27 ENCOUNTER — Encounter: Payer: Self-pay | Admitting: Family Medicine

## 2013-08-27 VITALS — BP 160/90 | HR 75 | Temp 97.9°F | Resp 20 | Ht 63.0 in | Wt 210.2 lb

## 2013-08-27 DIAGNOSIS — Z8673 Personal history of transient ischemic attack (TIA), and cerebral infarction without residual deficits: Secondary | ICD-10-CM | POA: Insufficient documentation

## 2013-08-27 DIAGNOSIS — F17200 Nicotine dependence, unspecified, uncomplicated: Secondary | ICD-10-CM

## 2013-08-27 DIAGNOSIS — R05 Cough: Secondary | ICD-10-CM

## 2013-08-27 DIAGNOSIS — I10 Essential (primary) hypertension: Secondary | ICD-10-CM

## 2013-08-27 DIAGNOSIS — F0150 Vascular dementia without behavioral disturbance: Secondary | ICD-10-CM | POA: Insufficient documentation

## 2013-08-27 DIAGNOSIS — E119 Type 2 diabetes mellitus without complications: Secondary | ICD-10-CM

## 2013-08-27 NOTE — Progress Notes (Signed)
Subjective:     Patient ID: Kathryn Cook, female   DOB: 06/24/1951, 62 y.o.   MRN: [id number]  Cough This is a new problem. The current episode started more than 1 month ago. The problem has been unchanged. The problem occurs constantly. The cough is non-productive. Pertinent negatives include no chest pain, chills, ear congestion, ear pain, fever, headaches, heartburn, myalgias, nasal congestion, postnasal drip, rash, rhinorrhea, sore throat, shortness of breath, weight loss or wheezing. The symptoms are aggravated by lying down. She has tried prescription cough suppressant and OTC cough suppressant for the symptoms. The treatment provided no relief. There is no history of environmental allergies. tobacco use   Kathryn Cook does have hx of CVA in September 2014. She is a known diabetic with HTN as well.  She stays in an assisted living facility currently. She has been seen for this cough on 10/28. At that time, she reported the cough had been going on for 3-4 weeks. She was sent for xray of her chest and apparently, this never was done? She was also started on several new medicines at hospital discharge on 06/14/13; one to include Lisinopril 5 mg.  She doesn't report anything else occuring with the cough. The cough appears to be mostly at bedtime but occurs all day. She has done cough syrup which hasn't helped any.  Past Medical History  Diagnosis Date  . Hypertension   . Bronchitis   . Diabetes mellitus without complication   . Substance abuse     alcohol, but patient states she doesn't drink that much anymore   Current Outpatient Prescriptions on File Prior to Visit  Medication Sig Dispense Refill  . amLODipine (NORVASC) 10 MG tablet TAKE ONE TABLET BY MOUTH ONCE DAILY.  30 tablet  1  . aspirin EC 325 MG tablet TAKE ONE TABLET BY MOUTH ONCE DAILY.  30 tablet  1  . Blood Glucose Monitoring Suppl (BLOOD GLUCOSE MONITOR SYSTEM) W/DEVICE KIT Check sugars as directed  1 each  0  .  escitalopram (LEXAPRO) 10 MG tablet Take 1 tablet (10 mg total) by mouth daily after breakfast.  30 tablet  2  . hydrochlorothiazide (HYDRODIURIL) 25 MG tablet TAKE ONE TABLET BY MOUTH ONCE DAILY.  30 tablet  1  . lisinopril (PRINIVIL,ZESTRIL) 5 MG tablet TAKE ONE TABLET BY MOUTH ONCE DAILY.  30 tablet  1  . metFORMIN (GLUCOPHAGE) 500 MG tablet TAKE (1) TABLET BY MOUTH TWICE A DAY WITH MEALS (BREAKFAST AND SUPPER).  60 tablet  1  . simvastatin (ZOCOR) 20 MG tablet TAKE 1 TABLET BY MOUTH ONCE DAILY AT 6:00 PM.  30 tablet  1   No current facility-administered medications on file prior to visit.    Review of Systems  Constitutional: Negative for fever, chills, weight loss and fatigue.  HENT: Negative for congestion, ear pain, postnasal drip, rhinorrhea, sneezing, sore throat and voice change.   Eyes: Negative for visual disturbance.  Respiratory: Positive for cough. Negative for choking, chest tightness, shortness of breath, wheezing and stridor.        Denies hemoptysis   Cardiovascular: Negative for chest pain and palpitations.  Gastrointestinal: Negative for heartburn, nausea, vomiting, abdominal pain and diarrhea.  Endocrine: Negative for heat intolerance.  Genitourinary: Negative for dysuria, urgency, frequency, hematuria and flank pain.  Musculoskeletal: Negative for back pain and myalgias.  Skin: Negative for color change and rash.  Allergic/Immunologic: Negative for environmental allergies and immunocompromised state.  Neurological: Negative for syncope, weakness, numbness and headaches.  Psychiatric/Behavioral: Negative for hallucinations and agitation.       Memory deficits secondary to vascular dementia        Objective:   Physical Exam  Nursing note and vitals reviewed. Constitutional: She is oriented to person, place, and time. She appears well-developed.  HENT:  Head: Normocephalic and atraumatic.  Right Ear: External ear normal.  Left Ear: External ear normal.  Nose:  Nose normal.  Mouth/Throat: Oropharynx is clear and moist.  Eyes: Conjunctivae are normal. Pupils are equal, round, and reactive to light.  Cardiovascular: Normal rate, regular rhythm, normal heart sounds and intact distal pulses.   Pulmonary/Chest: Effort normal and breath sounds normal. No respiratory distress. She has no wheezes. She exhibits no tenderness.  Abdominal: Soft. Bowel sounds are normal. She exhibits no distension. There is no tenderness.  Musculoskeletal: Normal range of motion.  Neurological: She is alert and oriented to person, place, and time.  Skin: Skin is warm and dry.  Psychiatric: She has a normal mood and affect. Her behavior is normal.  Thought content normal in regards to the cough       Assessment:     Kathryn Cook was seen today for cough.  Diagnoses and associated orders for this visit:  Cough - DG Chest 2 View; Future  Tobacco use disorder  H/O: CVA (cerebrovascular accident)  Diabetes  HTN (hypertension)  Vascular dementia         Plan:     Will get chest xray today as the patient is a smoker and is noted to have oxygen saturations of 93% on room air. No respiratory distress noted today. Have counseled on smoking cessation and she has cut back a great deal since her CVA in September.   She also has HTN and Diabetes and was started on an ACE inhibitor at hospital discharge. Have instructed to stay off of this medicine for now and see if her cough improves. If so, will need to think about going back on ACEI and adding iron supplementation to see if this resolves the cough or changing to ARB. There have been studies that shown a cough persists even with ARB treatment but may do a trial of this and see if she continues to have the cough.  Her blood pressure a little elevated and will need to add another medicine to get better control of her blood pressure to goal of <140/90 due to her hx of diabetes and cardiac risk factors.  She has vascular dementia  and may benefit from namenda/aricept? Will readdress during next visit once chest xray is completed and cough is resolved.

## 2013-08-27 NOTE — Patient Instructions (Signed)
Stop taking the Lisinopril for now to see if the cough resolves.   Will contact you when xray returns. Report directly to [facility] for Chest xray to evaluate for the cough.

## 2013-08-28 ENCOUNTER — Ambulatory Visit (HOSPITAL_COMMUNITY)
Admission: RE | Admit: 2013-08-28 | Discharge: 2013-08-28 | Disposition: A | Discharge status: Home / Self Care | Payer: Medicaid Other | Source: Ambulatory Visit | Attending: Family Medicine | Admitting: Family Medicine

## 2013-08-28 DIAGNOSIS — R0902 Hypoxemia: Secondary | ICD-10-CM | POA: Insufficient documentation

## 2013-08-28 DIAGNOSIS — R05 Cough: Secondary | ICD-10-CM

## 2013-08-28 DIAGNOSIS — R059 Cough, unspecified: Secondary | ICD-10-CM | POA: Insufficient documentation

## 2013-08-28 DIAGNOSIS — J438 Other emphysema: Secondary | ICD-10-CM | POA: Insufficient documentation

## 2013-08-28 DIAGNOSIS — Z87891 Personal history of nicotine dependence: Secondary | ICD-10-CM | POA: Insufficient documentation

## 2013-08-29 ENCOUNTER — Telehealth: Payer: Self-pay | Admitting: Family Medicine

## 2013-08-29 DIAGNOSIS — J449 Chronic obstructive pulmonary disease, unspecified: Secondary | ICD-10-CM

## 2013-08-29 MED ORDER — TIOTROPIUM BROMIDE MONOHYDRATE 18 MCG IN CAPS: 18.0000 ug | ORAL_CAPSULE | Freq: Two times a day (BID) | RESPIRATORY_TRACT | Status: DC

## 2013-08-29 NOTE — Telephone Encounter (Signed)
Both readings at 8am: BP- 141/91 P-80 161/64 P-79  The readings were done the same time as the medication administration. Her Lisinopril was stopped on 12/1 and the caregiver Lenore Mcdaniel and [facility] says that she hasn't heard the patient coughing. Her cxr was negative for pneumonia but did show COPD changes. Due to her hypoxia on last 2 encounters, I sent in spiriva for COPD and have stopped the Lisinopril as the cough is likely from this medicine. Have added this to her allergy list. To follow up on Monday and she will have blood pressure checks daily at 3pm. They will keep a bp log and bring this in with the patient at follow up on Monday.

## 2013-08-30 ENCOUNTER — Other Ambulatory Visit: Payer: Self-pay | Admitting: Family Medicine

## 2013-08-30 ENCOUNTER — Telehealth: Payer: Self-pay | Admitting: *Deleted

## 2013-08-30 DIAGNOSIS — J449 Chronic obstructive pulmonary disease, unspecified: Secondary | ICD-10-CM

## 2013-08-30 MED ORDER — TIOTROPIUM BROMIDE MONOHYDRATE 18 MCG IN CAPS: 18.0000 ug | ORAL_CAPSULE | Freq: Every day | RESPIRATORY_TRACT | Status: DC

## 2013-08-30 NOTE — Telephone Encounter (Signed)
I have corrected thank you

## 2013-08-30 NOTE — Telephone Encounter (Signed)
Man called from pharmacy and left VM questioning order for Spiriva. Stated order was for BID but it is usually given QD. Needed clarification. Will route to MD.

## 2013-08-30 NOTE — Progress Notes (Signed)
I have corrected. Thank you.

## 2013-09-03 ENCOUNTER — Telehealth: Payer: Self-pay | Admitting: *Deleted

## 2013-09-03 ENCOUNTER — Ambulatory Visit: Payer: Medicaid Other | Admitting: Family Medicine

## 2013-09-03 NOTE — Telephone Encounter (Signed)
Kathryn Cook from pharmacy called and stated that he needed to clarify order for pt Spiriva that it is usually written for QD and pt was written BID and he needed to clarify. Nurse informed him that it was resent over on 08/30/2013 with clarification that medication is QD not BID. He was appreciative and understanding.

## 2013-09-03 NOTE — Telephone Encounter (Signed)
Tanya from [facility] called and left VM on nurse line that pt had an appointment and that if it was this morning they needed to reschedule to this afternoon if possible. Nurse attempted to call number x 2 and received busy signal both times.

## 2013-09-11 ENCOUNTER — Telehealth: Payer: Self-pay | Admitting: Family Medicine

## 2013-09-11 NOTE — Telephone Encounter (Signed)
Patient missed appt on 09/03/13. She was suppose to have been seen at that time but spoke to Tania Martinique supervisor in charge and she stated that she rescheduled the patient's appt. The patient was to follow up of her cough after stopping the ACEI and her cxr was negative except for COPD. She was given rx for spiriva. I received a fax stating that she continues to cough and her acei is held. She was suppose to be seen on 12/8 with her bp log. She will need to get in to see me for this and I tried to transfer call to receptionist but unable to be reached. Will forward this note and Doy Mince will call the office back to get this appt.

## 2013-10-01 ENCOUNTER — Encounter: Payer: Self-pay | Admitting: Family Medicine

## 2013-10-01 ENCOUNTER — Ambulatory Visit (INDEPENDENT_AMBULATORY_CARE_PROVIDER_SITE_OTHER): Payer: Medicaid Other | Admitting: Family Medicine

## 2013-10-01 VITALS — BP 164/98 | HR 88 | Temp 98.6°F | Resp 20 | Ht 65.0 in | Wt 213.5 lb

## 2013-10-01 DIAGNOSIS — E119 Type 2 diabetes mellitus without complications: Secondary | ICD-10-CM

## 2013-10-01 DIAGNOSIS — IMO0002 Reserved for concepts with insufficient information to code with codable children: Secondary | ICD-10-CM | POA: Insufficient documentation

## 2013-10-01 DIAGNOSIS — E1122 Type 2 diabetes mellitus with diabetic chronic kidney disease: Secondary | ICD-10-CM | POA: Insufficient documentation

## 2013-10-01 DIAGNOSIS — I10 Essential (primary) hypertension: Secondary | ICD-10-CM

## 2013-10-01 LAB — BASIC METABOLIC PANEL
BUN: 31 mg/dL — ABNORMAL HIGH (ref 6–23)
CALCIUM: 9.2 mg/dL (ref 8.4–10.5)
CO2: 31 meq/L (ref 19–32)
CREATININE: 1.37 mg/dL — AB (ref 0.50–1.10)
Chloride: 101 mEq/L (ref 96–112)
GLUCOSE: 174 mg/dL — AB (ref 70–99)
Potassium: 4.1 mEq/L (ref 3.5–5.3)
SODIUM: 140 meq/L (ref 135–145)

## 2013-10-01 MED ORDER — VALSARTAN 80 MG PO TABS: 80.0000 mg | ORAL_TABLET | Freq: Every day | ORAL | Status: DC

## 2013-10-01 NOTE — Progress Notes (Signed)
Subjective:    Patient here for follow-up of elevated blood pressure. She is a resident of [facility] assisted living facility. She has been under my care since September. She recently suffered from a CVA and has been doing well after this. She has dx of HTN. HLD, recent CVA, DM, and mild dementia. She was noted to have a cough and it was noted that the cough was worse at bedtime. She was on an ACEI after the stoke along with a few other medications. I got a CXR on Ms [place] and this only showed hyperinflation consistent with COPD. She does have a hx of tobacco abuse as well as alcohol abuse. She has stopped using alcohol but still smokes some. She was given Spiriva for her COPD.   Her Lisinopril was held for a few weeks to see if her cough would improve. She is here for follow up of this. There is a note from the staff stating that the cough has improved. I have added the ACEI to her allergy list today. She has no concerns today and says she had a good holiday. She is here with a blood pressure log as well as blood glucose log. Her sugars are running 130s-200s. Her bp is elevated and she isn't at goal, post CVA.  She is not exercising and is adherent to a low-salt diet.  Blood pressure is not well controlled at assisted living facility. Cardiac symptoms: none. Patient denies: chest pain, claudication, dyspnea, fatigue, irregular heart beat, lower extremity edema, palpitations and syncope. Cardiovascular risk factors: advanced age (older than 20 for men, 55 for women), diabetes mellitus, dyslipidemia, hypertension, sedentary lifestyle and smoking/ tobacco exposure. Use of agents associated with hypertension: none. History of target organ damage: heart failure, stroke and diastolic dysfunction Heart failure.  The following portions of the patient's history were reviewed and updated as appropriate:  She  has a past medical history of Hypertension; Bronchitis; Diabetes mellitus without complication; and  Substance abuse. She  does not have any pertinent problems on file. Her family history is not on file. She  reports that she has been smoking Cigarettes.  She has a 11.25 pack-year smoking history. She has never used smokeless tobacco. She reports that she drinks about 1.2 ounces of alcohol per week. She reports that she does not use illicit drugs. Current Outpatient Prescriptions on File Prior to Visit  Medication Sig Dispense Refill  . amLODipine (NORVASC) 10 MG tablet TAKE ONE TABLET BY MOUTH ONCE DAILY.  30 tablet  1  . aspirin EC 325 MG tablet TAKE ONE TABLET BY MOUTH ONCE DAILY.  30 tablet  1  . Blood Glucose Monitoring Suppl (BLOOD GLUCOSE MONITOR SYSTEM) W/DEVICE KIT Check sugars as directed  1 each  0  . escitalopram (LEXAPRO) 10 MG tablet Take 1 tablet (10 mg total) by mouth daily after breakfast.  30 tablet  2  . hydrochlorothiazide (HYDRODIURIL) 25 MG tablet TAKE ONE TABLET BY MOUTH ONCE DAILY.  30 tablet  1  . metFORMIN (GLUCOPHAGE) 500 MG tablet TAKE (1) TABLET BY MOUTH TWICE A DAY WITH MEALS (BREAKFAST AND SUPPER).  60 tablet  1  . simvastatin (ZOCOR) 20 MG tablet TAKE 1 TABLET BY MOUTH ONCE DAILY AT 6:00 PM.  30 tablet  1  . tiotropium (SPIRIVA HANDIHALER) 18 MCG inhalation capsule Place 1 capsule (18 mcg total) into inhaler and inhale daily.  30 capsule  12   No current facility-administered medications on file prior to visit.   She  is allergic to ace inhibitors..  Review of Systems Pertinent items are noted in HPI.     Objective:    BP 164/98  Pulse 88  Temp(Src) 98.6 F (37 C) (Temporal)  Resp 20  Ht 5' 5" (1.651 m)  Wt 213 lb 8 oz (96.843 kg)  BMI 35.53 kg/m2  SpO2 93% General appearance: alert, cooperative, appears stated age and no distress Head: Normocephalic, without obvious abnormality, atraumatic Throat: lips, mucosa, and tongue normal; teeth and gums normal Lungs: clear to auscultation bilaterally Heart: regular rate and rhythm and S1, S2  normal Abdomen: soft, non-tender; bowel sounds normal; no masses,  no organomegaly Extremities: extremities normal, atraumatic, no cyanosis or edema Pulses: 2+ and symmetric Neurologic: Grossly normal    Assessment:    Hypertension, stage 2 HTN. Evidence of target organ damage: heart failure and stroke.   Shakerra was seen today for follow-up.  Diagnoses and associated orders for this visit:  HTN (hypertension) - Basic metabolic panel - valsartan (DIOVAN) 80 MG tablet; Take 1 tablet (80 mg total) by mouth daily.  Diabetes mellitus - Basic metabolic panel - Hemoglobin A1c - valsartan (DIOVAN) 80 MG tablet; Take 1 tablet (80 mg total) by mouth daily.    Plan:    Medication: continue HCTZ 46m daily, Norvasc 5 mg daily, discontinue Lisinopril indefinitely  and begin Diovan 80 mg daily. Dietary sodium restriction. Check blood pressures 2 times daily and record. Follow up: 2 weeks and as needed.

## 2013-10-01 NOTE — Patient Instructions (Signed)
Valsartan tablets What is this medicine? VALSARTAN (val SAR tan) is used to treat high blood pressure. This drug is also used to treat patients with heart failure and patients who have had a heart attack. This medicine may be used for other purposes; ask your health care provider or pharmacist if you have questions. COMMON BRAND NAME(S): Diovan What should I tell my health care provider before I take this medicine? They need to know if you have any of these conditions: -heart failure -kidney disease -liver disease -an unusual or allergic reaction to valsartan, other medicines, foods, dyes, or preservatives -pregnant or trying to get pregnant -breast-feeding How should I use this medicine? Take this medicine by mouth with a glass of water. Follow the directions on the prescription label. This medicine can be taken with or without food. Take your medicine at regular intervals. Do not take it more often than directed. Talk to your pediatrician regarding the use of this medicine in children. While this drug may be prescribed for children as young as 6 years for selected conditions, precautions do apply. Overdosage: If you think you have taken too much of this medicine contact a poison control center or emergency room at once. NOTE: This medicine is only for you. Do not share this medicine with others. What if I miss a dose? If you miss a dose, take it as soon as you can. If it is almost time for your next dose, take only that dose. Do not take double or extra doses. What may interact with this medicine? -blood pressure medicines -lithium -diuretics, especially triamterene, spironolactone or amiloride -potassium salts or potassium supplements This list may not describe all possible interactions. Give your health care provider a list of all the medicines, herbs, non-prescription drugs, or dietary supplements you use. Also tell them if you smoke, drink alcohol, or use illegal drugs. Some items may  interact with your medicine. What should I watch for while using this medicine? Visit your doctor or health care professional for regular checks on your progress. Check your blood pressure as directed. Ask your doctor or health care professional what your blood pressure should be and when you should contact him or her. Call your doctor or health care professional if you notice an irregular or fast heart beat. Women should inform their doctor if they wish to become pregnant or think they might be pregnant. There is a potential for serious side effects to an unborn child, particularly in the second or third trimester. Talk to your health care professional or pharmacist for more information. You may get drowsy or dizzy. Do not drive, use machinery, or do anything that needs mental alertness until you know how this drug affects you. Do not stand or sit up quickly, especially if you are an older patient. This reduces the risk of dizzy or fainting spells. Alcohol can make you more drowsy and dizzy. Avoid alcoholic drinks. Avoid salt substitutes unless you are told otherwise by your doctor or health care professional. Do not treat yourself for coughs, colds, or pain while you are taking this medicine without asking your doctor or health care professional for advice. Some ingredients may increase your blood pressure. What side effects may I notice from receiving this medicine? Side effects that you should report to your doctor or health care professional as soon as possible: -confusion, dizziness, light headedness or fainting spells -decreased amount of urine passed -difficulty breathing or swallowing, hoarseness, or tightening of the throat -fast or irregular heart  beat, palpitations, or chest pain -skin rash, itching -swelling of your face, lips, tongue, hands, or feet Side effects that usually do not require medical attention (report to your doctor or health care professional if they continue or are  bothersome): -cough -decreased sexual function -headache -nausea or stomach pain This list may not describe all possible side effects. Call your doctor for medical advice about side effects. You may report side effects to FDA at 1-800-FDA-1088. Where should I keep my medicine? Keep out of the reach of children. Store at room temperature between 15 and 30 degrees C (59 and 86 degrees F). Keep your medicine container tightly closed and protect from moisture. Throw away any unused medicine after the expiration date. NOTE: This sheet is a summary. It may not cover all possible information. If you have questions about this medicine, talk to your doctor, pharmacist, or health care provider.  2014, Elsevier/Gold Standard. (2012-12-14 12:39:59)

## 2013-10-02 LAB — HEMOGLOBIN A1C
HEMOGLOBIN A1C: 6.4 % — AB (ref <5.7)
Mean Plasma Glucose: 137 mg/dL — ABNORMAL HIGH (ref <117)

## 2013-10-10 ENCOUNTER — Ambulatory Visit: Payer: Medicaid Other | Admitting: Family Medicine

## 2013-10-16 ENCOUNTER — Other Ambulatory Visit: Payer: Self-pay | Admitting: Family Medicine

## 2013-10-16 NOTE — Telephone Encounter (Signed)
These may be refilled. Thanks.

## 2013-10-16 NOTE — Telephone Encounter (Signed)
These are the refills she is requesting.

## 2013-10-17 ENCOUNTER — Ambulatory Visit (INDEPENDENT_AMBULATORY_CARE_PROVIDER_SITE_OTHER): Payer: Medicaid Other | Admitting: Family Medicine

## 2013-10-17 ENCOUNTER — Encounter: Payer: Self-pay | Admitting: Family Medicine

## 2013-10-17 VITALS — BP 186/94 | HR 103 | Temp 98.5°F | Resp 24 | Wt 214.1 lb

## 2013-10-17 DIAGNOSIS — R05 Cough: Secondary | ICD-10-CM

## 2013-10-17 DIAGNOSIS — I10 Essential (primary) hypertension: Secondary | ICD-10-CM

## 2013-10-17 DIAGNOSIS — Z8673 Personal history of transient ischemic attack (TIA), and cerebral infarction without residual deficits: Secondary | ICD-10-CM

## 2013-10-17 DIAGNOSIS — F17200 Nicotine dependence, unspecified, uncomplicated: Secondary | ICD-10-CM

## 2013-10-17 DIAGNOSIS — N183 Chronic kidney disease, stage 3 unspecified: Secondary | ICD-10-CM

## 2013-10-17 DIAGNOSIS — R059 Cough, unspecified: Secondary | ICD-10-CM

## 2013-10-17 DIAGNOSIS — F0390 Unspecified dementia without behavioral disturbance: Secondary | ICD-10-CM

## 2013-10-17 DIAGNOSIS — Z720 Tobacco use: Secondary | ICD-10-CM

## 2013-10-18 ENCOUNTER — Telehealth: Payer: Self-pay | Admitting: Family Medicine

## 2013-10-18 DIAGNOSIS — I10 Essential (primary) hypertension: Secondary | ICD-10-CM | POA: Insufficient documentation

## 2013-10-18 DIAGNOSIS — Z720 Tobacco use: Secondary | ICD-10-CM

## 2013-10-18 DIAGNOSIS — F0390 Unspecified dementia without behavioral disturbance: Secondary | ICD-10-CM | POA: Insufficient documentation

## 2013-10-18 LAB — BASIC METABOLIC PANEL
BUN: 26 mg/dL — AB (ref 6–23)
CALCIUM: 9.4 mg/dL (ref 8.4–10.5)
CO2: 29 mEq/L (ref 19–32)
Chloride: 102 mEq/L (ref 96–112)
Creat: 1.14 mg/dL — ABNORMAL HIGH (ref 0.50–1.10)
GLUCOSE: 134 mg/dL — AB (ref 70–99)
Potassium: 4.3 mEq/L (ref 3.5–5.3)
SODIUM: 139 meq/L (ref 135–145)

## 2013-10-18 MED ORDER — CARVEDILOL 3.125 MG PO TABS: 3.1250 mg | ORAL_TABLET | Freq: Two times a day (BID) | ORAL | Status: DC

## 2013-10-18 MED ORDER — NICOTINE 21 MG/24HR TD PT24
MEDICATED_PATCH | TRANSDERMAL | Status: DC
Start: 2013-10-18 — End: 2014-06-10

## 2013-10-18 NOTE — Telephone Encounter (Signed)
Spoke with Trina Ao, Supervisor in charge at [employer]. Will order Ultrasound to rule out RAS. WIll also send in nicoderm patch to help patient stop smoking. She still goes outside to smoke, despite being told the doctor's orders. She also will elave with family members and bring back packs of cigarettes.  Will do ultrasound, send in patches to help patient stop smoking, and start coreg 3.125mg  1 tab po at 8am and 5pm.  Will see back in 2 weeks for blood pressure follow up. Have added ARB to allergy list.  Have sent in nicoderm 21 mg to be applied daily and removed every night. To do this dose for 6 weeks. Then go to 14 mg daily for 2 weeks, then to 7 mg daily for 2 weeks.

## 2013-10-18 NOTE — Progress Notes (Signed)
Subjective:     Patient ID: Kathryn Cook, female   DOB: September 27, 1951, 63 y.o.   MRN: [id number]  Cough This is a recurrent problem. The current episode started more than 1 month ago. The problem has been gradually worsening. The problem occurs every few hours. The cough is non-productive. Pertinent negatives include no chest pain, chills, ear congestion, ear pain, fever, headaches, heartburn, hemoptysis, myalgias, nasal congestion, postnasal drip, rash, rhinorrhea, sore throat, shortness of breath, sweats, weight loss or wheezing. Nothing aggravates the symptoms. Risk factors for lung disease include smoking/tobacco exposure. Treatments tried: Spiriva, diabetic tussin. The treatment provided mild relief. Her past medical history is significant for COPD.  Hypertension This is a chronic problem. The current episode started more than 1 year ago. The problem has been gradually worsening since onset. The problem is uncontrolled. Pertinent negatives include no anxiety, chest pain, headaches, malaise/fatigue, orthopnea, palpitations, peripheral edema, shortness of breath or sweats. There are no associated agents to hypertension. Risk factors for coronary artery disease include diabetes mellitus, dyslipidemia, sedentary lifestyle, smoking/tobacco exposure and post-menopausal state. Past treatments include ACE inhibitors, angiotensin blockers, calcium channel blockers and diuretics. The current treatment provides no improvement. Compliance problems include psychosocial issues (patient lives at [facility] living facility/she has dementia).  Hypertensive end-organ damage includes kidney disease, CVA and heart failure. Identifiable causes of hypertension include chronic renal disease.   Kathryn Cook is here by herself today as she was the last several office visits. She brings in her folder with her bp readings to me during her visit. She does have dementia and so the reliability of HPI is questionable. Her BP's averages  160s/110s.  She was initially on Norvasc, HCTZ, and lisinopril. She didn't tolerate the ACEI due to a cough. I stopped the ACEI, saw her back in follow up. She was noted to not have a cough anymore but BP's were elevated. Due to DM, she was started on an ARB during last visit 2 weeks ago and told to follow up with BP log. She is heard from the lounge, with a dry hacky cough.  She says this started a week and a half ago and not too long after seeing me. When asked about her medicines, she says she doesn't take any pills. She only has an inhaler. I called the [facility] to verify and spoke to [employer] who verified the medicines the patient was on; which is what I have in her medical chart. I did also ask if there was any way, staff could come back to the room with her during her office visits, as she has dementia and the encounter isn't beneficial because the information I'm getting is incorrect.   Review of Systems  Constitutional: Negative for fever, chills, weight loss and malaise/fatigue.  HENT: Negative for ear pain, postnasal drip, rhinorrhea and sore throat.   Eyes: Negative for pain and visual disturbance.  Respiratory: Positive for cough and chest tightness. Negative for hemoptysis, shortness of breath and wheezing.   Cardiovascular: Negative for chest pain, palpitations and orthopnea.  Gastrointestinal: Negative for heartburn, nausea, diarrhea and constipation.  Endocrine: Negative for cold intolerance and heat intolerance.  Genitourinary: Negative for dysuria.  Musculoskeletal: Negative for back pain and myalgias.  Skin: Negative for rash.  Neurological: Negative for dizziness, numbness and headaches.  Psychiatric/Behavioral: Positive for confusion and agitation. Negative for sleep disturbance and decreased concentration. The patient is not nervous/anxious.        Objective:   Physical Exam  Nursing note and  vitals reviewed. Constitutional: She appears well-developed and  well-nourished.  HENT:  Head: Normocephalic and atraumatic.  Right Ear: External ear normal.  Left Ear: External ear normal.  Nose: Nose normal.  Mouth/Throat: Oropharynx is clear and moist.  Cardiovascular: Normal rate, regular rhythm and normal heart sounds.   Pulmonary/Chest: Effort normal and breath sounds normal. No respiratory distress. She has no wheezes. She exhibits no tenderness.  Abdominal: Soft. Bowel sounds are normal.  Neurological: She is alert. She has normal reflexes.  Not oriented to person or time. Unable to recall people's names or their relationships to her. Doesn't recognize phone numbers.   Skin: Skin is warm and dry.  Psychiatric: She has a normal mood and affect.  Poor insight on medications and medical history.        Assessment:     Kathryn Cook was seen today for follow-up and cough.  Diagnoses and associated orders for this visit:  HTN (hypertension), malignant  Cough  Dementia  Tobacco abuse  H/O: CVA (cerebrovascular accident)  Kidney disease, chronic, stage III (GFR 30-59 ml/min) - Basic metabolic panel       Plan:     Will get BMP. Have stopped Diovan due to the cough. Also would like to get CT scan of abdomen to rule out Renal artery stenosis as her bp's are elevated more so on the ACEI and ARB per readings.  Have verified with [facility] staff and they will start coming into the room with her.  Have added ARB to her allergy list as well until we get a scan and maybe look at her chest as well as she is a smoker. She doesn't have hemoptysis but have to rule out lung masses.   With her dementia and HPI difficult to obtain during the visit, will need more documentation from [facility] as to [facility] and medications they give her and what times. SBP's are elevated despite being on Norvasc 10 mg, HCTZ 25 mg daily, and ARB 80mg  daily. Her cough returned and didn't return until after starting the ARB.      WIll get results of BMP and if creatinine is ok, would like to  get CT scan but may have to get ultrasound instead due to CKD-3 and in order to avoid contrast.

## 2013-10-31 ENCOUNTER — Ambulatory Visit (HOSPITAL_COMMUNITY)
Admission: RE | Admit: 2013-10-31 | Discharge: 2013-10-31 | Disposition: A | Discharge status: Home / Self Care | Payer: Medicaid Other | Source: Ambulatory Visit | Attending: Emergency Medicine | Admitting: Emergency Medicine

## 2013-10-31 ENCOUNTER — Other Ambulatory Visit (HOSPITAL_COMMUNITY): Payer: Self-pay | Admitting: Family Medicine

## 2013-10-31 DIAGNOSIS — I10 Essential (primary) hypertension: Secondary | ICD-10-CM

## 2013-10-31 DIAGNOSIS — N183 Chronic kidney disease, stage 3 unspecified: Secondary | ICD-10-CM | POA: Insufficient documentation

## 2013-10-31 DIAGNOSIS — I6529 Occlusion and stenosis of unspecified carotid artery: Secondary | ICD-10-CM

## 2013-10-31 DIAGNOSIS — I129 Hypertensive chronic kidney disease with stage 1 through stage 4 chronic kidney disease, or unspecified chronic kidney disease: Secondary | ICD-10-CM | POA: Insufficient documentation

## 2013-10-31 NOTE — Progress Notes (Addendum)
Bilateral renal artery duplex completed.  No evidence of significant renal artery stenosis.  Abnormal intrarenal resistive indices noted.

## 2013-11-05 ENCOUNTER — Telehealth: Payer: Self-pay | Admitting: *Deleted

## 2013-11-05 NOTE — Telephone Encounter (Signed)
Kathryn Cook was notified and appreciative.

## 2013-11-05 NOTE — Telephone Encounter (Signed)
Tanya from assisted living called and request results from [place]. Will route to MD.

## 2013-11-05 NOTE — Telephone Encounter (Signed)
I just reviewed. The u/s was negative for any renal artery stenosis.

## 2013-11-15 ENCOUNTER — Other Ambulatory Visit (HOSPITAL_COMMUNITY): Payer: Self-pay | Admitting: Family Medicine

## 2013-11-15 ENCOUNTER — Other Ambulatory Visit: Payer: Self-pay | Admitting: Nurse Practitioner

## 2013-12-06 ENCOUNTER — Ambulatory Visit (INDEPENDENT_AMBULATORY_CARE_PROVIDER_SITE_OTHER): Payer: Medicaid Other | Admitting: Nurse Practitioner

## 2013-12-06 ENCOUNTER — Encounter: Payer: Self-pay | Admitting: Nurse Practitioner

## 2013-12-06 ENCOUNTER — Encounter (INDEPENDENT_AMBULATORY_CARE_PROVIDER_SITE_OTHER): Payer: Self-pay

## 2013-12-06 VITALS — BP 130/90 | HR 72 | Ht 64.0 in | Wt 217.0 lb

## 2013-12-06 DIAGNOSIS — R413 Other amnesia: Secondary | ICD-10-CM

## 2013-12-06 DIAGNOSIS — F3289 Other specified depressive episodes: Secondary | ICD-10-CM

## 2013-12-06 DIAGNOSIS — F03B0 Unspecified dementia, moderate, without behavioral disturbance, psychotic disturbance, mood disturbance, and anxiety: Secondary | ICD-10-CM

## 2013-12-06 DIAGNOSIS — F0390 Unspecified dementia without behavioral disturbance: Secondary | ICD-10-CM

## 2013-12-06 DIAGNOSIS — F329 Major depressive disorder, single episode, unspecified: Secondary | ICD-10-CM

## 2013-12-06 MED ORDER — DONEPEZIL HCL 5 MG PO TABS
5.0000 mg | ORAL_TABLET | Freq: Every day | ORAL | Status: DC
Start: 2013-12-06 — End: 2014-06-10

## 2013-12-06 MED ORDER — ESCITALOPRAM OXALATE 10 MG PO TABS: ORAL_TABLET | ORAL | Status: DC

## 2013-12-06 MED ORDER — ASPIRIN EC 325 MG PO TBEC: DELAYED_RELEASE_TABLET | ORAL | Status: DC

## 2013-12-06 MED ORDER — DONEPEZIL HCL 10 MG PO TABS: 10.0000 mg | ORAL_TABLET | Freq: Every day | ORAL | Status: DC

## 2013-12-06 NOTE — Patient Instructions (Signed)
PLAN:  Continue aspirin 325 mg orally every day for secondary stroke prevention and maintain strict control of hypertension with blood pressure goal below 130/90, diabetes with hemoglobin A1c goal below 6.5% and lipids with LDL cholesterol goal below 100 mg/dL.  Continue Lexapro 10 mg daily for depression.  Start Donepezil 5 mg daily for memory loss.  If tolerated, start Donepezil 10 mg in 1 month. Followup in the future with me in 6 months.

## 2013-12-06 NOTE — Progress Notes (Signed)
PATIENT: Kathryn Cook DOB: 03-Aug-1951  REASON FOR VISIT: follow up HISTORY FROM: patient  HISTORY OF PRESENT ILLNESS: Kathryn Cook is an 63 y.o. female who was recently diagnosed hypertension and diabetes mellitus, who saw a physician for the first time few weeks ago, prolonged history of smoking, alcohol and recreational drug abuse including crack cocaine, quit all substance abuse and smoking about 3 months ago, also carries a diagnosis of early dementia (question if this is vascular dementia secondary to multiple occult strokes).  Family noticed upon her awakening at 1100 on 06/10/2013 that she had right sided weakness, slurred speech and facial droop. En route CBG 107. BP 210/122. NIHSS=8. Patient was not a TPA candidate secondary to delay in arrival. MRI of the brain 06/10/2013 showed acute non hemorrhagic punctate infarcts of the left thalamus, internal capsule, and insular cortex. Acute non hemorrhagic punctate infarcts of the right internal capsule. Atrophy and extensive white matter disease is advanced for age. Remote lacunar infarcts of the thalami and basal ganglia bilaterally. MRA of the brain showed moderate small vessel disease. Moderate stenosis of the right P1 segment. Atherosclerotic changes within the cavernous carotid arteries bilaterally as well as the right vertebral artery. 2D Echocardiogram showed an EF 55-60% with no source of embolus. Carotid Doppler with no evidence of hemodynamically significant internal carotid artery stenosis. Patient had right hemiparesis, and slurred speech which is resolved. Family states that her short term memory was really affected. She is living at [facility], a 24 hour care home.  Update 12/06/13 (LL):  Patient returns for follow up.  Since last visit, she states she has been doing well, she is accompanied only by a group home worker that doesn't know her well.  Her mood seems to be improved.  Her blood pressure log shows that her BP  has been mostly at goal, she is now on 2 additional BP meds.  She is tolerating aspirin well without any significant bruising.     REVIEW OF SYSTEMS: Full 14 system review of systems performed and notable only for:  cough, leg swelling, snoring   ALLERGIES: Allergies  Allergen Reactions  . Ace Inhibitors Cough  . Diovan [Valsartan] Cough    HOME MEDICATIONS: Outpatient Prescriptions Prior to Visit  Medication Sig Dispense Refill  . amLODipine (NORVASC) 10 MG tablet TAKE ONE TABLET BY MOUTH ONCE DAILY.  30 tablet  1  . Blood Glucose Monitoring Suppl (BLOOD GLUCOSE MONITOR SYSTEM) W/DEVICE KIT Check sugars as directed  1 each  0  . carvedilol (COREG) 3.125 MG tablet TAKE 1 TABLET BY MOUTH TWICE DAILY WITH MEALS.  60 tablet  1  . hydrochlorothiazide (HYDRODIURIL) 25 MG tablet TAKE ONE TABLET BY MOUTH ONCE DAILY.  30 tablet  1  . metFORMIN (GLUCOPHAGE) 500 MG tablet TAKE (1) TABLET BY MOUTH TWICE A DAY WITH MEALS (BREAKFAST AND SUPPER).  60 tablet  1  . nicotine (NICODERM CQ - DOSED IN MG/24 HOURS) 21 mg/24hr patch Apply to skin daily for 6 weeks. Remove every night and place a new one on in the morning.  28 patch  1  . simvastatin (ZOCOR) 20 MG tablet TAKE 1 TABLET BY MOUTH ONCE DAILY AT 6:00 PM.  30 tablet  1  . tiotropium (SPIRIVA HANDIHALER) 18 MCG inhalation capsule Place 1 capsule (18 mcg total) into inhaler and inhale daily.  30 capsule  12  . aspirin EC 325 MG tablet TAKE ONE TABLET BY MOUTH ONCE DAILY.  30 tablet  1  . escitalopram (LEXAPRO) 10 MG tablet TAKE 1 TABLET BY MOUTH ONCE DAILY AFTER BREAKFAST.  30 tablet  0   No facility-administered medications prior to visit.     PHYSICAL EXAM  Filed Vitals:   12/06/13 1355  BP: 130/90  Pulse: 72  Height: 5' 4"  (1.626 m)  Weight: 217 lb (98.431 kg)   Body mass index is 37.23 kg/(m^2).  Generalized: Well developed, in no acute distress, Pleasant african [place] female.  Head: normocephalic and atraumatic. Oropharynx benign    Neck: Supple, no carotid bruits  Cardiac: Regular rate rhythm, no murmur  Musculoskeletal: No deformity   Neurological examination  Mentation: Alert, diminished attention, registration and recall. MMSE 19/30 with Poor short term memory recall 0/3. Decrease animal naming, score 5 (normal 12+). Clock Drawing 0/4. Geriatric Depression Score 6 (improved, last time 10). Cranial nerve II-XII: Pupils were equal round reactive to light extraocular movements were full, visual field were full on confrontational test. Mild right lower face weakness. Tongue midline. No drift, mild right grip weakness. hearing was intact to finger rubbing bilaterally. Uvula tongue midline. Head turning and shoulder shrug and were normal and symmetric.  Motor: normal bulk and tone, full strength in the BUE, BLE, fine finger movements normal, no pronator drift. No focal weakness  Sensory: normal and symmetric to light touch, pinprick, and vibration  Coordination: finger-nose-finger, heel-to-shin bilaterally, no dysmetria  Reflexes: Deep tendon reflexes in the upper and lower extremities are present and symmetric.  Gait and Station: Rising up from seated position without assistance, normal stance, without trunk ataxia, moderate stride, good arm swing, smooth turning, able to perform tiptoe, and heel walking without difficulty.   DIAGNOSTIC DATA (LABS, IMAGING, TESTING) - I reviewed patient records, labs, notes, testing and imaging myself where available.  Lab Results  Component Value Date   HGBA1C 6.4* 10/01/2013   Lab Results  Component Value Date   [id number] 597 05/31/2013   Lab Results  Component Value Date   TSH 2.108 05/31/2013    ASSESSMENT AND PLAN Kathryn Cook is a 63 y.o. female presenting with right sided weakness on 06/10/13. Imaging confirms left thalamus, internal capsule, and insular cortex infarcts as well as right internal capsule infarcts. Infarcts felt to be thrombotic secondary to small vessel  disease given location and uncontrolled risk factors. Patient with resultant cognitive deficits, MMSE 19/30; unchanged after treating depression.   PLAN:  Continue aspirin 325 mg orally every day for secondary stroke prevention and maintain strict control of hypertension with blood pressure goal below 130/90, diabetes with hemoglobin A1c goal below 6.5% and lipids with LDL cholesterol goal below 100 mg/dL.  Continue Lexapro 10 mg daily for depression.  Start Donepezil 5 mg daily for memory loss.  If tolerated, start Donepezil 10 mg in 1 month. Followup in the future with me in 6 months.  Meds ordered this encounter  Medications  . aspirin EC 325 MG tablet    Sig: TAKE ONE TABLET BY MOUTH ONCE DAILY.    Dispense:  30 tablet    Refill:  6    Order Specific Question:  Supervising Provider    Answer:  Garvin Fila [[id number]]  . escitalopram (LEXAPRO) 10 MG tablet    Sig: TAKE 1 TABLET BY MOUTH ONCE DAILY AFTER BREAKFAST.    Dispense:  30 tablet    Refill:  6    Order Specific Question:  Supervising Provider    Answer:  Garvin Fila [[id number]]  .  donepezil (ARICEPT) 5 MG tablet    Sig: Take 1 tablet (5 mg total) by mouth at bedtime.    Dispense:  30 tablet    Refill:  0    Order Specific Question:  Supervising Provider    Answer:  Leonie Man, PRAMOD S [[id number]]  . donepezil (ARICEPT) 10 MG tablet    Sig: Take 1 tablet (10 mg total) by mouth at bedtime.    Dispense:  30 tablet    Refill:  6    Order Specific Question:  Supervising Provider    Answer:  Zada Finders Aarionna Germer, MSN, NP-C 12/06/2013, 7:55 PM [facility] [address] [place], [place] [address] [phone]  Note: This document was prepared with digital dictation and possible smart phrase technology. Any transcriptional errors that result from this process are unintentional.

## 2013-12-13 ENCOUNTER — Other Ambulatory Visit (HOSPITAL_COMMUNITY): Payer: Self-pay | Admitting: Family Medicine

## 2014-01-03 ENCOUNTER — Other Ambulatory Visit: Payer: Self-pay | Admitting: Family Medicine

## 2014-01-11 ENCOUNTER — Other Ambulatory Visit: Payer: Self-pay | Admitting: Nurse Practitioner

## 2014-01-11 ENCOUNTER — Other Ambulatory Visit: Payer: Self-pay | Admitting: Family Medicine

## 2014-02-25 ENCOUNTER — Telehealth: Payer: Self-pay | Admitting: Nurse Practitioner

## 2014-02-25 NOTE — Telephone Encounter (Signed)
Kathryn Cook, pt's caregiver at [place] her per Jeani Hawking, NP that pt needs to be checked by pt's PCP for UTI or other infection first and PCP can refer pt to Psychiatry to help with pt's behaviors. I advised the caregiver that if the pt has any other problems, questions or concerns to call the office. Caregiver verbalized understanding.

## 2014-02-25 NOTE — Telephone Encounter (Signed)
Tania with [facility] calling regarding patient--patient has an appointment on 06-10-14 but needs to be seen sooner--patient is incontinent real bad, does not want to take showers, verbally abusive-and confused a lot--please call because Tania is really concerned about patient--thank you.

## 2014-02-25 NOTE — Telephone Encounter (Signed)
She needs to be checked by her Primary Physician for UTI or other infection first.  She may need the PCP to refer to Psychiatry to help with her behaviors.

## 2014-02-25 NOTE — Telephone Encounter (Signed)
Tania, pts' caregiver from [facility] calling stating that pt is having a lot of incontinence, mood swings and just acting violently and refusing to take showers. Pt next OV is 06/10/14, caregiver wanted to know if pt needed to come in sooner. Please advise

## 2014-04-15 ENCOUNTER — Telehealth: Payer: Self-pay | Admitting: Nurse Practitioner

## 2014-04-15 NOTE — Telephone Encounter (Signed)
Tanya [place] with [facility] [place] @ [phone], requesting an written order sent to [facility] at Rx script for patient to receive meds.  Please call and advise.

## 2014-04-16 NOTE — Telephone Encounter (Signed)
Please advise 

## 2014-04-16 NOTE — Telephone Encounter (Signed)
I called back to get contact info for [facility].  Was told he works at [employer] [phone], fax [phone].  I called [facility].  Spoke with Miranda.  She transferred me to [facility] was not available).  Says the patient is requesting new Rx's for all PCP meds.  Advised we are not actually her PCP, and we prescribed Aricept, Lexapro and Aspirin.  Says the patient still has refils on meds we prescribed, and does not need those at this time.  They will contact the facility and ask that the patient follow up with PCP regarding other meds.

## 2014-06-10 ENCOUNTER — Encounter (INDEPENDENT_AMBULATORY_CARE_PROVIDER_SITE_OTHER): Payer: Self-pay

## 2014-06-10 ENCOUNTER — Encounter: Payer: Self-pay | Admitting: Nurse Practitioner

## 2014-06-10 ENCOUNTER — Ambulatory Visit (INDEPENDENT_AMBULATORY_CARE_PROVIDER_SITE_OTHER): Payer: Medicaid Other | Admitting: Nurse Practitioner

## 2014-06-10 VITALS — BP 133/80 | HR 58 | Ht 64.5 in | Wt 214.0 lb

## 2014-06-10 DIAGNOSIS — F17200 Nicotine dependence, unspecified, uncomplicated: Secondary | ICD-10-CM

## 2014-06-10 DIAGNOSIS — I6350 Cerebral infarction due to unspecified occlusion or stenosis of unspecified cerebral artery: Secondary | ICD-10-CM

## 2014-06-10 DIAGNOSIS — F03B0 Unspecified dementia, moderate, without behavioral disturbance, psychotic disturbance, mood disturbance, and anxiety: Secondary | ICD-10-CM

## 2014-06-10 DIAGNOSIS — F0390 Unspecified dementia without behavioral disturbance: Secondary | ICD-10-CM

## 2014-06-10 DIAGNOSIS — I639 Cerebral infarction, unspecified: Secondary | ICD-10-CM

## 2014-06-10 DIAGNOSIS — Z720 Tobacco use: Secondary | ICD-10-CM

## 2014-06-10 DIAGNOSIS — F3289 Other specified depressive episodes: Secondary | ICD-10-CM

## 2014-06-10 DIAGNOSIS — F32A Depression, unspecified: Secondary | ICD-10-CM

## 2014-06-10 DIAGNOSIS — F329 Major depressive disorder, single episode, unspecified: Secondary | ICD-10-CM

## 2014-06-10 MED ORDER — ASPIRIN EC 325 MG PO TBEC: DELAYED_RELEASE_TABLET | ORAL | Status: DC

## 2014-06-10 MED ORDER — ESCITALOPRAM OXALATE 10 MG PO TABS: ORAL_TABLET | ORAL | Status: DC

## 2014-06-10 MED ORDER — DONEPEZIL HCL 10 MG PO TABS: 10.0000 mg | ORAL_TABLET | Freq: Every day | ORAL | Status: DC

## 2014-06-10 NOTE — Patient Instructions (Signed)
Continue aspirin 325 mg orally every day for secondary stroke prevention and maintain strict control of hypertension with blood pressure goal below 140/90, diabetes with hemoglobin A1c goal below 6.5% and lipids with LDL cholesterol goal below 100 mg/dL.  Continue Lexapro 10 mg daily for depression.  Continue Donepezil 10 mg daily for memory loss. Followup in 6 months, sooner as needed.

## 2014-06-10 NOTE — Progress Notes (Signed)
PATIENT: Kathryn Cook DOB: [birth year]  REASON FOR VISIT: routine follow up for stroke and dementia  HISTORY FROM: patient, caregiver  HISTORY OF PRESENT ILLNESS: Kathryn Cook is an 63 y.o. female who was recently diagnosed hypertension and diabetes mellitus, who saw a physician for the first time few weeks ago, prolonged history of smoking, alcohol and recreational drug abuse including crack cocaine, quit all substance abuse and smoking about 3 months ago, also carries a diagnosis of early dementia (question if this is vascular dementia secondary to multiple occult strokes).   Family noticed upon her awakening at 1100 on 06/10/2013 that she had right sided weakness, slurred speech and facial droop. En route CBG 107. BP 210/122. NIHSS=8. Patient was not a TPA candidate secondary to delay in arrival. MRI of the brain 06/10/2013 showed acute non hemorrhagic punctate infarcts of the left thalamus, internal capsule, and insular cortex. Acute non hemorrhagic punctate infarcts of the right internal capsule. Atrophy and extensive white matter disease is advanced for age. Remote lacunar infarcts of the thalami and basal ganglia bilaterally. MRA of the brain showed moderate small vessel disease. Moderate stenosis of the right P1 segment. Atherosclerotic changes within the cavernous carotid arteries bilaterally as well as the right vertebral artery. 2D Echocardiogram showed an EF 55-60% with no source of embolus. Carotid Doppler with no evidence of hemodynamically significant internal carotid artery stenosis. Patient had right hemiparesis, and slurred speech which is resolved. Family states that her short term memory was really affected. She is living at [facility], a 24 hour care home.   Update 12/06/13 (LL): Patient returns for follow up. Since last visit, she states she has been doing well, she is accompanied only by a group home worker that doesn't know her well. Her mood seems to be  improved. Her blood pressure log shows that her BP has been mostly at goal, she is now on 2 additional BP meds. She is tolerating aspirin well without any significant bruising.   Update 06/10/14 (LL): Since last visit, patient states that she has been doing well. No illness or hospitalizations. Blood pressure is well controlled, it is 133/80 in the office today.  She is tolerating aspirin well with no signs of significant bleeding or bruising. She continues to due well on Lexapro and she still smokes part of a cigarette once in awhile. She has not smoked in the last 2 weeks.  She is tolerating donepizil well without known side effects. MMSE stable since last visit, 19/30.  REVIEW OF SYSTEMS: Full 14 system review of systems performed and notable only for: bladder incontinence  ALLERGIES: Allergies  Allergen Reactions  . Ace Inhibitors Cough  . Diovan [Valsartan] Cough    HOME MEDICATIONS: Outpatient Prescriptions Prior to Visit  Medication Sig Dispense Refill  . amLODipine (NORVASC) 10 MG tablet TAKE ONE TABLET BY MOUTH ONCE DAILY.  30 tablet  1  . Blood Glucose Monitoring Suppl (BLOOD GLUCOSE MONITOR SYSTEM) W/DEVICE KIT Check sugars as directed  1 each  0  . carvedilol (COREG) 3.125 MG tablet TAKE 1 TABLET BY MOUTH TWICE DAILY WITH MEALS.  60 tablet  1  . hydrochlorothiazide (HYDRODIURIL) 25 MG tablet TAKE ONE TABLET BY MOUTH ONCE DAILY.  30 tablet  1  . metFORMIN (GLUCOPHAGE) 500 MG tablet TAKE (1) TABLET BY MOUTH TWICE A DAY WITH MEALS (BREAKFAST AND SUPPER).  60 tablet  3  . simvastatin (ZOCOR) 20 MG tablet TAKE 1 TABLET BY MOUTH ONCE DAILY AT 6:00  PM.  30 tablet  1  . tiotropium (SPIRIVA HANDIHALER) 18 MCG inhalation capsule Place 1 capsule (18 mcg total) into inhaler and inhale daily.  30 capsule  12  . aspirin EC 325 MG tablet TAKE ONE TABLET BY MOUTH ONCE DAILY.  30 tablet  6  . donepezil (ARICEPT) 10 MG tablet Take 1 tablet (10 mg total) by mouth at bedtime.  30 tablet  6  .  donepezil (ARICEPT) 5 MG tablet Take 1 tablet (5 mg total) by mouth at bedtime.  30 tablet  0  . escitalopram (LEXAPRO) 10 MG tablet TAKE 1 TABLET BY MOUTH ONCE DAILY AFTER BREAKFAST.  30 tablet  6  . nicotine (NICODERM CQ - DOSED IN MG/24 HOURS) 21 mg/24hr patch Apply to skin daily for 6 weeks. Remove every night and place a new one on in the morning.  28 patch  1  . valsartan (DIOVAN) 80 MG tablet TAKE ONE TABLET BY MOUTH ONCE DAILY.  30 tablet  3   No facility-administered medications prior to visit.    PHYSICAL EXAM Filed Vitals:   06/10/14 1432  BP: 133/80  Pulse: 58  Height: 5' 4.5" (1.638 m)  Weight: 214 lb (97.07 kg)   Body mass index is 36.18 kg/(m^2). No exam data present No flowsheet data found.  MMSE - Mini Mental State Exam 06/10/2014  Orientation to time 1  Orientation to Place 4  Registration 3  Attention/ Calculation 2  Recall 0  Language- name 2 objects 2  Language- repeat 1  Language- follow 3 step command 3  Language- read & follow direction 1  Write a sentence 1  Copy design 1  Total score 19    Generalized: Well developed, in no acute distress, Pleasant african [place] female.  Head: normocephalic and atraumatic. Oropharynx benign  Neck: Supple, no carotid bruits  Cardiac: Regular rate rhythm, no murmur  Musculoskeletal: No deformity   Neurological examination  Mentation: Alert, diminished attention, registration and recall. MMSE 19/30 with Poor short term memory recall 0/3. Decrease animal naming, score 5 (normal 12+). Clock Drawing 1/4. Geriatric Depression Score 7 (improved, last time 10).  Cranial nerve II-XII: Pupils were equal round reactive to light extraocular movements were full, visual field were full on confrontational test. Mild right lower face weakness. Tongue midline. No drift, mild right grip weakness. hearing was intact to finger rubbing bilaterally. Uvula tongue midline. Head turning and shoulder shrug and were normal and symmetric.    Motor: normal bulk and tone, full strength in the BUE, BLE, fine finger movements normal, no pronator drift. No focal weakness  Sensory: normal and symmetric to light touch, pinprick, and vibration  Coordination: finger-nose-finger, heel-to-shin bilaterally, no dysmetria  Reflexes: Deep tendon reflexes in the upper and lower extremities are present and symmetric.  Gait and Station: Rising up from seated position without assistance, normal stance, without trunk ataxia, moderate stride, good arm swing, smooth turning, able to perform tiptoe, and heel walking without difficulty.    ASSESSMENT: Ms. Kathryn Cook is a 63 y.o. female presenting with right sided weakness on 06/10/13. Imaging confirms left thalamus, internal capsule, and insular cortex infarcts as well as right internal capsule infarcts. Infarcts felt to be thrombotic secondary to small vessel disease given location and uncontrolled risk factors. Patient with resultant cognitive deficits, MMSE 19/30; unchanged after treating depression.   PLAN:  Continue aspirin 325 mg orally every day for secondary stroke prevention and maintain strict control of hypertension with blood pressure goal  below 140/90, diabetes with hemoglobin A1c goal below 6.5% and lipids with LDL cholesterol goal below 100 mg/dL.  Continue Lexapro 10 mg daily for depression.  Continue Donepezil 10 mg daily for memory loss. Followup in 6 months, sooner as needed.  Meds ordered this encounter  Medications  . escitalopram (LEXAPRO) 10 MG tablet    Sig: TAKE 1 TABLET BY MOUTH ONCE DAILY AFTER BREAKFAST.    Dispense:  30 tablet    Refill:  6    Order Specific Question:  Supervising Provider    Answer:  Andrey Spearman R [[id number]]  . aspirin EC 325 MG tablet    Sig: TAKE ONE TABLET BY MOUTH ONCE DAILY.    Dispense:  30 tablet    Refill:  6    Order Specific Question:  Supervising Provider    Answer:  Andrey Spearman R [[id number]]  . donepezil (ARICEPT) 10 MG tablet     Sig: Take 1 tablet (10 mg total) by mouth at bedtime.    Dispense:  30 tablet    Refill:  6    Order Specific Question:  Supervising Provider    Answer:  Andrey Spearman R [[id number]]   Rudi Rummage Rylie Limburg, MSN, FNP-BC, A/GNP-C 06/10/2014, 4:27 PM [facility] [address] [place], [place] [address] [phone]  Note: This document was prepared with digital dictation and possible smart phrase technology. Any transcriptional errors that result from this process are unintentional.

## 2014-06-14 NOTE — Progress Notes (Signed)
I agree with the above plan 

## 2014-06-24 ENCOUNTER — Telehealth: Payer: Self-pay | Admitting: Family Medicine

## 2014-06-27 NOTE — Telephone Encounter (Signed)
Spoke with lenore and she will call back to discuss patients

## 2014-07-15 ENCOUNTER — Telehealth: Payer: Self-pay | Admitting: Family Medicine

## 2014-07-15 NOTE — Telephone Encounter (Signed)
APPT MADE

## 2014-07-29 ENCOUNTER — Encounter: Payer: Self-pay | Admitting: Nurse Practitioner

## 2014-08-15 ENCOUNTER — Encounter: Payer: Self-pay | Admitting: Neurology

## 2014-08-23 IMAGING — CT CT HEAD W/O CM
2 series · 15 of 30 positions shown, 19 images · non-contrast
Comparison: 05/10/2013.

CLINICAL DATA: 62-year-old female code stroke. Right side weakness
and slurred speech. Last seen normal 1577 hrs.

EXAM:
CT HEAD WITHOUT CONTRAST
TECHNIQUE: Contiguous axial images were obtained from the base of the skull
through the vertex without intravenous contrast.

[Series 2: head w/o · axial · non-contrast · 0.49mm/px · z∈[+108,+238]mm · 13 of 32 slices shown, 17 images]
[im 3/32  brain]
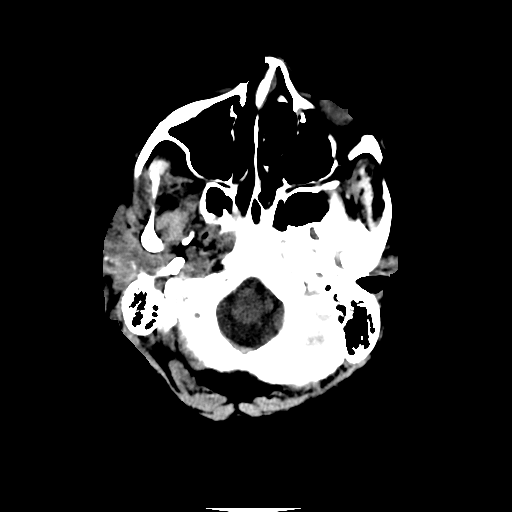
[im 3/32  bone]
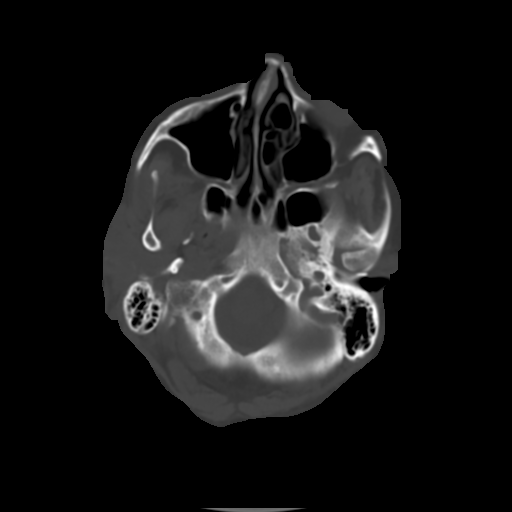
[im 5/32  brain]
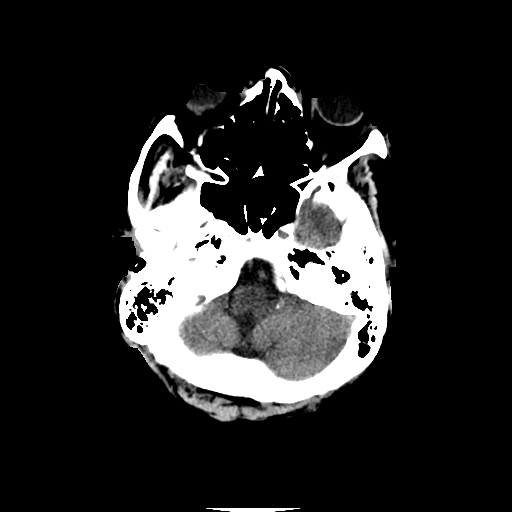
[im 7/32  brain]
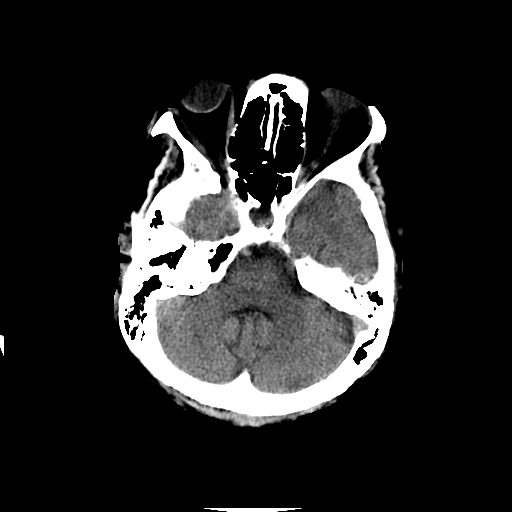
[im 9/32  brain]
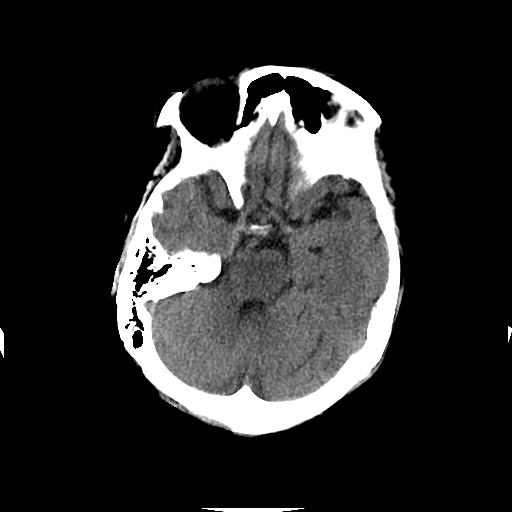
[im 12/32  brain]
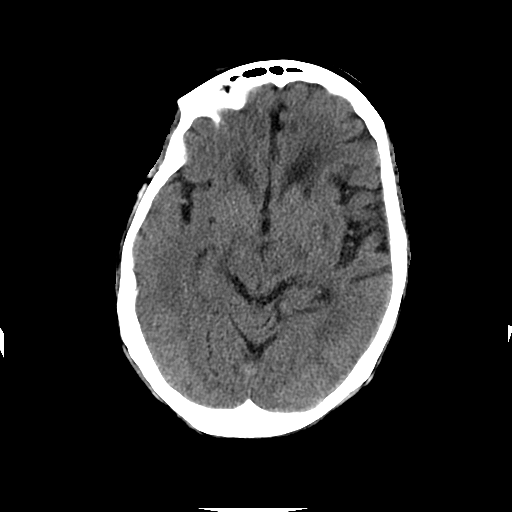
[im 12/32  bone]
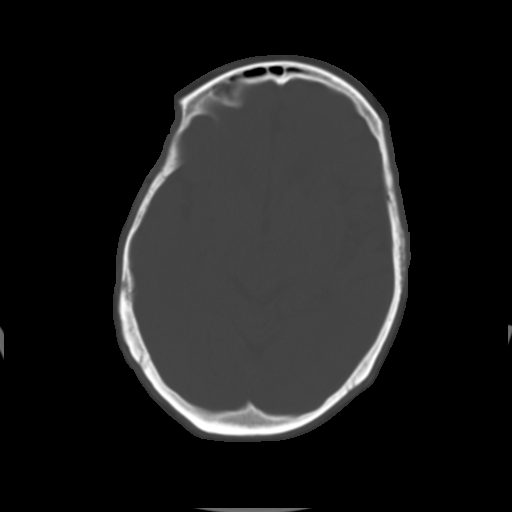
[im 14/32  brain]
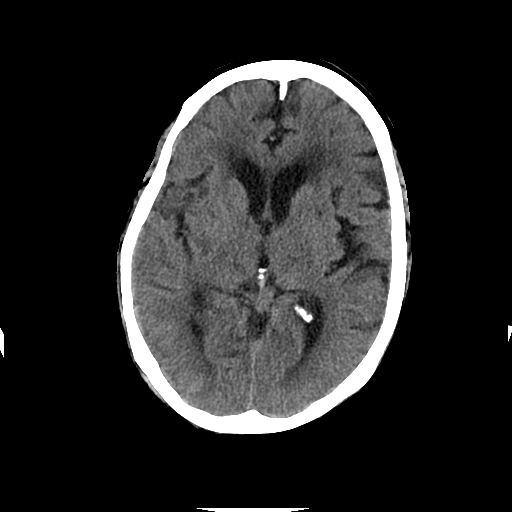
[im 16/32  brain]
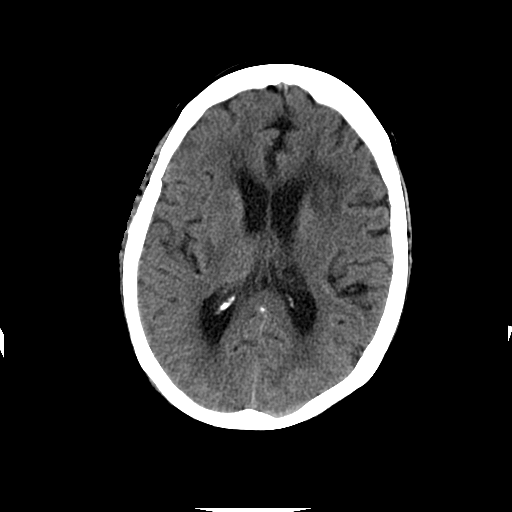
[im 18/32  brain]
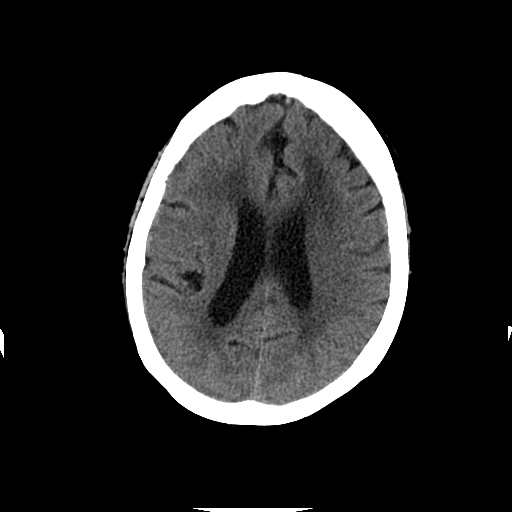
[im 20/32  brain]
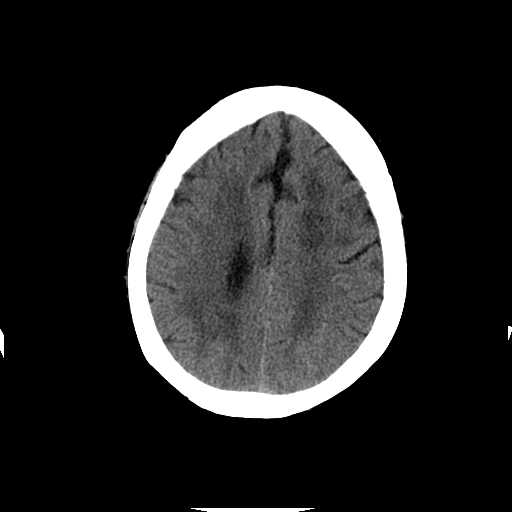
[im 20/32  bone]
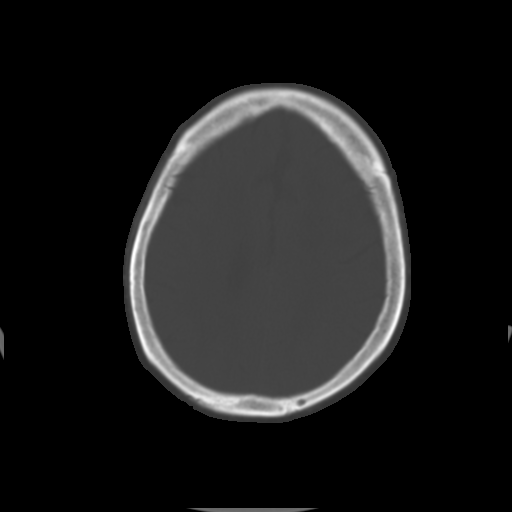
[im 23/32  brain]
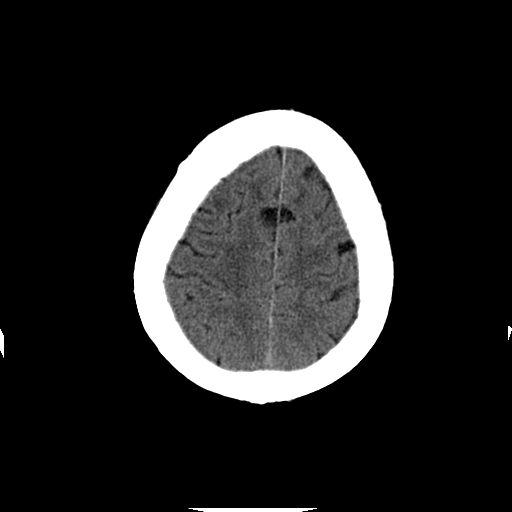
[im 25/32  brain]
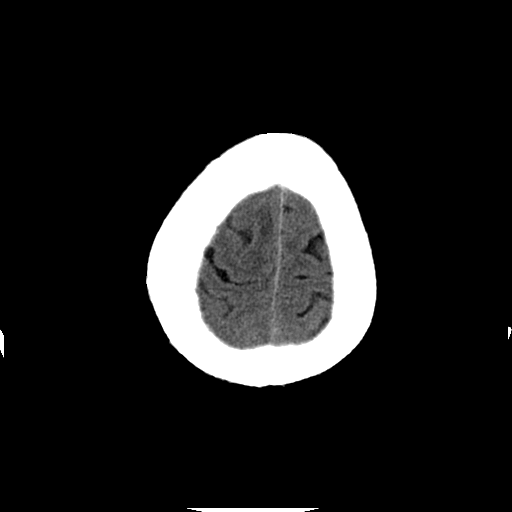
[im 27/32  brain]
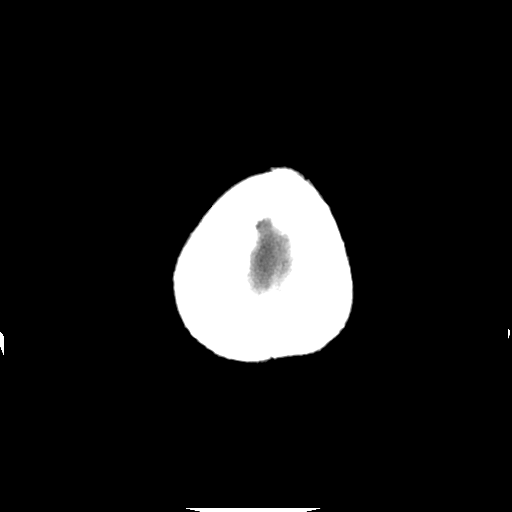
[im 29/32  brain]
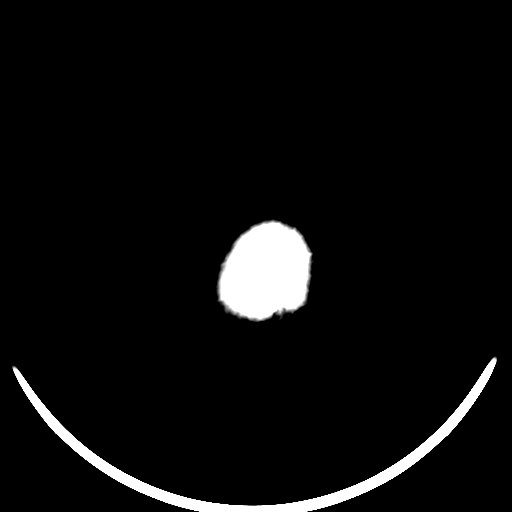
[im 29/32  bone]
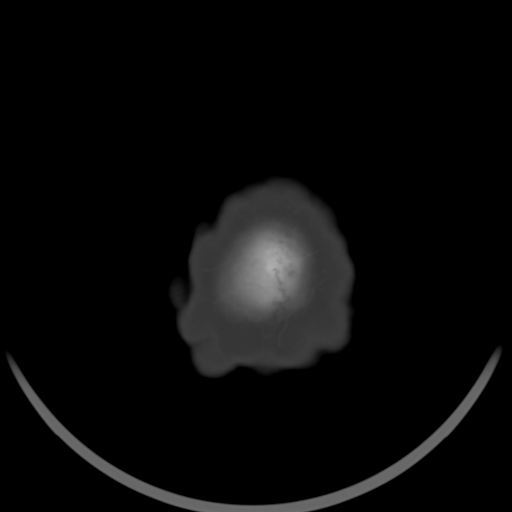

[Series 3: head w/o bone · axial · non-contrast · 0.49mm/px · z∈[+108,+128]mm · 2 of 32 slices shown]
[im 3/32  bone]
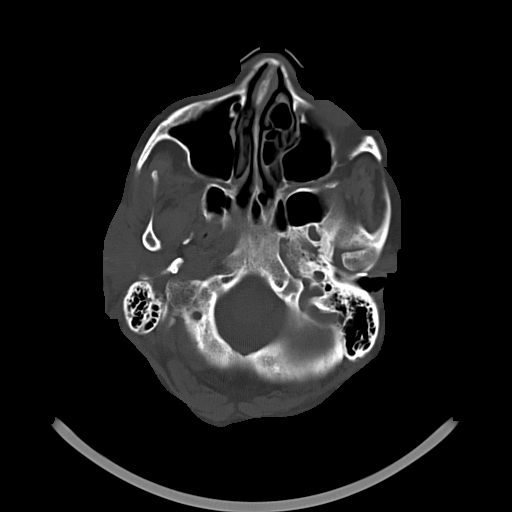
[im 7/32  bone]
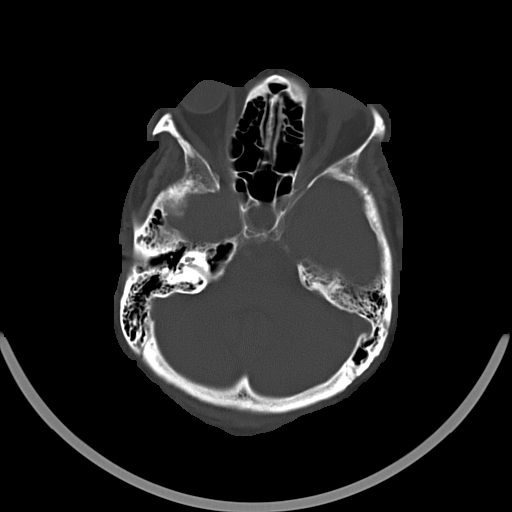

[15 of 30 positions shown; findings below may reference images not displayed]

FINDINGS: Visualized paranasal sinuses and mastoids are clear. No acute
osseous abnormality identified. Stable orbits and scalp soft
tissues.

Chronic full amic and other deep gray matter nuclei lacunar infarcts
re- identified. Patchy cerebral white matter hypodensity greater in
the left hemisphere is stable. There are new small areas of cortical
hypodensity in the left MCA territory without mass effect or
hemorrhage (series 2, images 22, 20, 16). No other cortically based
infarct identified. No suspicious intracranial vascular
hyperdensity. No midline shift, mass effect, or evidence of
intracranial mass lesion. No ventriculomegaly.
IMPRESSION: 1. Scattered small acute to subacute cortically based infarcts in
the left MCA territory. No mass effect or hemorrhage.

2. Underlying chronic small vessel ischemia.

Study discussed by telephone with Dr. Dino Senada on 06/10/2013 at [DATE] .

## 2014-08-23 IMAGING — CR DG CHEST 2V
2 series · 2 of 2 positions shown · non-contrast
Comparison: 05/10/2013

CLINICAL DATA: Stroke

EXAM:
CHEST  2 VIEW

[w chest pa]
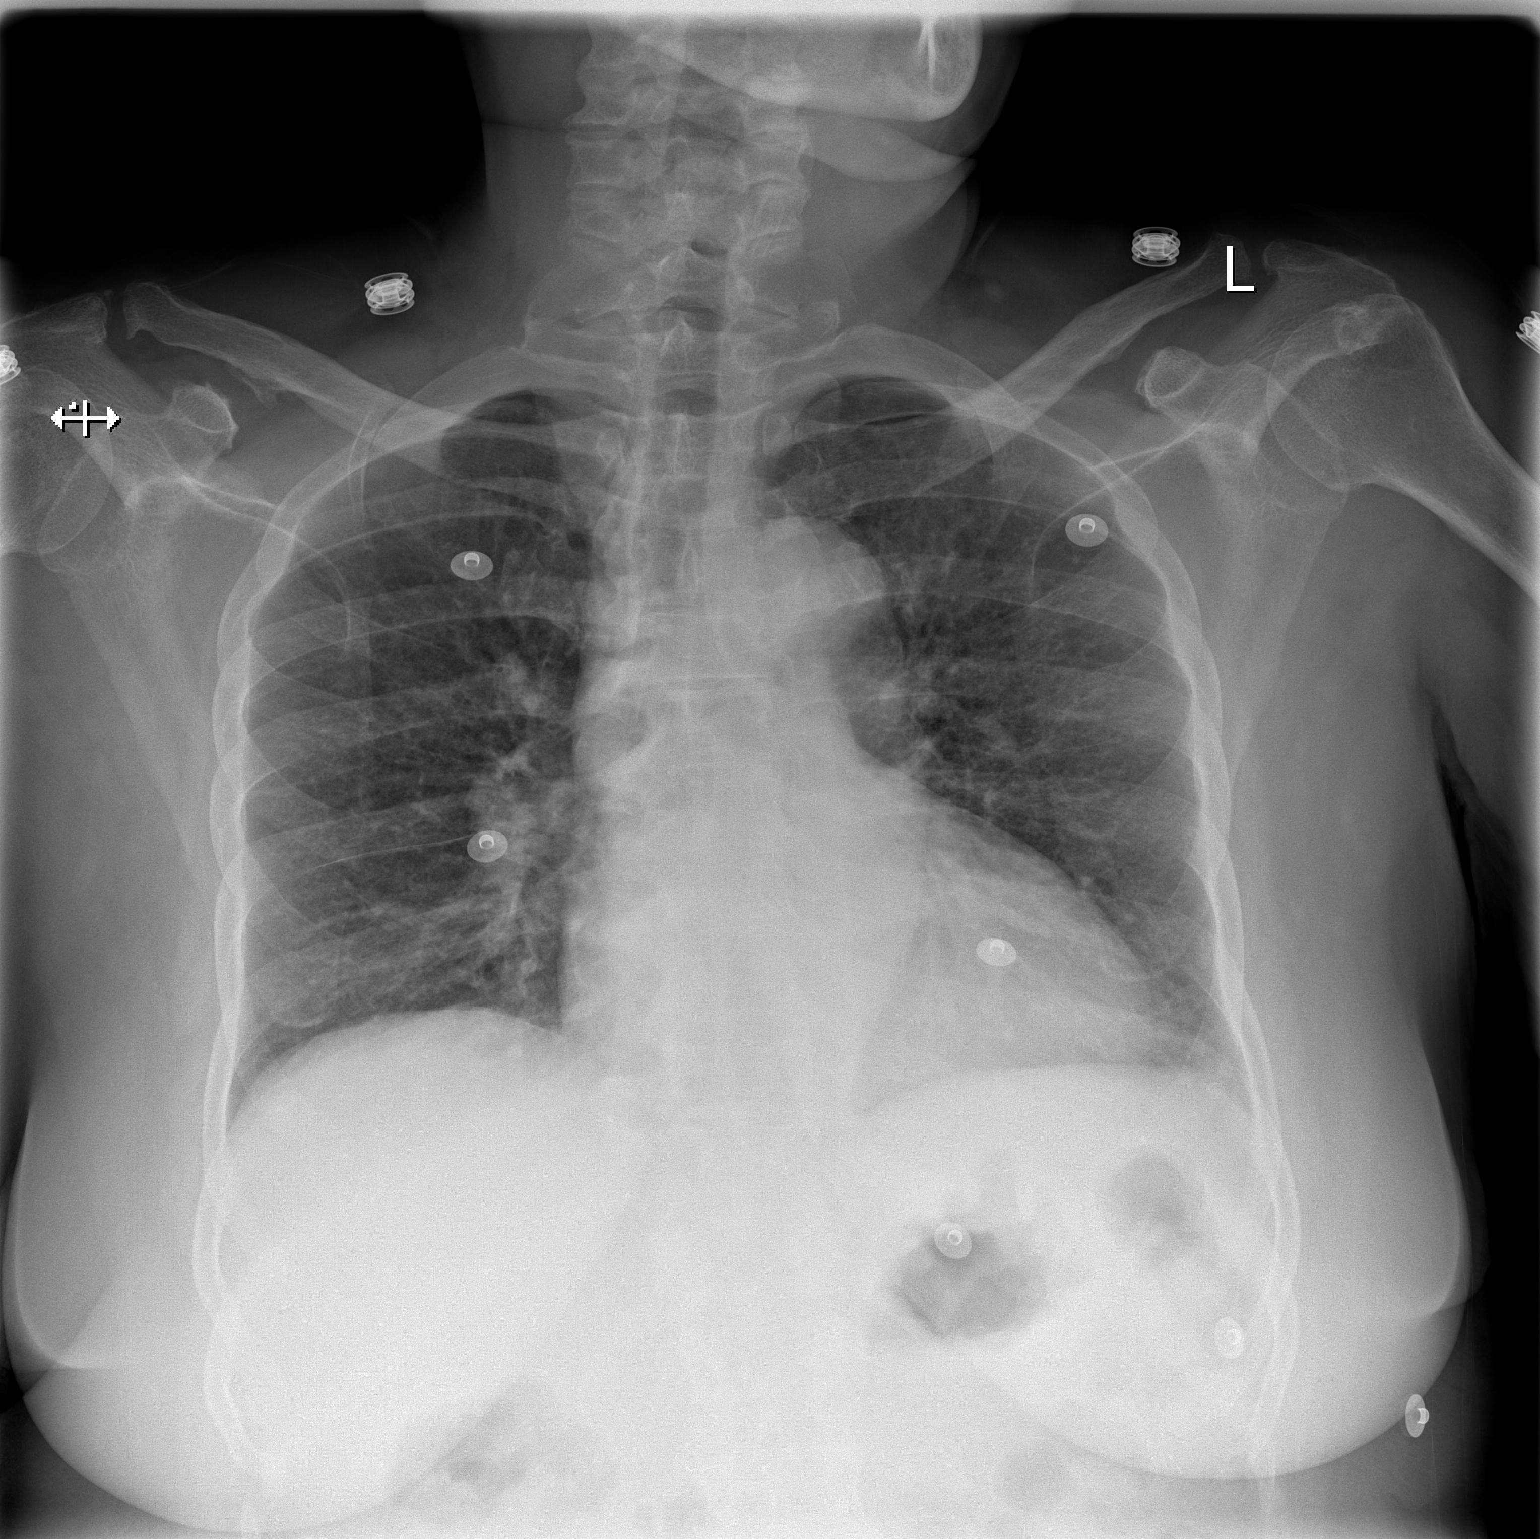

[w chest lat]
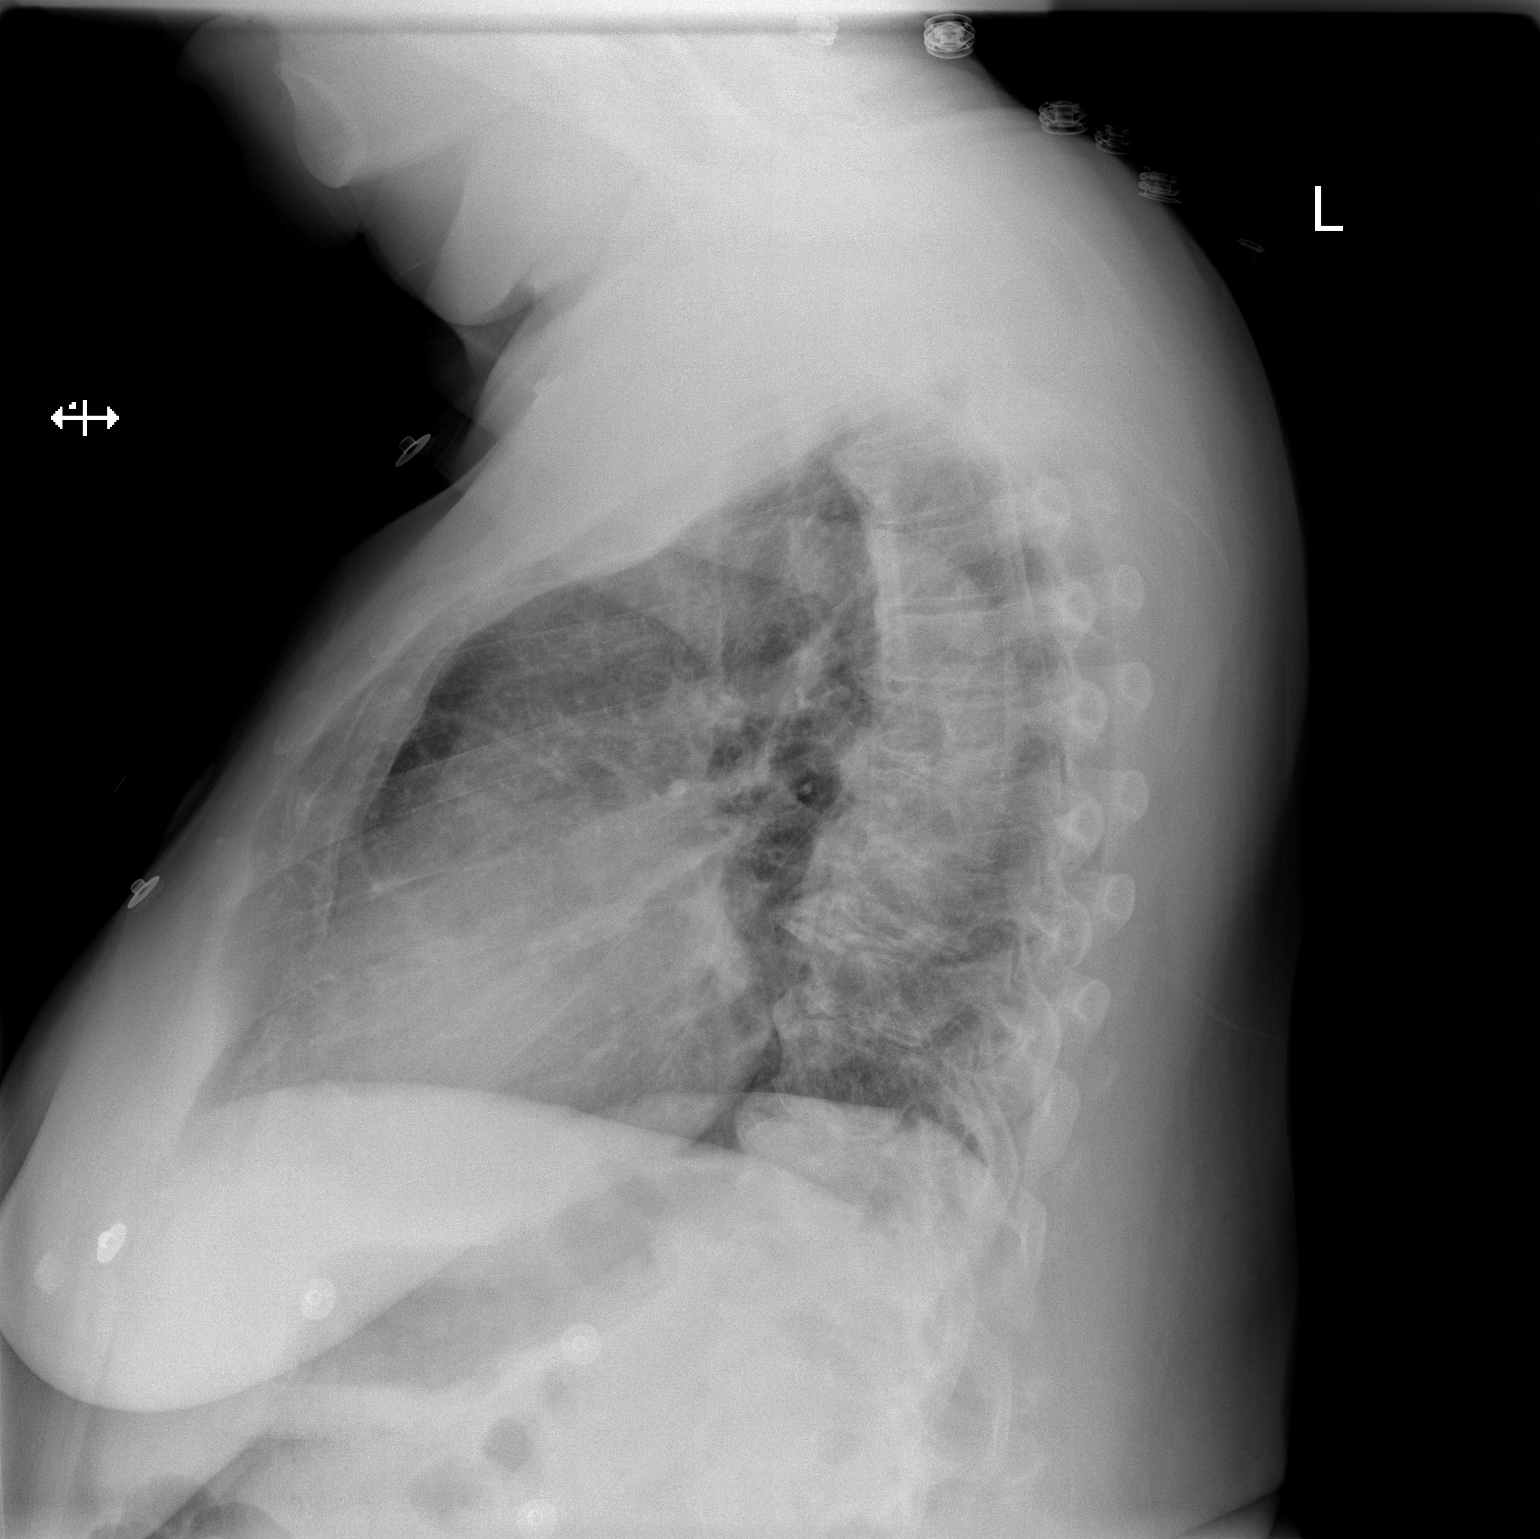

[2 of 2 positions shown; findings below may reference images not displayed]

FINDINGS: The heart size and mediastinal contours are within normal limits.
Both lungs are clear. The visualized skeletal structures are
unremarkable.
IMPRESSION: No active cardiopulmonary disease.

## 2014-09-25 ENCOUNTER — Ambulatory Visit (INDEPENDENT_AMBULATORY_CARE_PROVIDER_SITE_OTHER): Payer: Medicaid Other | Admitting: Family Medicine

## 2014-09-25 ENCOUNTER — Ambulatory Visit: Payer: Medicaid Other | Admitting: Family Medicine

## 2014-09-25 ENCOUNTER — Encounter: Payer: Self-pay | Admitting: Family Medicine

## 2014-09-25 VITALS — BP 143/85 | HR 71 | Temp 97.7°F | Wt 218.8 lb

## 2014-09-25 DIAGNOSIS — E119 Type 2 diabetes mellitus without complications: Secondary | ICD-10-CM

## 2014-09-25 DIAGNOSIS — I10 Essential (primary) hypertension: Secondary | ICD-10-CM

## 2014-09-25 LAB — POCT HEMOGLOBIN: HEMOGLOBIN: 11.3 g/dL — AB (ref 12.2–16.2)

## 2014-09-25 MED ORDER — CARVEDILOL 6.25 MG PO TABS: 6.2500 mg | ORAL_TABLET | Freq: Two times a day (BID) | ORAL | Status: DC

## 2014-09-25 NOTE — Progress Notes (Signed)
   Subjective:    Patient ID: Kathryn Cook, female    DOB: October 30, 1950, 63 y.o.   MRN: [id number]  HPI 63 year old female who is a new patient to the practice she was formally seen by urology practice after stroke and then more recently by the health department in [place]. She lives in a group home with or other adults. She has meals prepared there they administer her medicine and monitor her blood sugar and blood pressure. She has no complaints she does have some memory issues as outlined below and takes Aricept.  Review of her blood sugars shows fair control but we will know more when A1c is available. Likewise, blood pressures are fairly well controlled but could be better. She is intolerant to ACEs and arbs.    Review of Systems  Constitutional: Negative.   HENT: Negative.   Eyes: Negative.   Respiratory: Negative.   Cardiovascular: Negative.   Gastrointestinal: Negative.   Endocrine: Negative.   Genitourinary: Negative.   Hematological: Negative.   Psychiatric/Behavioral: Negative.        Objective:   Physical Exam  Constitutional: She is oriented to person, place, and time. She appears well-developed and well-nourished.  Eyes: Conjunctivae and EOM are normal.  Neck: Normal range of motion. Neck supple.  Cardiovascular: Normal rate, regular rhythm and normal heart sounds.   Pulmonary/Chest: Effort normal and breath sounds normal.  Abdominal: Soft. Bowel sounds are normal.  Musculoskeletal: Normal range of motion.  Neurological: She is alert and oriented to person, place, and time. She has normal reflexes.  Status exam included 3 item recall at 5 minutes which she was unable to do  Skin: Skin is warm and dry.  Psychiatric: She has a normal mood and affect. Her behavior is normal. Thought content normal.   BP 143/85 mmHg  Pulse 71  Temp(Src) 97.7 F (36.5 C) (Oral)  Wt 218 lb 12.8 oz (99.247 kg)       Assessment & Plan:  1. Essential hypertension She  currently takes amlodipine and carvedilol for blood pressure control. Will increase carvedilol to 6.25 mg twice a day and continue to monitor - POCT hemoglobin - CMP14+EGFR - Lipid panel  2. Type 2 diabetes mellitus without complication Wardell Honour MD - POCT hemoglobin - CMP14+EGFR - Lipid panel

## 2014-09-26 LAB — POCT GLYCOSYLATED HEMOGLOBIN (HGB A1C): Hemoglobin A1C: 6.3

## 2014-09-26 LAB — CMP14+EGFR
A/G RATIO: 1.6 (ref 1.1–2.5)
ALK PHOS: 67 IU/L (ref 39–117)
ALT: 25 IU/L (ref 0–32)
AST: 25 IU/L (ref 0–40)
Albumin: 4.4 g/dL (ref 3.6–4.8)
BILIRUBIN TOTAL: 0.3 mg/dL (ref 0.0–1.2)
BUN / CREAT RATIO: 25 (ref 11–26)
BUN: 27 mg/dL (ref 8–27)
CO2: 24 mmol/L (ref 18–29)
Calcium: 9.8 mg/dL (ref 8.7–10.3)
Chloride: 101 mmol/L (ref 97–108)
Creatinine, Ser: 1.06 mg/dL — ABNORMAL HIGH (ref 0.57–1.00)
GFR calc non Af Amer: 56 mL/min/{1.73_m2} — ABNORMAL LOW (ref >59)
GFR, EST AFRICAN AMERICAN: 65 mL/min/{1.73_m2} (ref >59)
GLOBULIN, TOTAL: 2.8 g/dL (ref 1.5–4.5)
Glucose: 86 mg/dL (ref 65–99)
POTASSIUM: 4.3 mmol/L (ref 3.5–5.2)
SODIUM: 143 mmol/L (ref 134–144)
Total Protein: 7.2 g/dL (ref 6.0–8.5)

## 2014-09-26 LAB — LIPID PANEL
CHOL/HDL RATIO: 1.8 ratio (ref 0.0–4.4)
Cholesterol, Total: 193 mg/dL (ref 100–199)
HDL: 105 mg/dL (ref >39)
LDL Calculated: 76 mg/dL (ref 0–99)
Triglycerides: 60 mg/dL (ref 0–149)
VLDL Cholesterol Cal: 12 mg/dL (ref 5–40)

## 2014-09-26 NOTE — Addendum Note (Signed)
Addended by: Earlene Plater on: 09/26/2014 12:06 PM   Modules accepted: Orders

## 2014-09-30 ENCOUNTER — Other Ambulatory Visit: Payer: Self-pay | Admitting: *Deleted

## 2014-09-30 ENCOUNTER — Telehealth: Payer: Self-pay | Admitting: Family Medicine

## 2014-09-30 DIAGNOSIS — J449 Chronic obstructive pulmonary disease, unspecified: Secondary | ICD-10-CM

## 2014-09-30 MED ORDER — TIOTROPIUM BROMIDE MONOHYDRATE 18 MCG IN CAPS: 18.0000 ug | ORAL_CAPSULE | Freq: Every day | RESPIRATORY_TRACT | Status: DC

## 2014-09-30 NOTE — Telephone Encounter (Signed)
Was seen by provider so refill on Spiriva given.

## 2014-09-30 NOTE — Telephone Encounter (Signed)
Kathryn Cook [name]- needs to speak to nurse about meds. For [place].  Call Kathryn Cook at [phone]

## 2014-09-30 NOTE — Telephone Encounter (Signed)
-----   Message from Wardell Honour, MD sent at 09/26/2014 12:20 PM EST ----- 11 A1c is at goal. I would not recommend any change in diabetes treatment

## 2014-10-15 ENCOUNTER — Encounter: Payer: Self-pay | Admitting: *Deleted

## 2014-10-17 NOTE — Telephone Encounter (Signed)
RX done. 

## 2014-10-28 ENCOUNTER — Telehealth: Payer: Self-pay | Admitting: Family Medicine

## 2014-10-28 NOTE — Telephone Encounter (Signed)
Pt urine has a bit of a strong odor, no dysuria, no low grade fevers, no other symptoms of UTI, will continue to monitor and push the fluids. If pt develops any of the before mentioned symptoms will CB to re-evaluate.

## 2014-12-09 ENCOUNTER — Ambulatory Visit: Payer: Medicaid Other | Admitting: Nurse Practitioner

## 2014-12-11 ENCOUNTER — Ambulatory Visit (INDEPENDENT_AMBULATORY_CARE_PROVIDER_SITE_OTHER): Payer: Medicaid Other | Admitting: Nurse Practitioner

## 2014-12-11 ENCOUNTER — Encounter: Payer: Self-pay | Admitting: Nurse Practitioner

## 2014-12-11 VITALS — BP 149/85 | HR 62 | Ht 64.5 in | Wt 225.4 lb

## 2014-12-11 DIAGNOSIS — F0390 Unspecified dementia without behavioral disturbance: Secondary | ICD-10-CM | POA: Diagnosis not present

## 2014-12-11 DIAGNOSIS — F17200 Nicotine dependence, unspecified, uncomplicated: Secondary | ICD-10-CM

## 2014-12-11 DIAGNOSIS — F0150 Vascular dementia without behavioral disturbance: Secondary | ICD-10-CM | POA: Diagnosis not present

## 2014-12-11 DIAGNOSIS — Z720 Tobacco use: Secondary | ICD-10-CM

## 2014-12-11 DIAGNOSIS — I639 Cerebral infarction, unspecified: Secondary | ICD-10-CM | POA: Insufficient documentation

## 2014-12-11 DIAGNOSIS — F03B0 Unspecified dementia, moderate, without behavioral disturbance, psychotic disturbance, mood disturbance, and anxiety: Secondary | ICD-10-CM

## 2014-12-11 NOTE — Progress Notes (Signed)
I agree with the above plan 

## 2014-12-11 NOTE — Patient Instructions (Signed)
Continues Aricept 10 mg daily for memory loss Continue aspirin 325 daily for secondary stroke prevention Maintaining strict control of blood pressure with goal below 130/90 Control of diabetes with hemoglobin A1c below 6.5% Control of cholesterol with goal below 200 and LDL below 70 Continue Lexapro 10 mg daily for depression Recommend 30 minutes of walking daily or some type of exercise Follow-up in 6 months

## 2014-12-11 NOTE — Progress Notes (Signed)
[facility]  PATIENT: Kathryn Cook DOB: 12/12/1950   REASON FOR VISIT: Follow-up for history of stroke and dementia HISTORY FROM: Patient and caregiver    HISTORY OF PRESENT ILLNESS:Kathryn Cook is an 64 y.o. female who was recently diagnosed hypertension and diabetes mellitus, who saw a physician for the first time few weeks ago, prolonged history of smoking, alcohol and recreational drug abuse including crack cocaine, quit all substance abuse and smoking about 3 months ago, also carries a diagnosis of early dementia (question if this is vascular dementia secondary to multiple occult strokes).  Family noticed upon her awakening at 1100 on 06/10/2013 that she had right sided weakness, slurred speech and facial droop. En route CBG 107. BP 210/122. NIHSS=8. Patient was not a TPA candidate secondary to delay in arrival. MRI of the brain 06/10/2013 showed acute non hemorrhagic punctate infarcts of the left thalamus, internal capsule, and insular cortex. Acute non hemorrhagic punctate infarcts of the right internal capsule. Atrophy and extensive white matter disease is advanced for age. Remote lacunar infarcts of the thalami and basal ganglia bilaterally. MRA of the brain showed moderate small vessel disease. Moderate stenosis of the right P1 segment. Atherosclerotic changes within the cavernous carotid arteries bilaterally as well as the right vertebral artery. 2D Echocardiogram showed an EF 55-60% with no source of embolus. Carotid Doppler with no evidence of hemodynamically significant internal carotid artery stenosis. Patient had right hemiparesis, and slurred speech which is resolved. Family states that her short term memory was really affected. She is living at [facility], a 24 hour care home.   Update 12/06/13 (LL): Patient returns for follow up. Since last visit, she states she has been doing well, she is accompanied only by a group home worker that doesn't know  her well. Her mood seems to be improved. Her blood pressure log shows that her BP has been mostly at goal, she is now on 2 additional BP meds. She is tolerating aspirin well without any significant bruising.   Update 12/10/13 [name], 64 year old female returns for follow-up accompanied by a group home representative. Since last visit, patient states that she has been doing well. No illness or hospitalizations. Blood pressure is mildly elevated 149/85 in the office today. She is tolerating aspirin well with no signs of significant bleeding or bruising. She continues to due well on Lexapro and she still smokes part of a cigarette once in awhile. She has not smoked in the last month She is tolerating donepizil well without known side effects. MMSE stable since last visit, 20/30. She gets no exercise.   REVIEW OF SYSTEMS: Full 14 system review of systems performed and notable only for those listed, all others are neg:  Constitutional: neg  Cardiovascular: neg Ear/Nose/Throat: neg  Skin: neg Eyes: neg Respiratory: neg Gastroitestinal: Urinary frequency  Hematology/Lymphatic: neg  Endocrine: neg Musculoskeletal:neg Allergy/Immunology: neg Neurological: Memory loss  Psychiatric: neg Sleep : neg   ALLERGIES: Allergies  Allergen Reactions  . Ace Inhibitors Cough  . Diovan [Valsartan] Cough    HOME MEDICATIONS: Outpatient Prescriptions Prior to Visit  Medication Sig Dispense Refill  . amLODipine (NORVASC) 10 MG tablet TAKE ONE TABLET BY MOUTH ONCE DAILY. 30 tablet 1  . aspirin EC 325 MG tablet TAKE ONE TABLET BY MOUTH ONCE DAILY. 30 tablet 6  . Blood Glucose Monitoring Suppl (BLOOD GLUCOSE MONITOR SYSTEM) W/DEVICE KIT Check sugars as directed 1 each 0  . carvedilol (COREG) 6.25 MG tablet Take 1 tablet (6.25  mg total) by mouth 2 (two) times daily with a meal. 60 tablet 3  . donepezil (ARICEPT) 10 MG tablet Take 1 tablet (10 mg total) by mouth at bedtime. 30 tablet 6  . escitalopram  (LEXAPRO) 10 MG tablet TAKE 1 TABLET BY MOUTH ONCE DAILY AFTER BREAKFAST. 30 tablet 6  . hydrochlorothiazide (HYDRODIURIL) 25 MG tablet TAKE ONE TABLET BY MOUTH ONCE DAILY. 30 tablet 1  . metFORMIN (GLUCOPHAGE) 500 MG tablet TAKE (1) TABLET BY MOUTH TWICE A DAY WITH MEALS (BREAKFAST AND SUPPER). 60 tablet 3  . simvastatin (ZOCOR) 20 MG tablet TAKE 1 TABLET BY MOUTH ONCE DAILY AT 6:00 PM. 30 tablet 1  . tiotropium (SPIRIVA HANDIHALER) 18 MCG inhalation capsule Place 1 capsule (18 mcg total) into inhaler and inhale daily. 30 capsule 12   No facility-administered medications prior to visit.    PAST MEDICAL HISTORY: Past Medical History  Diagnosis Date  . Hypertension   . Bronchitis   . Diabetes mellitus without complication   . Substance abuse     alcohol, but patient states she doesn't drink that much anymore  . Hyperlipidemia   . CVA (cerebral infarction)   . Memory loss     PAST SURGICAL HISTORY: History reviewed. No pertinent past surgical history.  FAMILY HISTORY: Family History  Problem Relation Age of Onset  . Other Mother     unknown  . Other Father     unknown    SOCIAL HISTORY: History   Social History  . Marital Status: Single    Spouse Name: N/A  . Number of Children: 1  . Years of Education: 11   Occupational History  . unemployed    Social History Main Topics  . Smoking status: Former Smoker -- 0.25 packs/day for 45 years    Types: Cigarettes    Quit date: 09/27/2013  . Smokeless tobacco: Never Used  . Alcohol Use: No     Comment: occasional  . Drug Use: No     Comment: last used 3 months ago  . Sexual Activity: Yes   Other Topics Concern  . Not on file   Social History Narrative   Patient is single and lives at [facility].   Patient has one child.   Patient is right-handed.   Patient drinks two cups of coffee daily.   Patient has an 11 th grade education.     PHYSICAL EXAM  Filed Vitals:   12/11/14 1011  BP:  149/85  Pulse: 62  Height: 5' 4.5" (1.638 m)  Weight: 225 lb 6.4 oz (102.241 kg)   Body mass index is 38.11 kg/(m^2). Generalized: Well developed, in no acute distress, Pleasant african [place] female who is obese.  Head: normocephalic and atraumatic. Oropharynx benign  Neck: Supple, no carotid bruits  Cardiac: Regular rate rhythm, no murmur  Musculoskeletal: No deformity   Neurological examination  Mentation: Alert, diminished attention, registration and recall. MMSE 20/30 missing items in orientation, calculation and  short term memory recall 0/3. AFT 9, Clock drawing 1/4.   Cranial nerve II-XII: Pupils were equal round reactive to light extraocular movements were full, visual field were full on confrontational test. Mild right lower face weakness. Tongue midline. No drift, mild right grip weakness. hearing was intact to finger rubbing bilaterally. Uvula tongue midline. Head turning and shoulder shrug and were normal and symmetric.  Motor: normal bulk and tone, full strength in the BUE, BLE, fine finger movements normal, no pronator drift. No focal weakness  Sensory: normal and symmetric to light touch, pinprick, and vibration  Coordination: finger-nose-finger, heel-to-shin bilaterally, no dysmetria  Reflexes: Deep tendon reflexes in the upper and lower extremities are present and symmetric.  Gait and Station: Rising up from seated position without assistance, normal stance, without trunk ataxia, moderate stride, good arm swing, smooth turning, able to perform tiptoe, and heel walking without difficulty. Tandem gait is unsteady no assistive device. Romberg negative     DIAGNOSTIC DATA (LABS, IMAGING, TESTING) - I reviewed patient records, labs, notes, testing and imaging myself where available.   Lab Results  Component Value Date   CHOL 193 09/25/2014   HDL 105 09/25/2014   LDLCALC 76 09/25/2014   TRIG 60 09/25/2014   CHOLHDL 1.8 09/25/2014   Lab Results  Component  Value Date   HGBA1C 6.3 09/26/2014   Lab Results  Component Value Date   HUDJSHFW26 378 05/31/2013    ASSESSMENT AND PLAN  64 y.o. year old female  has a past medical history of right-sided weakness and imaging confirmed left almost internal capsule and insular cortex infarcts as well as right internal capsule infarct. Infarcts felt to be thrombotic secondary to small vessel disease given location and risk factors of hypertension, diabetes, and hyperlipidemia  Continues Aricept 10 mg daily for memory loss Continue aspirin 325 daily for secondary stroke prevention Maintaining strict control of blood pressure with goal below 130/90 Control of diabetes with hemoglobin A1c below 6.5% Control of cholesterol with goal below 200 and LDL below 70  Reviewed most lipid profile and hemoglobin A1c  from 12 2015 Continue Lexapro 10 mg daily for depression Recommend 30 minutes of walking daily or some type of exercise Follow-up in 6 months Dennie Bible, [facility], [facility], [facility] [address] [place], [place] [address] [phone]

## 2014-12-16 ENCOUNTER — Telehealth: Payer: Self-pay | Admitting: Family Medicine

## 2014-12-16 ENCOUNTER — Other Ambulatory Visit: Payer: Self-pay | Admitting: *Deleted

## 2014-12-16 MED ORDER — AMLODIPINE BESYLATE 10 MG PO TABS: 10.0000 mg | ORAL_TABLET | Freq: Every day | ORAL | Status: DC

## 2014-12-16 MED ORDER — THERAPEUTIC MULTIVIT/MINERAL PO TABS: 1.0000 | ORAL_TABLET | Freq: Every day | ORAL | Status: DC

## 2014-12-16 MED ORDER — HYDROCHLOROTHIAZIDE 25 MG PO TABS: 25.0000 mg | ORAL_TABLET | Freq: Every day | ORAL | Status: DC

## 2014-12-18 NOTE — Telephone Encounter (Signed)
Signed by Dr Sabra Heck and faxed back.

## 2014-12-23 ENCOUNTER — Telehealth: Payer: Self-pay | Admitting: *Deleted

## 2014-12-23 DIAGNOSIS — E119 Type 2 diabetes mellitus without complications: Secondary | ICD-10-CM

## 2014-12-23 NOTE — Telephone Encounter (Signed)
[facility] needs a referral to foot dr in [place] on [address]. [facility] for a diabetic foot exam. Is this ok for me to put in referral

## 2014-12-23 NOTE — Telephone Encounter (Signed)
Okay to refer to podiatry

## 2015-01-13 ENCOUNTER — Other Ambulatory Visit: Payer: Self-pay | Admitting: *Deleted

## 2015-01-13 MED ORDER — CARVEDILOL 6.25 MG PO TABS: 6.2500 mg | ORAL_TABLET | Freq: Two times a day (BID) | ORAL | Status: DC

## 2015-01-13 MED ORDER — METFORMIN HCL 500 MG PO TABS: ORAL_TABLET | ORAL | Status: DC

## 2015-01-16 ENCOUNTER — Ambulatory Visit: Payer: Self-pay

## 2015-01-16 ENCOUNTER — Encounter: Payer: Medicaid Other | Admitting: Podiatry

## 2015-01-16 DIAGNOSIS — E119 Type 2 diabetes mellitus without complications: Secondary | ICD-10-CM

## 2015-01-16 NOTE — Progress Notes (Signed)
This encounter was created in error - please disregard.

## 2015-01-16 NOTE — Patient Instructions (Signed)
Diabetes and Foot Care Diabetes may cause you to have problems because of poor blood supply (circulation) to your feet and legs. This may cause the skin on your feet to become thinner, break easier, and heal more slowly. Your skin may become dry, and the skin may peel and crack. You may also have nerve damage in your legs and feet causing decreased feeling in them. You may not notice minor injuries to your feet that could lead to infections or more serious problems. Taking care of your feet is one of the most important things you can do for yourself.  HOME CARE INSTRUCTIONS  Wear shoes at all times, even in the house. Do not go barefoot. Bare feet are easily injured.  Check your feet daily for blisters, cuts, and redness. If you cannot see the bottom of your feet, use a mirror or ask someone for help.  Wash your feet with warm water (do not use hot water) and mild soap. Then pat your feet and the areas between your toes until they are completely dry. Do not soak your feet as this can dry your skin.  Apply a moisturizing lotion or petroleum jelly (that does not contain alcohol and is unscented) to the skin on your feet and to dry, brittle toenails. Do not apply lotion between your toes.  Trim your toenails straight across. Do not dig under them or around the cuticle. File the edges of your nails with an emery board or nail file.  Do not cut corns or calluses or try to remove them with medicine.  Wear clean socks or stockings every day. Make sure they are not too tight. Do not wear knee-high stockings since they may decrease blood flow to your legs.  Wear shoes that fit properly and have enough cushioning. To break in new shoes, wear them for just a few hours a day. This prevents you from injuring your feet. Always look in your shoes before you put them on to be sure there are no objects inside.  Do not cross your legs. This may decrease the blood flow to your feet.  If you find a minor scrape,  cut, or break in the skin on your feet, keep it and the skin around it clean and dry. These areas may be cleansed with mild soap and water. Do not cleanse the area with peroxide, alcohol, or iodine.  When you remove an adhesive bandage, be sure not to damage the skin around it.  If you have a wound, look at it several times a day to make sure it is healing.  Do not use heating pads or hot water bottles. They may burn your skin. If you have lost feeling in your feet or legs, you may not know it is happening until it is too late.  Make sure your health care provider performs a complete foot exam at least annually or more often if you have foot problems. Report any cuts, sores, or bruises to your health care provider immediately. SEEK MEDICAL CARE IF:   You have an injury that is not healing.  You have cuts or breaks in the skin.  You have an ingrown nail.  You notice redness on your legs or feet.  You feel burning or tingling in your legs or feet.  You have pain or cramps in your legs and feet.  Your legs or feet are numb.  Your feet always feel cold. SEEK IMMEDIATE MEDICAL CARE IF:   There is increasing redness,   swelling, or pain in or around a wound.  There is a red line that goes up your leg.  Pus is coming from a wound.  You develop a fever or as directed by your health care provider.  You notice a bad smell coming from an ulcer or wound. Document Released: 09/10/2000 Document Revised: 05/16/2013 Document Reviewed: 02/20/2013 [employer] Patient Information 2015 [employer]. This information is not intended to replace advice given to you by your health care provider. Make sure you discuss any questions you have with your health care provider.  

## 2015-01-29 ENCOUNTER — Ambulatory Visit: Payer: Medicaid Other | Admitting: Family Medicine

## 2015-01-31 ENCOUNTER — Ambulatory Visit: Payer: Medicaid Other | Admitting: Family Medicine

## 2015-02-06 ENCOUNTER — Other Ambulatory Visit: Payer: Self-pay | Admitting: Nurse Practitioner

## 2015-02-07 ENCOUNTER — Other Ambulatory Visit: Payer: Self-pay | Admitting: *Deleted

## 2015-02-07 MED ORDER — SIMVASTATIN 20 MG PO TABS: ORAL_TABLET | ORAL | Status: DC

## 2015-02-12 ENCOUNTER — Telehealth: Payer: Self-pay | Admitting: Family Medicine

## 2015-02-18 NOTE — Telephone Encounter (Signed)
Pt has an appt with Dr.Miller 6/2 will close encounter.

## 2015-02-27 ENCOUNTER — Encounter: Payer: Self-pay | Admitting: Family Medicine

## 2015-02-27 ENCOUNTER — Ambulatory Visit (INDEPENDENT_AMBULATORY_CARE_PROVIDER_SITE_OTHER): Payer: Medicaid Other | Admitting: Family Medicine

## 2015-02-27 VITALS — BP 142/88 | HR 66 | Temp 97.4°F | Ht 64.5 in | Wt 227.0 lb

## 2015-02-27 DIAGNOSIS — R350 Frequency of micturition: Secondary | ICD-10-CM | POA: Diagnosis not present

## 2015-02-27 DIAGNOSIS — N183 Chronic kidney disease, stage 3 unspecified: Secondary | ICD-10-CM

## 2015-02-27 DIAGNOSIS — I10 Essential (primary) hypertension: Secondary | ICD-10-CM

## 2015-02-27 DIAGNOSIS — R413 Other amnesia: Secondary | ICD-10-CM | POA: Diagnosis not present

## 2015-02-27 DIAGNOSIS — N189 Chronic kidney disease, unspecified: Secondary | ICD-10-CM | POA: Diagnosis not present

## 2015-02-27 DIAGNOSIS — E1122 Type 2 diabetes mellitus with diabetic chronic kidney disease: Secondary | ICD-10-CM | POA: Diagnosis not present

## 2015-02-27 LAB — POCT UA - MICROALBUMIN: Microalbumin Ur, POC: 100 mg/L

## 2015-02-27 LAB — POCT URINALYSIS DIPSTICK
Bilirubin, UA: NEGATIVE
GLUCOSE UA: NEGATIVE
Ketones, UA: NEGATIVE
Nitrite, UA: NEGATIVE
Spec Grav, UA: 1.02
Urobilinogen, UA: NEGATIVE
pH, UA: 6

## 2015-02-27 LAB — POCT UA - MICROSCOPIC ONLY
CASTS, UR, LPF, POC: NEGATIVE
Crystals, Ur, HPF, POC: NEGATIVE
Mucus, UA: NEGATIVE
Yeast, UA: NEGATIVE

## 2015-02-27 LAB — POCT GLYCOSYLATED HEMOGLOBIN (HGB A1C): Hemoglobin A1C: 6.3

## 2015-02-27 MED ORDER — NITROFURANTOIN MONOHYD MACRO 100 MG PO CAPS: 100.0000 mg | ORAL_CAPSULE | Freq: Two times a day (BID) | ORAL | Status: DC

## 2015-02-27 NOTE — Progress Notes (Signed)
Subjective:    Patient ID: Kathryn Cook, female    DOB: 01-Sep-1951, 64 y.o.   MRN: [id number]  HPI 64 year old female here to follow-up diabetes hypertension and hyperlipidemia. She lives in a group home and brings with her some records sugars have generally been under 150 fasting but blood pressures have tended to be high as I review recordings from the last several months. She has no complaints today. There is no pain. She does however have what is called a strong odor to her urine and we are in process of checking that  Patient Active Problem List   Diagnosis Date Noted  . CVA (cerebral vascular accident) 12/11/2014  . Moderate dementia without behavioral disturbance 12/06/2013  . Dementia 10/18/2013  . Tobacco abuse 10/18/2013  . HTN (hypertension), malignant 10/18/2013  . Diabetes mellitus 10/01/2013  . Cough 08/27/2013  . Tobacco use disorder 08/27/2013  . H/O: CVA (cerebrovascular accident) 08/27/2013  . Vascular dementia 08/27/2013  . Diastolic congestive heart failure, NYHA class 1 06/13/2013  . LVH (left ventricular hypertrophy) due to hypertensive disease 06/13/2013  . CVA (cerebral infarction) 06/10/2013  . History of alcohol use 05/31/2013  . HTN (hypertension) 05/31/2013  . Diabetes 05/31/2013  . Memory loss 05/31/2013  . Kidney disease, chronic, stage III (GFR 30-59 ml/min) 05/31/2013   Outpatient Encounter Prescriptions as of 02/27/2015  Medication Sig  . amLODipine (NORVASC) 10 MG tablet Take 1 tablet (10 mg total) by mouth daily.  Marland Kitchen aspirin EC 325 MG tablet TAKE ONE TABLET BY MOUTH ONCE DAILY.  Marland Kitchen Blood Glucose Monitoring Suppl (BLOOD GLUCOSE MONITOR SYSTEM) W/DEVICE KIT Check sugars as directed  . carvedilol (COREG) 6.25 MG tablet Take 1 tablet (6.25 mg total) by mouth 2 (two) times daily with a meal.  . donepezil (ARICEPT) 10 MG tablet TAKE (1) TABLET BY MOUTH AT BEDTIME.  Marland Kitchen escitalopram (LEXAPRO) 10 MG tablet TAKE 1 TABLET BY MOUTH ONCE DAILY AFTER  BREAKFAST.  . hydrochlorothiazide (HYDRODIURIL) 25 MG tablet Take 1 tablet (25 mg total) by mouth daily.  . metFORMIN (GLUCOPHAGE) 500 MG tablet TAKE (1) TABLET BY MOUTH TWICE A DAY WITH MEALS (BREAKFAST AND SUPPER).  . simvastatin (ZOCOR) 20 MG tablet TAKE 1 TABLET BY MOUTH ONCE DAILY AT 6:00 PM.  . therapeutic multivitamin-minerals (THERAGRAN-M) tablet Take 1 tablet by mouth daily.  Marland Kitchen tiotropium (SPIRIVA HANDIHALER) 18 MCG inhalation capsule Place 1 capsule (18 mcg total) into inhaler and inhale daily.   No facility-administered encounter medications on file as of 02/27/2015.      Review of Systems  Constitutional: Negative.   HENT: Negative.   Eyes: Negative.   Respiratory: Negative.   Cardiovascular: Negative.   Gastrointestinal: Negative.   Endocrine: Negative.   Genitourinary: Negative.   Hematological: Negative.   Psychiatric/Behavioral: Negative.        Objective:   Physical Exam  Constitutional: She appears well-developed and well-nourished.  Cardiovascular: Normal rate and regular rhythm.   Pulmonary/Chest: Breath sounds normal.    BP 142/88 mmHg  Pulse 66  Temp(Src) 97.4 F (36.3 C) (Oral)  Ht 5' 4.5" (1.638 m)  Wt 227 lb (102.967 kg)  BMI 38.38 kg/m2       Assessment & Plan:  1. Memory loss This appears stable. Patient is able to communicate effectively  2. Kidney disease, chronic, stage III (GFR 30-59 ml/min) Probably related to blood pressure and diabetes. Both seem fairly well controlled  3. Essential hypertension Will recommend increase carvedilol from 6.25-12.5 mg twice a day  for better control  4. Type 2 diabetes mellitus with diabetic chronic kidney disease A1c is 6.3. Control is good - POCT glycosylated hemoglobin (Hb A1C) - POCT UA - Microalbumin - Microalbumin, urine  5. Urinary frequency Urinalysis shows some pyuria will culture and begin on Macrobid pending result of culture - POCT UA - Microscopic Only - POCT urinalysis dipstick -  Urine culture  Wardell Honour MD

## 2015-03-01 LAB — URINE CULTURE

## 2015-03-03 LAB — MICROALBUMIN, URINE: MICROALBUM., U, RANDOM: 670 ug/mL

## 2015-03-05 ENCOUNTER — Other Ambulatory Visit: Payer: Self-pay | Admitting: *Deleted

## 2015-03-05 MED ORDER — SIMVASTATIN 20 MG PO TABS: ORAL_TABLET | ORAL | Status: DC

## 2015-03-05 MED ORDER — HYDROCHLOROTHIAZIDE 25 MG PO TABS: 25.0000 mg | ORAL_TABLET | Freq: Every day | ORAL | Status: DC

## 2015-03-05 MED ORDER — METFORMIN HCL 500 MG PO TABS: ORAL_TABLET | ORAL | Status: DC

## 2015-03-05 MED ORDER — THERAPEUTIC MULTIVIT/MINERAL PO TABS: 1.0000 | ORAL_TABLET | Freq: Every day | ORAL | Status: DC

## 2015-03-06 ENCOUNTER — Other Ambulatory Visit: Payer: Self-pay | Admitting: *Deleted

## 2015-03-06 MED ORDER — AMLODIPINE BESYLATE 10 MG PO TABS: 10.0000 mg | ORAL_TABLET | Freq: Every day | ORAL | Status: DC

## 2015-04-03 ENCOUNTER — Other Ambulatory Visit: Payer: Self-pay | Admitting: Neurology

## 2015-04-04 ENCOUNTER — Other Ambulatory Visit: Payer: Self-pay

## 2015-04-04 MED ORDER — CARVEDILOL 6.25 MG PO TABS: 6.2500 mg | ORAL_TABLET | Freq: Two times a day (BID) | ORAL | Status: DC

## 2015-05-12 ENCOUNTER — Encounter: Payer: Self-pay | Admitting: *Deleted

## 2015-06-04 ENCOUNTER — Ambulatory Visit (INDEPENDENT_AMBULATORY_CARE_PROVIDER_SITE_OTHER): Payer: Medicaid Other | Admitting: Family Medicine

## 2015-06-04 ENCOUNTER — Encounter: Payer: Self-pay | Admitting: Family Medicine

## 2015-06-04 VITALS — BP 139/88 | HR 71 | Temp 98.9°F | Ht 64.5 in | Wt 232.0 lb

## 2015-06-04 DIAGNOSIS — R300 Dysuria: Secondary | ICD-10-CM

## 2015-06-04 DIAGNOSIS — I5030 Unspecified diastolic (congestive) heart failure: Secondary | ICD-10-CM | POA: Diagnosis not present

## 2015-06-04 DIAGNOSIS — R8290 Unspecified abnormal findings in urine: Secondary | ICD-10-CM

## 2015-06-04 DIAGNOSIS — N183 Chronic kidney disease, stage 3 unspecified: Secondary | ICD-10-CM

## 2015-06-04 DIAGNOSIS — I10 Essential (primary) hypertension: Secondary | ICD-10-CM | POA: Diagnosis not present

## 2015-06-04 DIAGNOSIS — F0390 Unspecified dementia without behavioral disturbance: Secondary | ICD-10-CM

## 2015-06-04 DIAGNOSIS — E119 Type 2 diabetes mellitus without complications: Secondary | ICD-10-CM | POA: Diagnosis not present

## 2015-06-04 DIAGNOSIS — F03B0 Unspecified dementia, moderate, without behavioral disturbance, psychotic disturbance, mood disturbance, and anxiety: Secondary | ICD-10-CM

## 2015-06-04 LAB — POCT URINALYSIS DIPSTICK
BILIRUBIN UA: NEGATIVE
GLUCOSE UA: NEGATIVE
Ketones, UA: NEGATIVE
NITRITE UA: POSITIVE
SPEC GRAV UA: 1.025
Urobilinogen, UA: NEGATIVE
pH, UA: 5

## 2015-06-04 LAB — POCT UA - MICROSCOPIC ONLY
CASTS, UR, LPF, POC: NEGATIVE
CRYSTALS, UR, HPF, POC: NEGATIVE
Mucus, UA: NEGATIVE
YEAST UA: NEGATIVE

## 2015-06-04 LAB — POCT GLYCOSYLATED HEMOGLOBIN (HGB A1C): HEMOGLOBIN A1C: 6.6

## 2015-06-04 MED ORDER — SIMVASTATIN 20 MG PO TABS: ORAL_TABLET | ORAL | Status: DC

## 2015-06-04 MED ORDER — HYDROCHLOROTHIAZIDE 25 MG PO TABS: 25.0000 mg | ORAL_TABLET | Freq: Every day | ORAL | Status: DC

## 2015-06-04 MED ORDER — METFORMIN HCL 500 MG PO TABS: ORAL_TABLET | ORAL | Status: DC

## 2015-06-04 MED ORDER — LOSARTAN POTASSIUM 100 MG PO TABS: 100.0000 mg | ORAL_TABLET | Freq: Every day | ORAL | Status: DC

## 2015-06-04 MED ORDER — AMLODIPINE BESYLATE 10 MG PO TABS: 10.0000 mg | ORAL_TABLET | Freq: Every day | ORAL | Status: DC

## 2015-06-04 MED ORDER — CARVEDILOL 12.5 MG PO TABS: 12.5000 mg | ORAL_TABLET | Freq: Two times a day (BID) | ORAL | Status: DC

## 2015-06-04 NOTE — Progress Notes (Signed)
Subjective:    Patient ID: Kathryn Cook, female    DOB: 1951/06/18, 64 y.o.   MRN: [id number]  HPI  64 year old female, resident of a group home who has some dementia, diabetes, chronic kidney disease stage III, and hypertension. There is also a history of a remote CVA but she has no sequelae detectable. The dementia is classified as vascular as opposed to Alzheimer's or other form  Patient Active Problem List   Diagnosis Date Noted  . CVA (cerebral vascular accident) 12/11/2014  . Moderate dementia without behavioral disturbance 12/06/2013  . Dementia 10/18/2013  . Tobacco abuse 10/18/2013  . HTN (hypertension), malignant 10/18/2013  . Diabetes mellitus 10/01/2013  . Cough 08/27/2013  . Tobacco use disorder 08/27/2013  . H/O: CVA (cerebrovascular accident) 08/27/2013  . Vascular dementia 08/27/2013  . Diastolic congestive heart failure, NYHA class 1 06/13/2013  . LVH (left ventricular hypertrophy) due to hypertensive disease 06/13/2013  . CVA (cerebral infarction) 06/10/2013  . History of alcohol use 05/31/2013  . HTN (hypertension) 05/31/2013  . Diabetes 05/31/2013  . Memory loss 05/31/2013  . Kidney disease, chronic, stage III (GFR 30-59 ml/min) 05/31/2013   Outpatient Encounter Prescriptions as of 06/04/2015  Medication Sig  . amLODipine (NORVASC) 10 MG tablet Take 1 tablet (10 mg total) by mouth daily.  Marland Kitchen aspirin EC 325 MG tablet TAKE ONE TABLET BY MOUTH ONCE DAILY.  Marland Kitchen Blood Glucose Monitoring Suppl (BLOOD GLUCOSE MONITOR SYSTEM) W/DEVICE KIT Check sugars as directed  . carvedilol (COREG) 6.25 MG tablet Take 1 tablet (6.25 mg total) by mouth 2 (two) times daily with a meal.  . donepezil (ARICEPT) 10 MG tablet TAKE (1) TABLET BY MOUTH AT BEDTIME.  Marland Kitchen escitalopram (LEXAPRO) 10 MG tablet TAKE 1 TABLET BY MOUTH ONCE DAILY AFTER BREAKFAST.  . hydrochlorothiazide (HYDRODIURIL) 25 MG tablet Take 1 tablet (25 mg total) by mouth daily.  . metFORMIN (GLUCOPHAGE) 500 MG tablet  TAKE (1) TABLET BY MOUTH TWICE A DAY WITH MEALS (BREAKFAST AND SUPPER).  . simvastatin (ZOCOR) 20 MG tablet TAKE 1 TABLET BY MOUTH ONCE DAILY AT 6:00 PM.  . therapeutic multivitamin-minerals (THERAGRAN-M) tablet Take 1 tablet by mouth daily.  Marland Kitchen tiotropium (SPIRIVA HANDIHALER) 18 MCG inhalation capsule Place 1 capsule (18 mcg total) into inhaler and inhale daily.  . [DISCONTINUED] nitrofurantoin, macrocrystal-monohydrate, (MACROBID) 100 MG capsule Take 1 capsule (100 mg total) by mouth 2 (two) times daily.   No facility-administered encounter medications on file as of 06/04/2015.      Review of Systems  Constitutional: Negative.   Eyes: Positive for pain.  Genitourinary: Positive for dyspareunia.  Neurological: Negative.   Psychiatric/Behavioral: Positive for confusion.       Objective:   Physical Exam  Constitutional: She is oriented to person, place, and time. She appears well-developed and well-nourished.  Cardiovascular: Normal rate and regular rhythm.   Pulmonary/Chest: Effort normal and breath sounds normal.  Neurological: She is alert and oriented to person, place, and time.  Psychiatric:  Brief testing of her memory such as year day of week month what she ate for lunch or supper last meals were not recalled. She did recall her birthdate however    BP 139/88 mmHg  Pulse 71  Temp(Src) 98.9 F (37.2 C) (Oral)  Ht 5' 4.5" (1.638 m)  Wt 232 lb (105.235 kg)  BMI 39.22 kg/m2       Assessment & Plan:  1. Essential hypertension Record from her residence shows blood pressures that are not very well  controlled. Since she has some dependent edema and takes amlodipine I would like to stop amlodipine in favor of losartan. This will have additional effective renal protection given her diabetes  2. Type 2 diabetes mellitus without complication Sugars are checked at night and have generally been less than 200. Her last A1c was good at 6.3 - POCT glycosylated hemoglobin (Hb  A1C)  3. Moderate dementia without behavioral disturbance We'll continue Aricept although efficacy is debatable  4. Kidney disease, chronic, stage III (GFR 30-59 ml/min) (Angiotensin receptor blocking med  5. Diastolic congestive heart failure, NYHA class 1 She does have some shortness of breath but it's related to her pulmonary disease  6. Abnormal urine odor Past history of UTIs. Odor could suggest infection or concentrated from lack of fluids  Wardell Honour MD - POCT urinalysis dipstick - POCT UA - Microscopic Only

## 2015-06-05 ENCOUNTER — Ambulatory Visit: Payer: Medicaid Other | Admitting: Family Medicine

## 2015-06-06 LAB — URINE CULTURE

## 2015-06-09 ENCOUNTER — Telehealth: Payer: Self-pay | Admitting: *Deleted

## 2015-06-09 ENCOUNTER — Telehealth: Payer: Self-pay | Admitting: Family Medicine

## 2015-06-09 MED ORDER — NITROFURANTOIN MONOHYD MACRO 100 MG PO CAPS: 100.0000 mg | ORAL_CAPSULE | Freq: Two times a day (BID) | ORAL | Status: DC

## 2015-06-09 NOTE — Telephone Encounter (Signed)
-----   Message from Wardell Honour, MD sent at 06/09/2015 10:11 AM EDT ----- Rx: Macrobid 100 mg BID x 7 days

## 2015-06-10 NOTE — Telephone Encounter (Signed)
I don't see that Mobic was mentioned at the last visit. Patient is diabetic with kidney disease so Mobic wouldn't be an appropriate medication for her.  I believe they are talking about Macrobid. This was sent to her pharmacy for a bladder infection. This information was left on the provided number's voicemail.

## 2015-06-12 ENCOUNTER — Ambulatory Visit (INDEPENDENT_AMBULATORY_CARE_PROVIDER_SITE_OTHER): Payer: Medicaid Other | Admitting: Podiatry

## 2015-06-12 ENCOUNTER — Encounter: Payer: Self-pay | Admitting: Podiatry

## 2015-06-12 VITALS — BP 159/92 | HR 67 | Resp 17

## 2015-06-12 DIAGNOSIS — E1140 Type 2 diabetes mellitus with diabetic neuropathy, unspecified: Secondary | ICD-10-CM

## 2015-06-12 DIAGNOSIS — M79676 Pain in unspecified toe(s): Secondary | ICD-10-CM | POA: Diagnosis not present

## 2015-06-12 DIAGNOSIS — E1149 Type 2 diabetes mellitus with other diabetic neurological complication: Secondary | ICD-10-CM | POA: Insufficient documentation

## 2015-06-12 DIAGNOSIS — B351 Tinea unguium: Secondary | ICD-10-CM

## 2015-06-12 NOTE — Progress Notes (Signed)
Subjective:     Patient ID: Kathryn Cook, female   DOB: 1951-08-25, 64 y.o.   MRN: [id number]  HPI 64 year old female presents the office with a caregiver for concerns of thick, painful, elongated toenails for which she is unable to trim her self. She said her nails are long causing discomfort in shoe gear and there is pain with pressure to the toenails. Denies any redness or drainage from the area. She said no recent treatment.  She is diabetic for several years however she is unsure of her last blood sugar A1c. She denies a history of ulceration, denies tingling or numbness and denies claudication symptoms.  Review of Systems  All other systems reviewed and are negative.      Objective:   Physical Exam Awake, alert, NAD; presents with caregiver DP/PT pulses palpable, CRT less than 3 seconds Protective sensation decreased with Simms Weinstein monofilament, vibratory sensation Nails are hypertrophic, dystrophic, brittle, discolored, elongated x10. There is tenderness to palpation overlying nails 1-5 bilaterally. There is no surrounding erythema or drainage on the nail sites. No open lesions or pre-ulcerative lesions identified bilaterally. No other areas of tenderness to bilateral lower extremities. There is no overlying edema, erythema, increase in warmth. MMT 5/5, ROM WNL There is no pain with calf compression, swelling, warmth, erythema.    Assessment:     64 year old female with symptomatic onychomycosis,likely neuropathy     Plan:     -Treatment options discussed including all alternatives, risks, and complications -Nails sharply debrided [id number] without complication/bleeding. -Discussed the importance of daily foot inspection. His main changes to call the office prior to explain. -Discussed the patient likely has neuropathy due to her decreased sensation. Currently is asymptomatic. Will on treatment for now. If it becomes symptomatic consider treatment in the  future. -Follow-up in 3 months or sooner if any problems arise. In the meantime, encouraged to call the office with any questions, concerns, change in symptoms.   Celesta Gentile, DPM

## 2015-06-18 ENCOUNTER — Ambulatory Visit: Payer: Medicaid Other | Admitting: Nurse Practitioner

## 2015-06-23 ENCOUNTER — Encounter: Payer: Self-pay | Admitting: Nurse Practitioner

## 2015-06-23 ENCOUNTER — Ambulatory Visit (INDEPENDENT_AMBULATORY_CARE_PROVIDER_SITE_OTHER): Payer: Medicaid Other | Admitting: Nurse Practitioner

## 2015-06-23 VITALS — BP 146/83 | HR 53 | Ht 64.0 in | Wt 235.0 lb

## 2015-06-23 DIAGNOSIS — F03B0 Unspecified dementia, moderate, without behavioral disturbance, psychotic disturbance, mood disturbance, and anxiety: Secondary | ICD-10-CM

## 2015-06-23 DIAGNOSIS — F0390 Unspecified dementia without behavioral disturbance: Secondary | ICD-10-CM

## 2015-06-23 DIAGNOSIS — I639 Cerebral infarction, unspecified: Secondary | ICD-10-CM

## 2015-06-23 DIAGNOSIS — I10 Essential (primary) hypertension: Secondary | ICD-10-CM | POA: Diagnosis not present

## 2015-06-23 DIAGNOSIS — F0150 Vascular dementia without behavioral disturbance: Secondary | ICD-10-CM | POA: Diagnosis not present

## 2015-06-23 NOTE — Patient Instructions (Signed)
Per group home sheet

## 2015-06-23 NOTE — Progress Notes (Signed)
I agree with the above plan 

## 2015-06-23 NOTE — Progress Notes (Signed)
[facility]  PATIENT: Kathryn Cook DOB: 1950/12/15   REASON FOR VISIT: Follow-up for history of stroke and vascular dementia, hypertension,  depression HISTORY FROM: Patient and caregiver    HISTORY OF PRESENT ILLNESS:: HISTORY: [name] is an 64 y.o. female who was recently diagnosed hypertension and diabetes mellitus, who saw a physician for the first time few weeks ago, prolonged history of smoking, alcohol and recreational drug abuse including crack cocaine, quit all substance abuse and smoking about 3 months ago, also carries a diagnosis of early dementia (question if this is vascular dementia secondary to multiple occult strokes).  Family noticed upon her awakening at 1100 on 06/10/2013 that she had right sided weakness, slurred speech and facial droop. En route CBG 107. BP 210/122. NIHSS=8. Patient was not a TPA candidate secondary to delay in arrival. MRI of the brain 06/10/2013 showed acute non hemorrhagic punctate infarcts of the left thalamus, internal capsule, and insular cortex. Acute non hemorrhagic punctate infarcts of the right internal capsule. Atrophy and extensive white matter disease is advanced for age. Remote lacunar infarcts of the thalami and basal ganglia bilaterally. MRA of the brain showed moderate small vessel disease. Moderate stenosis of the right P1 segment. Atherosclerotic changes within the cavernous carotid arteries bilaterally as well as the right vertebral artery. 2D Echocardiogram showed an EF 55-60% with no source of embolus. Carotid Doppler with no evidence of hemodynamically significant internal carotid artery stenosis. Patient had right hemiparesis, and slurred speech which is resolved. Family states that her short term memory was really affected. She is living at [facility], a 24 hour care home.   Update 12/06/13 (LL): Patient returns for follow up. Since last visit, she states she has been doing well, she is  accompanied only by a group home worker that doesn't know her well. Her mood seems to be improved. Her blood pressure log shows that her BP has been mostly at goal, she is now on 2 additional BP meds. She is tolerating aspirin well without any significant bruising.   Update 12/11/14 [name], 64 year old female returns for follow-up accompanied by a group home representative. Since last visit, patient states that she has been doing well. No illness or hospitalizations. Blood pressure is mildly elevated 149/85 in the office today. She is tolerating aspirin well with no signs of significant bleeding or bruising. She continues to due well on Lexapro and she still smokes part of a cigarette once in awhile. She has not smoked in the last month She is tolerating donepizil well without known side effects. MMSE stable since last visit, 20/30. She gets no exercise. UPDATE  06/23/2015   [name],  64 year old female returns for follow-up accompanied by a group home representative. She reports that her memory is stable. She has not had further stroke or TIA symptoms. She is tolerating her aspirin without significant bleeding or bruising. She has stopped smoking altogether.  Blood pressure is elevated today,  146/83. She is on 3 antihypertensives medications.She remains on Aricept 10 mg daily without side effects. Memory  score is stable.  She returns for reevaluation.  She gets no regular exercise   REVIEW OF SYSTEMS: Full 14 system review of systems performed and notable only for those listed, all others are neg:  Constitutional: neg  Cardiovascular:  High blood pressure Ear/Nose/Throat: neg  Skin: neg Eyes: neg Respiratory: neg Gastroitestinal: neg  Hematology/Lymphatic: neg  Endocrine: neg Musculoskeletal:neg Allergy/Immunology: neg Neurological:  history of stroke and vascular dementia  Psychiatric:  depression Sleep : neg   ALLERGIES: Allergies  Allergen Reactions  . Ace Inhibitors  Cough  . Diovan [Valsartan] Cough    HOME MEDICATIONS: Outpatient Prescriptions Prior to Visit  Medication Sig Dispense Refill  . aspirin EC 325 MG tablet TAKE ONE TABLET BY MOUTH ONCE DAILY. 90 tablet 0  . Blood Glucose Monitoring Suppl (BLOOD GLUCOSE MONITOR SYSTEM) W/DEVICE KIT Check sugars as directed 1 each 0  . carvedilol (COREG) 12.5 MG tablet Take 1 tablet (12.5 mg total) by mouth 2 (two) times daily with a meal. 60 tablet 3  . donepezil (ARICEPT) 10 MG tablet TAKE (1) TABLET BY MOUTH AT BEDTIME. 90 tablet 0  . escitalopram (LEXAPRO) 10 MG tablet TAKE 1 TABLET BY MOUTH ONCE DAILY AFTER BREAKFAST. 90 tablet 0  . hydrochlorothiazide (HYDRODIURIL) 25 MG tablet Take 1 tablet (25 mg total) by mouth daily. 30 tablet 3  . losartan (COZAAR) 100 MG tablet Take 1 tablet (100 mg total) by mouth daily. 90 tablet 0  . metFORMIN (GLUCOPHAGE) 500 MG tablet TAKE (1) TABLET BY MOUTH TWICE A DAY WITH MEALS (BREAKFAST AND SUPPER). 60 tablet 3  . simvastatin (ZOCOR) 20 MG tablet TAKE 1 TABLET BY MOUTH ONCE DAILY AT 6:00 PM. 30 tablet 3  . therapeutic multivitamin-minerals (THERAGRAN-M) tablet Take 1 tablet by mouth daily. 30 tablet 5  . tiotropium (SPIRIVA HANDIHALER) 18 MCG inhalation capsule Place 1 capsule (18 mcg total) into inhaler and inhale daily. 30 capsule 12  . amLODipine (NORVASC) 10 MG tablet Take 1 tablet (10 mg total) by mouth daily. 30 tablet 3  . nitrofurantoin, macrocrystal-monohydrate, (MACROBID) 100 MG capsule Take 1 capsule (100 mg total) by mouth 2 (two) times daily. 1 po BId (Patient not taking: Reported on 06/23/2015) 14 capsule 0   No facility-administered medications prior to visit.    PAST MEDICAL HISTORY: Past Medical History  Diagnosis Date  . Hypertension   . Bronchitis   . Diabetes mellitus without complication   . Substance abuse     alcohol, but patient states she doesn't drink that much anymore  . Hyperlipidemia   . CVA (cerebral infarction)   . Memory loss      PAST SURGICAL HISTORY: History reviewed. No pertinent past surgical history.  FAMILY HISTORY: Family History  Problem Relation Age of Onset  . Other Mother     unknown  . Other Father     unknown    SOCIAL HISTORY: Social History   Social History  . Marital Status: Single    Spouse Name: N/A  . Number of Children: 1  . Years of Education: 11   Occupational History  . unemployed    Social History Main Topics  . Smoking status: Former Smoker -- 0.25 packs/day for 45 years    Types: Cigarettes    Quit date: 09/27/2013  . Smokeless tobacco: Never Used  . Alcohol Use: No     Comment: occasional  . Drug Use: No     Comment: last used 3 months ago  . Sexual Activity: Yes   Other Topics Concern  . Not on file   Social History Narrative   Patient is single and lives at [facility] family Care home.   Patient has one child.   Patient is right-handed.   Patient drinks two cups of coffee daily.   Patient has an 11 th grade education.     PHYSICAL EXAM  Filed Vitals:   06/23/15 1122  BP: 146/83  Pulse:  53  Height: 5' 4"  (1.626 m)  Weight: 235 lb (106.595 kg)   Body mass index is 40.32 kg/(m^2). Generalized: Well developed, in no acute distress, Pleasant african [place] female who is obese.  Head: normocephalic and atraumatic. Oropharynx benign  Neck: Supple, no carotid bruits  Cardiac: Regular rate rhythm, no murmur  Musculoskeletal: No deformity   Neurological examination  Mentation: Alert, diminished attention, registration and recall. MMSE 22/30 missing items in orientation, calculation and short term memory recall 0/3. AFT 3, Clock drawing 1/4.  Cranial nerve II-XII: Pupils were equal round reactive to light extraocular movements were full, visual field were full on confrontational test. Mild right lower face weakness. Tongue midline.  hearing was intact to finger rubbing bilaterally. Uvula tongue midline. Head turning and shoulder shrug and  were normal and symmetric.  Motor: normal bulk and tone, full strength in the BUE, BLE, fine finger movements normal, no pronator drift. No focal weakness  Sensory: normal and symmetric to light touch, pinprick, and vibration  Coordination: finger-nose-finger, heel-to-shin bilaterally, no dysmetria  Reflexes: Deep tendon reflexes in the upper and lower extremities are present and symmetric.  Gait and Station: Rising up from seated position without assistance, normal stance, without trunk ataxia, moderate stride, good arm swing, smooth turning, able to perform tiptoe, and heel walking without difficulty. Tandem gait is unsteady no assistive device. Romberg negative   DIAGNOSTIC DATA (LABS, IMAGING, TESTING) -    Component Value Date/Time   NA 143 09/25/2014 1202   NA 139 10/17/2013 1500   K 4.3 09/25/2014 1202   CL 101 09/25/2014 1202   CO2 24 09/25/2014 1202   GLUCOSE 86 09/25/2014 1202   GLUCOSE 134* 10/17/2013 1500   BUN 27 09/25/2014 1202   BUN 26* 10/17/2013 1500   CREATININE 1.06* 09/25/2014 1202   CREATININE 1.14* 10/17/2013 1500   CALCIUM 9.8 09/25/2014 1202   PROT 7.2 09/25/2014 1202   PROT 7.1 06/10/2013 1252   ALBUMIN 3.2* 06/10/2013 1252   AST 25 09/25/2014 1202   ALT 25 09/25/2014 1202   ALKPHOS 67 09/25/2014 1202   BILITOT 0.3 09/25/2014 1202   GFRNONAA 56* 09/25/2014 1202   GFRAA 65 09/25/2014 1202   Lab Results  Component Value Date   CHOL 193 09/25/2014   HDL 105 09/25/2014   LDLCALC 76 09/25/2014   TRIG 60 09/25/2014   CHOLHDL 1.8 09/25/2014   Lab Results  Component Value Date   HGBA1C 6.6 06/04/2015     ASSESSMENT AND PLAN  65 y.o. year old female  has a past medical history of Hypertension;  Diabetes mellitus without complication; Substance abuse; Hyperlipidemia; CVA (cerebral infarction); and Memory loss. here  to follow-up.    Continue aspirin 325 daily  for secondary stroke prevention  Keep Blood pressure less than 130/90  today's  reading 146/83  lipids in December 2015 cholesterol 193 LDL 76  Most recent hemoglobin A1c 6.3   exercise by walking for overall health and well-being  continue Aricept 10 mg daily for memory   continue Lexapro 10 mg for depression  follow-up in 6 months Dennie Bible, [facility], [facility], [facility] [address], [place], [place] [address] [phone]

## 2015-07-03 ENCOUNTER — Other Ambulatory Visit: Payer: Self-pay | Admitting: Neurology

## 2015-07-10 ENCOUNTER — Telehealth: Payer: Self-pay

## 2015-07-10 ENCOUNTER — Other Ambulatory Visit: Payer: Self-pay | Admitting: Neurology

## 2015-07-10 MED ORDER — LOSARTAN POTASSIUM 100 MG PO TABS: 100.0000 mg | ORAL_TABLET | Freq: Every day | ORAL | Status: DC

## 2015-07-10 NOTE — Telephone Encounter (Signed)
Pt has allergy to ACE

## 2015-07-10 NOTE — Telephone Encounter (Signed)
Medicaid -  Dr Sabra Heck has patient on [place]   In order to authorize patient has to have failed an ace inhibitor Benzapril, captopril, enalapril or lisinorpil and I do not see that

## 2015-07-17 ENCOUNTER — Telehealth: Payer: Self-pay

## 2015-07-17 NOTE — Telephone Encounter (Signed)
Insurance prior authorized Losarten 100 mg  Through 07/09/16

## 2015-08-01 ENCOUNTER — Other Ambulatory Visit: Payer: Self-pay | Admitting: Family Medicine

## 2015-08-01 ENCOUNTER — Other Ambulatory Visit: Payer: Self-pay | Admitting: Neurology

## 2015-08-04 ENCOUNTER — Other Ambulatory Visit: Payer: Self-pay | Admitting: *Deleted

## 2015-08-04 MED ORDER — CARVEDILOL 12.5 MG PO TABS: 12.5000 mg | ORAL_TABLET | Freq: Two times a day (BID) | ORAL | Status: DC

## 2015-08-04 MED ORDER — THERAPEUTIC MULTIVIT/MINERAL PO TABS: 1.0000 | ORAL_TABLET | Freq: Every day | ORAL | Status: DC

## 2015-08-13 ENCOUNTER — Other Ambulatory Visit (INDEPENDENT_AMBULATORY_CARE_PROVIDER_SITE_OTHER): Payer: Medicaid Other

## 2015-08-13 DIAGNOSIS — R300 Dysuria: Secondary | ICD-10-CM | POA: Diagnosis not present

## 2015-08-13 LAB — POCT URINALYSIS DIPSTICK
BILIRUBIN UA: NEGATIVE
Glucose, UA: NEGATIVE
KETONES UA: NEGATIVE
LEUKOCYTES UA: NEGATIVE
Nitrite, UA: NEGATIVE
PH UA: 6
Protein, UA: NEGATIVE
RBC UA: NEGATIVE
Urobilinogen, UA: NEGATIVE

## 2015-08-13 LAB — POCT UA - MICROSCOPIC ONLY
Bacteria, U Microscopic: NEGATIVE
CASTS, UR, LPF, POC: NEGATIVE
CRYSTALS, UR, HPF, POC: NEGATIVE
EPITHELIAL CELLS, URINE PER MICROSCOPY: NEGATIVE
MUCUS UA: NEGATIVE
RBC, urine, microscopic: NEGATIVE
WBC, Ur, HPF, POC: NEGATIVE
YEAST UA: NEGATIVE

## 2015-08-13 NOTE — Progress Notes (Signed)
Lab only 

## 2015-08-14 ENCOUNTER — Telehealth: Payer: Self-pay | Admitting: Family Medicine

## 2015-08-14 DIAGNOSIS — F0390 Unspecified dementia without behavioral disturbance: Secondary | ICD-10-CM

## 2015-08-14 DIAGNOSIS — F03B0 Unspecified dementia, moderate, without behavioral disturbance, psychotic disturbance, mood disturbance, and anxiety: Secondary | ICD-10-CM

## 2015-08-15 NOTE — Telephone Encounter (Signed)
Referral placed. Detailed message left that referral has been placed.

## 2015-08-15 NOTE — Telephone Encounter (Signed)
I have never seen the patient so I don't know if I can legitimately send a referral but if you can work it out away or there is a diagnosis to put it under then he can go ahead and send it under my name. Otherwise he may have to wait until Dr. Sabra Heck comes back.

## 2015-08-27 ENCOUNTER — Other Ambulatory Visit: Payer: Self-pay

## 2015-09-04 ENCOUNTER — Ambulatory Visit: Payer: Medicaid Other | Admitting: Family Medicine

## 2015-09-10 ENCOUNTER — Other Ambulatory Visit: Payer: Self-pay | Admitting: Neurology

## 2015-09-11 ENCOUNTER — Encounter: Payer: Self-pay | Admitting: Podiatry

## 2015-09-11 ENCOUNTER — Ambulatory Visit (INDEPENDENT_AMBULATORY_CARE_PROVIDER_SITE_OTHER): Payer: Medicaid Other | Admitting: Podiatry

## 2015-09-11 DIAGNOSIS — E1149 Type 2 diabetes mellitus with other diabetic neurological complication: Secondary | ICD-10-CM

## 2015-09-11 DIAGNOSIS — B351 Tinea unguium: Secondary | ICD-10-CM | POA: Diagnosis not present

## 2015-09-11 DIAGNOSIS — M79676 Pain in unspecified toe(s): Secondary | ICD-10-CM

## 2015-09-13 NOTE — Progress Notes (Signed)
Subjective: 64 y.o. returns the office today for painful, elongated, thickened toenails which she cannot trim herself. Denies any redness or drainage around the nails. Denies any acute changes since last appointment and no new complaints today. Denies any systemic complaints such as fevers, chills, nausea, vomiting.   Objective: AAO 3, NAD DP/PT pulses palpable, CRT less than 3 seconds Protective sensation decreased with Simms Weinstein monofilament Nails hypertrophic, dystrophic, elongated, brittle, discolored 10. There is tenderness overlying the nails 1-5 bilaterally. There is no surrounding erythema or drainage along the nail sites. No open lesions or pre-ulcerative lesions are identified. No other areas of tenderness bilateral lower extremities. No overlying edema, erythema, increased warmth. No pain with calf compression, swelling, warmth, erythema.  Assessment: Patient presents with symptomatic onychomycosis  Plan: -Treatment options including alternatives, risks, complications were discussed -Nails sharply debrided 10 without complication/bleeding. -Discussed daily foot inspection. If there are any changes, to call the office immediately.  -Follow-up in 3 months or sooner if any problems are to arise. In the meantime, encouraged to call the office with any questions, concerns, changes symptoms.  Celesta Gentile, DPM

## 2015-10-07 ENCOUNTER — Other Ambulatory Visit: Payer: Self-pay

## 2015-10-07 MED ORDER — SIMVASTATIN 20 MG PO TABS: ORAL_TABLET | ORAL | Status: DC

## 2015-10-07 MED ORDER — LOSARTAN POTASSIUM 100 MG PO TABS: 100.0000 mg | ORAL_TABLET | Freq: Every day | ORAL | Status: DC

## 2015-10-10 ENCOUNTER — Other Ambulatory Visit: Payer: Self-pay | Admitting: *Deleted

## 2015-10-10 MED ORDER — METFORMIN HCL 500 MG PO TABS: ORAL_TABLET | ORAL | Status: DC

## 2015-10-20 ENCOUNTER — Other Ambulatory Visit: Payer: Self-pay | Admitting: Family Medicine

## 2015-12-01 ENCOUNTER — Encounter: Payer: Self-pay | Admitting: Nurse Practitioner

## 2015-12-01 ENCOUNTER — Ambulatory Visit (INDEPENDENT_AMBULATORY_CARE_PROVIDER_SITE_OTHER): Payer: Medicaid Other | Admitting: Nurse Practitioner

## 2015-12-01 VITALS — BP 126/74 | HR 72 | Ht 64.0 in | Wt 224.6 lb

## 2015-12-01 DIAGNOSIS — R413 Other amnesia: Secondary | ICD-10-CM

## 2015-12-01 DIAGNOSIS — Z8673 Personal history of transient ischemic attack (TIA), and cerebral infarction without residual deficits: Secondary | ICD-10-CM

## 2015-12-01 NOTE — Progress Notes (Signed)
[facility]  PATIENT: Kathryn Cook DOB: November 08, 1950   REASON FOR VISIT:  Follow-up for vascular dementia, essential hypertension, history of CVA HISTORY FROM: patient and Caregiver  HISTORY OF PRESENT ILLNESS:HISTORY: Kathryn Cook is an 65 y.o. female who was recently diagnosed hypertension and diabetes mellitus, who saw a physician for the first time few weeks ago, prolonged history of smoking, alcohol and recreational drug abuse including crack cocaine, quit all substance abuse and smoking about 3 months ago, also carries a diagnosis of early dementia (question if this is vascular dementia secondary to multiple occult strokes).  Family noticed upon her awakening at 1100 on 06/10/2013 that she had right sided weakness, slurred speech and facial droop. En route CBG 107. BP 210/122. NIHSS=8. Patient was not a TPA candidate secondary to delay in arrival. MRI of the brain 06/10/2013 showed acute non hemorrhagic punctate infarcts of the left thalamus, internal capsule, and insular cortex. Acute non hemorrhagic punctate infarcts of the right internal capsule. Atrophy and extensive white matter disease is advanced for age. Remote lacunar infarcts of the thalami and basal ganglia bilaterally. MRA of the brain showed moderate small vessel disease. Moderate stenosis of the right P1 segment. Atherosclerotic changes within the cavernous carotid arteries bilaterally as well as the right vertebral artery. 2D Echocardiogram showed an EF 55-60% with no source of embolus. Carotid Doppler with no evidence of hemodynamically significant internal carotid artery stenosis. Patient had right hemiparesis, and slurred speech which is resolved. Family states that her short term memory was really affected. She is living at [facility], a 24 hour care home.   Update 12/06/13 (LL): Patient returns for follow up. Since last visit, she states she has been doing well, she is accompanied only by a  group home worker that doesn't know her well. Her mood seems to be improved. Her blood pressure log shows that her BP has been mostly at goal, she is now on 2 additional BP meds. She is tolerating aspirin well without any significant bruising.   Update 12/11/14 Kathryn Cook, 65 year old female returns for follow-up accompanied by a family home representative. Since last visit, patient states that she has been doing well. No illness or hospitalizations. Blood pressure is mildly elevated 149/85 in the office today. She is tolerating aspirin well with no signs of significant bleeding or bruising. She continues to due well on Lexapro and she still smokes part of a cigarette once in awhile. She has not smoked in the last month She is tolerating donepizil well without known side effects. MMSE stable since last visit, 20/30. She gets no exercise. UPDATE 06/23/2015 Kathryn Cook, 65 year old female returns for follow-up accompanied by a family  home representative. She reports that her memory is stable. She has not had further stroke or TIA symptoms. She is tolerating her aspirin without significant bleeding or bruising. She has stopped smoking altogether. Blood pressure is elevated today, 146/83. She is on 3 antihypertensives medications.She remains on Aricept 10 mg daily without side effects. Memory score is stable. She returns for reevaluation. She gets no regular exercise UPDATE:  03/06/2017CM   Kathryn Cook, 65 year old female returns for follow-up. She is accompanied by a family home representative. She remains on aspirin for secondary stroke prevention without further stroke or TIA symptoms. She is tolerating without significant bruising or bleeding. She remains on Aricept for memory which is stable. Blood pressure is good in the office today  at 126/74.  She gets no regular exercise. She returns for  reevaluation  REVIEW OF SYSTEMS: Full 14 system review of systems performed and notable only for  those listed, all others are neg:  Constitutional: neg  Cardiovascular: neg Ear/Nose/Throat: neg  Skin: neg Eyes: neg Respiratory: , cough Gastroitestinal:  Urinary frequency  Hematology/Lymphatic: neg  Endocrine: neg Musculoskeletal:neg Allergy/Immunology: neg Neurological:  Memory loss Psychiatric: neg Sleep : neg   ALLERGIES: Allergies  Allergen Reactions  . Ace Inhibitors Cough  . Diovan [Valsartan] Cough    HOME MEDICATIONS: Outpatient Prescriptions Prior to Visit  Medication Sig Dispense Refill  . aspirin EC 325 MG tablet TAKE ONE TABLET BY MOUTH ONCE DAILY. 30 tablet 6  . Blood Glucose Monitoring Suppl (BLOOD GLUCOSE MONITOR SYSTEM) W/DEVICE KIT Check sugars as directed 1 each 0  . carvedilol (COREG) 12.5 MG tablet Take 1 tablet (12.5 mg total) by mouth 2 (two) times daily with a meal. 60 tablet 3  . donepezil (ARICEPT) 10 MG tablet TAKE (1) TABLET BY MOUTH AT BEDTIME. 30 tablet 2  . escitalopram (LEXAPRO) 10 MG tablet TAKE 1 TABLET BY MOUTH ONCE DAILY AFTER BREAKFAST. 30 tablet 6  . hydrochlorothiazide (HYDRODIURIL) 25 MG tablet Take 1 tablet (25 mg total) by mouth daily. 30 tablet 3  . losartan (COZAAR) 100 MG tablet Take 1 tablet (100 mg total) by mouth daily. 30 tablet 0  . metFORMIN (GLUCOPHAGE) 500 MG tablet TAKE (1) TABLET BY MOUTH TWICE A DAY WITH MEALS (BREAKFAST AND SUPPER). 60 tablet 1  . simvastatin (ZOCOR) 20 MG tablet TAKE 1 TABLET BY MOUTH ONCE DAILY AT 6:00 PM. 30 tablet 0  . SPIRIVA HANDIHALER 18 MCG inhalation capsule INHALE 1 CAPSULE DAILY USING HANDIHALER DEVICE AS DIRECTED FOR C.O.P.D. 30 capsule 1  . therapeutic multivitamin-minerals (THERAGRAN-M) tablet Take 1 tablet by mouth daily. 30 tablet 5   No facility-administered medications prior to visit.    PAST MEDICAL HISTORY: Past Medical History  Diagnosis Date  . Hypertension   . Bronchitis   . Diabetes mellitus without complication ([place])   . Substance abuse     alcohol, but patient states  she doesn't drink that much anymore  . Hyperlipidemia   . CVA (cerebral infarction)   . Memory loss     PAST SURGICAL HISTORY: History reviewed. No pertinent past surgical history.  FAMILY HISTORY: Family History  Problem Relation Age of Onset  . Other Mother     unknown  . Other Father     unknown    SOCIAL HISTORY: Social History   Social History  . Marital Status: Single    Spouse Name: N/A  . Number of Children: 1  . Years of Education: 11   Occupational History  . unemployed    Social History Main Topics  . Smoking status: Former Smoker -- 0.25 packs/day for 45 years    Types: Cigarettes    Quit date: 09/27/2013  . Smokeless tobacco: Never Used  . Alcohol Use: No     Comment: occasional  . Drug Use: No     Comment: last used 3 months ago  . Sexual Activity: Yes   Other Topics Concern  . Not on file   Social History Narrative   Patient is single and lives at [facility] family Care home.   Patient has one child.   Patient is right-handed.   Patient drinks two cups of coffee daily.   Patient has an 11 th grade education.     PHYSICAL EXAM  Filed Vitals:   12/01/15 1030  BP:  126/74  Pulse: 72  Height: 5' 4"  (1.626 m)  Weight: 224 lb 9.6 oz (101.878 kg)   Body mass index is 38.53 kg/(m^2). Generalized: Well developed, obese female in no acute distress,   Head: normocephalic and atraumatic. Oropharynx benign  Neck: Supple, no carotid bruits  Cardiac: Regular rate rhythm, no murmur  Musculoskeletal: No deformity   Neurological examination  Mentation: Alert, diminished attention, registration and recall. MMSE 20/30 missing items in orientation, calculation and short term memory recall 0/3. AFT 5,   Cranial nerve II-XII: Pupils were equal round reactive to light extraocular movements were full, visual field were full on confrontational test. No facial  weakness. Tongue midline. hearing was intact to finger rubbing bilaterally. Uvula tongue  midline. Head turning and shoulder shrug and were normal and symmetric.  Motor: normal bulk and tone, full strength in the BUE, BLE, fine finger movements normal, no pronator drift. No focal weakness  Sensory: normal and symmetric to light touch, pinprick, and vibration  Coordination: finger-nose-finger, heel-to-shin bilaterally, no dysmetria  Reflexes: Deep tendon reflexes in the upper and lower extremities are present and symmetric.  Gait and Station: Rising up from seated position without assistance, normal stance, without trunk ataxia, moderate stride, good arm swing, smooth turning, able to perform tiptoe, and heel walking without difficulty. Tandem gait is unsteady no assistive device. Romberg negative  DIAGNOSTIC DATA (LABS, IMAGING, TESTING) - I reviewed patient records, labs, notes, testing and imaging myself where available.    Lab Results  Component Value Date   HGBA1C 6.6 06/04/2015     ASSESSMENT AND PLAN 65 y.o. year old female has a past medical history of Hypertension; Diabetes mellitus without complication; Substance abuse; Hyperlipidemia; CVA (cerebral infarction); and Memory loss. here to follow-up. The patient is a current patient of Dr. Leonie Man  who is out of the office today . This note is sent to the work in doctor.     Continue aspirin 325 daily for secondary stroke prevention Keep Blood pressure less than 130/90 today's reading 126/74 lipids are followed by PCP, continue Zocor Most recent hemoglobin A1c 6.6  05/2015  exercise by walking for overall health and well-being continue Aricept 10 mg daily for memory  continue Lexapro 10 mg for depression  additional questions answered for caregiver follow-up in 6 months Dennie Bible, [facility], [facility], [facility] [address] [place], [place] [address] [phone]

## 2015-12-01 NOTE — Patient Instructions (Signed)
Continue aspirin 325 daily for secondary stroke prevention Keep Blood pressure less than 130/90 today's reading 126/74 lipids in December 2015 cholesterol 193 LDL 76 Most recent hemoglobin A1c 6.6 05/2015  exercise by walking for overall health and well-being continue Aricept 10 mg daily for memory  continue Lexapro 10 mg for depression follow-up in 6 months

## 2015-12-01 NOTE — Progress Notes (Signed)
I have read the note, and I agree with the clinical assessment and plan.  Kathryn Cook,Kathryn Cook   

## 2015-12-05 ENCOUNTER — Other Ambulatory Visit: Payer: Self-pay | Admitting: Family Medicine

## 2015-12-11 ENCOUNTER — Ambulatory Visit (INDEPENDENT_AMBULATORY_CARE_PROVIDER_SITE_OTHER): Payer: Medicaid Other | Admitting: Podiatry

## 2015-12-11 ENCOUNTER — Other Ambulatory Visit: Payer: Self-pay | Admitting: *Deleted

## 2015-12-11 ENCOUNTER — Encounter: Payer: Self-pay | Admitting: Podiatry

## 2015-12-11 DIAGNOSIS — E1149 Type 2 diabetes mellitus with other diabetic neurological complication: Secondary | ICD-10-CM | POA: Diagnosis not present

## 2015-12-11 DIAGNOSIS — M79676 Pain in unspecified toe(s): Secondary | ICD-10-CM | POA: Diagnosis not present

## 2015-12-11 DIAGNOSIS — B351 Tinea unguium: Secondary | ICD-10-CM

## 2015-12-11 MED ORDER — METFORMIN HCL 500 MG PO TABS: ORAL_TABLET | ORAL | Status: DC

## 2015-12-11 NOTE — Progress Notes (Signed)
Patient ID: Kathryn Cook, female   DOB: 03-Apr-1951, 65 y.o.   MRN: [id number]  Subjective: 65 y.o. returns the office today for painful, elongated, thickened toenails which she cannot trim herself. Denies any redness or drainage around the nails. Denies any acute changes since last appointment and no new complaints today. Denies any systemic complaints such as fevers, chills, nausea, vomiting.   Objective: AAO 3, NAD DP/PT pulses palpable, CRT less than 3 seconds Protective sensation decreased with Simms Weinstein monofilament Nails hypertrophic, dystrophic, elongated, brittle, discolored 10. There is tenderness overlying the nails 1-5 bilaterally. There is no surrounding erythema or drainage along the nail sites. No open lesions or pre-ulcerative lesions are identified. No other areas of tenderness bilateral lower extremities. No overlying edema, erythema, increased warmth. No pain with calf compression, swelling, warmth, erythema.  Assessment: Patient presents with symptomatic onychomycosis  Plan: -Treatment options including alternatives, risks, complications were discussed -Nails sharply debrided 10 without complication/bleeding. -Discussed daily foot inspection. If there are any changes, to call the office immediately.  -Follow-up in 3 months or sooner if any problems are to arise. In the meantime, encouraged to call the office with any questions, concerns, changes symptoms.  Celesta Gentile, DPM

## 2015-12-22 ENCOUNTER — Ambulatory Visit: Payer: Medicaid Other | Admitting: Nurse Practitioner

## 2016-01-06 ENCOUNTER — Other Ambulatory Visit: Payer: Self-pay | Admitting: *Deleted

## 2016-01-06 MED ORDER — HYDROCHLOROTHIAZIDE 25 MG PO TABS: 25.0000 mg | ORAL_TABLET | Freq: Every day | ORAL | Status: DC

## 2016-01-19 ENCOUNTER — Encounter: Payer: Medicaid Other | Admitting: *Deleted

## 2016-01-23 ENCOUNTER — Other Ambulatory Visit: Payer: Self-pay | Admitting: *Deleted

## 2016-01-23 DIAGNOSIS — R921 Mammographic calcification found on diagnostic imaging of breast: Secondary | ICD-10-CM

## 2016-01-27 ENCOUNTER — Encounter (HOSPITAL_COMMUNITY): Payer: Medicaid Other

## 2016-01-27 ENCOUNTER — Other Ambulatory Visit: Payer: Self-pay | Admitting: *Deleted

## 2016-01-27 DIAGNOSIS — R921 Mammographic calcification found on diagnostic imaging of breast: Secondary | ICD-10-CM

## 2016-02-03 ENCOUNTER — Other Ambulatory Visit: Payer: Self-pay | Admitting: Family Medicine

## 2016-02-03 ENCOUNTER — Ambulatory Visit (HOSPITAL_COMMUNITY)
Admission: RE | Admit: 2016-02-03 | Discharge: 2016-02-03 | Disposition: A | Discharge status: Home / Self Care | Payer: Medicaid Other | Source: Ambulatory Visit | Attending: Family Medicine | Admitting: Family Medicine

## 2016-02-03 DIAGNOSIS — L723 Sebaceous cyst: Secondary | ICD-10-CM | POA: Insufficient documentation

## 2016-02-03 DIAGNOSIS — R921 Mammographic calcification found on diagnostic imaging of breast: Secondary | ICD-10-CM

## 2016-02-03 DIAGNOSIS — R928 Other abnormal and inconclusive findings on diagnostic imaging of breast: Secondary | ICD-10-CM | POA: Diagnosis present

## 2016-02-09 ENCOUNTER — Other Ambulatory Visit: Payer: Self-pay | Admitting: Neurology

## 2016-02-10 ENCOUNTER — Other Ambulatory Visit: Payer: Self-pay | Admitting: *Deleted

## 2016-02-10 MED ORDER — HYDROCHLOROTHIAZIDE 25 MG PO TABS: 25.0000 mg | ORAL_TABLET | Freq: Every day | ORAL | Status: DC

## 2016-02-13 ENCOUNTER — Ambulatory Visit
Admission: RE | Admit: 2016-02-13 | Discharge: 2016-02-13 | Disposition: A | Discharge status: Home / Self Care | Payer: Medicaid Other | Source: Ambulatory Visit | Attending: Family Medicine | Admitting: Family Medicine

## 2016-02-13 DIAGNOSIS — R921 Mammographic calcification found on diagnostic imaging of breast: Secondary | ICD-10-CM

## 2016-02-20 ENCOUNTER — Other Ambulatory Visit: Payer: Self-pay | Admitting: Family Medicine

## 2016-02-24 NOTE — Telephone Encounter (Signed)
Last seen 06/04/15  Dr Sabra Heck

## 2016-03-02 ENCOUNTER — Ambulatory Visit (INDEPENDENT_AMBULATORY_CARE_PROVIDER_SITE_OTHER): Payer: Medicaid Other | Admitting: Family Medicine

## 2016-03-02 ENCOUNTER — Encounter: Payer: Self-pay | Admitting: Family Medicine

## 2016-03-02 VITALS — BP 154/76 | HR 59 | Temp 97.7°F | Ht 64.0 in | Wt 230.8 lb

## 2016-03-02 DIAGNOSIS — R350 Frequency of micturition: Secondary | ICD-10-CM

## 2016-03-02 DIAGNOSIS — N3000 Acute cystitis without hematuria: Secondary | ICD-10-CM | POA: Diagnosis not present

## 2016-03-02 DIAGNOSIS — Z0000 Encounter for general adult medical examination without abnormal findings: Secondary | ICD-10-CM | POA: Insufficient documentation

## 2016-03-02 LAB — MICROSCOPIC EXAMINATION: WBC, UA: >30 /hpf — AB (ref 0–?)

## 2016-03-02 LAB — URINALYSIS, COMPLETE
Bilirubin, UA: NEGATIVE
Glucose, UA: NEGATIVE
Ketones, UA: NEGATIVE
Nitrite, UA: POSITIVE — AB
PH UA: 5.5 (ref 5.0–7.5)
RBC, UA: NEGATIVE
Specific Gravity, UA: 1.025 (ref 1.005–1.030)
Urobilinogen, Ur: 0.2 mg/dL (ref 0.2–1.0)

## 2016-03-02 MED ORDER — CEPHALEXIN 500 MG PO CAPS: 500.0000 mg | ORAL_CAPSULE | Freq: Three times a day (TID) | ORAL | Status: DC

## 2016-03-02 NOTE — Patient Instructions (Signed)
Great to meet you!  I have started you on Keflex for a UTI, we will call if culture results are concerning.   You will also receive a call to arrange a colonoscopy  Urinary Tract Infection Urinary tract infections (UTIs) can develop anywhere along your urinary tract. Your urinary tract is your body's drainage system for removing wastes and extra water. Your urinary tract includes two kidneys, two ureters, a bladder, and a urethra. Your kidneys are a pair of bean-shaped organs. Each kidney is about the size of your fist. They are located below your ribs, one on each side of your spine. CAUSES Infections are caused by microbes, which are microscopic organisms, including fungi, viruses, and bacteria. These organisms are so small that they can only be seen through a microscope. Bacteria are the microbes that most commonly cause UTIs. SYMPTOMS  Symptoms of UTIs may vary by age and gender of the patient and by the location of the infection. Symptoms in young women typically include a frequent and intense urge to urinate and a painful, burning feeling in the bladder or urethra during urination. Older women and men are more likely to be tired, shaky, and weak and have muscle aches and abdominal pain. A fever may mean the infection is in your kidneys. Other symptoms of a kidney infection include pain in your back or sides below the ribs, nausea, and vomiting. DIAGNOSIS To diagnose a UTI, your caregiver will ask you about your symptoms. Your caregiver will also ask you to provide a urine sample. The urine sample will be tested for bacteria and white blood cells. White blood cells are made by your body to help fight infection. TREATMENT  Typically, UTIs can be treated with medication. Because most UTIs are caused by a bacterial infection, they usually can be treated with the use of antibiotics. The choice of antibiotic and length of treatment depend on your symptoms and the type of bacteria causing your  infection. HOME CARE INSTRUCTIONS  If you were prescribed antibiotics, take them exactly as your caregiver instructs you. Finish the medication even if you feel better after you have only taken some of the medication.  Drink enough water and fluids to keep your urine clear or pale yellow.  Avoid caffeine, tea, and carbonated beverages. They tend to irritate your bladder.  Empty your bladder often. Avoid holding urine for long periods of time.  Empty your bladder before and after sexual intercourse.  After a bowel movement, women should cleanse from front to back. Use each tissue only once. SEEK MEDICAL CARE IF:   You have back pain.  You develop a fever.  Your symptoms do not begin to resolve within 3 days. SEEK IMMEDIATE MEDICAL CARE IF:   You have severe back pain or lower abdominal pain.  You develop chills.  You have nausea or vomiting.  You have continued burning or discomfort with urination. MAKE SURE YOU:   Understand these instructions.  Will watch your condition.  Will get help right away if you are not doing well or get worse.   This information is not intended to replace advice given to you by your health care provider. Make sure you discuss any questions you have with your health care provider.   Document Released: 06/23/2005 Document Revised: 06/04/2015 Document Reviewed: 10/22/2011 Elsevier Interactive Patient Education [employer].

## 2016-03-02 NOTE — Progress Notes (Signed)
   HPI  Patient presents today here for concern for UTI.  Patient spends her last week she's had worsening urinary frequency, foul-smelling dark urine, and urinary incontinence.  She states she's had some symptoms for a while, at least weeks, however they've been much worse lately.  She denies any fever, chills, sweats, nausea, vomiting, or food or fluid intolerance.  Overall she is doing very well.  Her caretaker is here present with her and requests a colonoscopy because of foul-smelling stools. She is not having diarrhea  PMH: Smoking status noted ROS: Per HPI  Objective: BP 154/76 mmHg  Pulse 59  Temp(Src) 97.7 F (36.5 C) (Oral)  Ht 5\' 4"  (1.626 m)  Wt 230 lb 12.8 oz (104.69 kg)  BMI 39.60 kg/m2 Gen: NAD, alert, cooperative with exam HEENT: NCAT CV: RRR, good S1/S2, no murmur Resp: CTABL, no wheezes, non-labored Abd: SNTND, BS present, no guarding or organomegaly no CVA tenderness  Ext: No edema, warm Neuro: Alert and conversational, no gross deficits  Assessment and plan:  # UTI New acute problem Treat with Keflex Urine culture Return to clinic with any concerns  # Healthcare maintenance Referring for colonoscopy, they requested      Orders Placed This Encounter  Procedures  . Urine culture    Order Specific Question:  Source    Answer:  clean catch  . Urinalysis, Complete  . Ambulatory referral to Gastroenterology    Referral Priority:  Routine    Referral Type:  Consultation    Referral Reason:  Specialty Services Required    Number of Visits Requested:  1    Meds ordered this encounter  Medications  . cephALEXin (KEFLEX) 500 MG capsule    Sig: Take 1 capsule (500 mg total) by mouth 3 (three) times daily.    Dispense:  21 capsule    Refill:  Disautel, MD [facility] 03/02/2016, 3:59 PM

## 2016-03-04 LAB — URINE CULTURE

## 2016-03-05 ENCOUNTER — Other Ambulatory Visit: Payer: Self-pay | Admitting: Neurology

## 2016-03-05 ENCOUNTER — Other Ambulatory Visit: Payer: Self-pay | Admitting: Family

## 2016-03-05 NOTE — Telephone Encounter (Signed)
Last seen 06/04/15

## 2016-03-09 ENCOUNTER — Other Ambulatory Visit: Payer: Self-pay | Admitting: *Deleted

## 2016-03-09 MED ORDER — THERAPEUTIC MULTIVIT/MINERAL PO TABS: 1.0000 | ORAL_TABLET | Freq: Every day | ORAL | Status: DC

## 2016-03-09 MED ORDER — HYDROCHLOROTHIAZIDE 25 MG PO TABS: 25.0000 mg | ORAL_TABLET | Freq: Every day | ORAL | Status: DC

## 2016-03-10 ENCOUNTER — Other Ambulatory Visit: Payer: Self-pay | Admitting: Family Medicine

## 2016-03-10 NOTE — Telephone Encounter (Signed)
Calling from [facility]. States patient finished ABT for UTI yesterday. Still has strong foul odor. Can med be refilled or should she come back in. Please call [place] at [phone]

## 2016-03-11 NOTE — Telephone Encounter (Signed)
She had pan sensitive E coli, if she is still symptomsatic she should be seen.   Laroy Apple, MD [facility] 03/11/2016, 12:37 PM

## 2016-03-18 ENCOUNTER — Encounter: Payer: Self-pay | Admitting: Podiatry

## 2016-03-18 ENCOUNTER — Ambulatory Visit (INDEPENDENT_AMBULATORY_CARE_PROVIDER_SITE_OTHER): Payer: Medicaid Other | Admitting: Podiatry

## 2016-03-18 DIAGNOSIS — B351 Tinea unguium: Secondary | ICD-10-CM | POA: Diagnosis not present

## 2016-03-18 DIAGNOSIS — E1149 Type 2 diabetes mellitus with other diabetic neurological complication: Secondary | ICD-10-CM

## 2016-03-18 DIAGNOSIS — Q828 Other specified congenital malformations of skin: Secondary | ICD-10-CM

## 2016-03-18 DIAGNOSIS — L84 Corns and callosities: Secondary | ICD-10-CM | POA: Diagnosis not present

## 2016-03-18 DIAGNOSIS — M79676 Pain in unspecified toe(s): Secondary | ICD-10-CM | POA: Diagnosis not present

## 2016-03-19 NOTE — Progress Notes (Signed)
Patient ID: Kathryn Cook, female   DOB: 1951/04/09, 65 y.o.   MRN: [id number]  Subjective: 65 y.o. returns the office today for painful, elongated, thickened toenails which she cannot trim herself. Denies any redness or drainage around the nails. Denies any acute changes since last appointment and no new complaints today. Denies any systemic complaints such as fevers, chills, nausea, vomiting.   Objective: AAO 3, NAD DP/PT pulses palpable, CRT less than 3 seconds Protective sensation decreased with Simms Weinstein monofilament Nails hypertrophic, dystrophic, elongated, brittle, discolored 10. There is tenderness overlying the nails 1-5 bilaterally. There is no surrounding erythema or drainage along the nail sites. Small hyperkerotic lesion left heel. No underlying ulcer, drainage, or signs of infection. No open lesions or pre-ulcerative lesions are identified. No other areas of tenderness bilateral lower extremities. No overlying edema, erythema, increased warmth. No pain with calf compression, swelling, warmth, erythema.  Assessment: Patient presents with symptomatic onychomycosis; left heel hyperkerotic lesion  Plan: -Treatment options including alternatives, risks, complications were discussed -Nails sharply debrided 10 without complication/bleeding. -Hyperkeratotic lesion debrided 1 without complications or bleeding. Moisturizer to the heels daily. -Discussed daily foot inspection. If there are any changes, to call the office immediately.  -Follow-up in 3 months or sooner if any problems are to arise. In the meantime, encouraged to call the office with any questions, concerns, changes symptoms.  Celesta Gentile, DPM

## 2016-04-06 LAB — HM DIABETES EYE EXAM

## 2016-04-16 ENCOUNTER — Ambulatory Visit: Payer: Medicaid Other | Admitting: Family Medicine

## 2016-04-20 ENCOUNTER — Encounter: Payer: Self-pay | Admitting: Nurse Practitioner

## 2016-04-20 ENCOUNTER — Ambulatory Visit (INDEPENDENT_AMBULATORY_CARE_PROVIDER_SITE_OTHER): Payer: Medicaid Other | Admitting: Nurse Practitioner

## 2016-04-20 VITALS — BP 142/88 | HR 54 | Temp 98.7°F | Ht 64.0 in | Wt 230.0 lb

## 2016-04-20 DIAGNOSIS — R300 Dysuria: Secondary | ICD-10-CM

## 2016-04-20 DIAGNOSIS — N3001 Acute cystitis with hematuria: Secondary | ICD-10-CM

## 2016-04-20 LAB — MICROSCOPIC EXAMINATION

## 2016-04-20 LAB — URINALYSIS, COMPLETE
BILIRUBIN UA: NEGATIVE
Glucose, UA: NEGATIVE
Ketones, UA: NEGATIVE
LEUKOCYTES UA: NEGATIVE
NITRITE UA: POSITIVE — AB
PH UA: 5.5 (ref 5.0–7.5)
Specific Gravity, UA: >1.03 — ABNORMAL HIGH (ref 1.005–1.030)
UUROB: 0.2 mg/dL (ref 0.2–1.0)

## 2016-04-20 MED ORDER — CIPROFLOXACIN HCL 500 MG PO TABS: 500.0000 mg | ORAL_TABLET | Freq: Two times a day (BID) | ORAL | 0 refills | Status: DC

## 2016-04-20 NOTE — Progress Notes (Signed)
   Subjective:    Patient ID: Kathryn Cook, female    DOB: 17-Feb-1951, 65 y.o.   MRN: [id number]  HPI  Patient brought in by caregivers stating that she has been complainig of strong smelling urine. Incontinent all the time.   Review of Systems  Constitutional: Negative.   HENT: Negative.   Respiratory: Negative.   Cardiovascular: Negative.   Genitourinary: Positive for frequency and urgency. Negative for dysuria.  Neurological: Negative.   Psychiatric/Behavioral: Negative.   All other systems reviewed and are negative.      Objective:   Physical Exam  Constitutional: She is oriented to person, place, and time. She appears well-developed and well-nourished. No distress.  Cardiovascular: Normal rate, regular rhythm and normal heart sounds.   Pulmonary/Chest: Effort normal and breath sounds normal.  Abdominal: Soft. Bowel sounds are normal. There is no tenderness.  Genitourinary:  Genitourinary Comments: No CVA tenderness  Neurological: She is alert and oriented to person, place, and time.  Skin: Skin is warm.  Psychiatric: She has a normal mood and affect. Her behavior is normal. Judgment and thought content normal.    BP (!) 142/88 (BP Location: Left Arm, Cuff Size: Large)   Pulse (!) 54   Temp 98.7 F (37.1 C) (Oral)   Ht 5\' 4"  (1.626 m)   Wt 230 lb (104.3 kg)   BMI 39.48 kg/m    Urine (+) nitrites     Assessment & Plan:  1. Dysuria - Urinalysis, Complete  2. Acute cystitis with hematuria Meds ordered this encounter  Medications  . ciprofloxacin (CIPRO) 500 MG tablet    Sig: Take 1 tablet (500 mg total) by mouth 2 (two) times daily.    Dispense:  10 tablet    Refill:  0    Order Specific Question:   Supervising Provider    Answer:   Evette Doffing, CAROL L [[id number]]   Take medication as prescribe Cotton underwear Take shower not bath Cranberry juice, yogurt Force fluids AZO over the counter X2 days Culture pending RTO prn  Mary-Margaret Hassell Done,  FNP

## 2016-04-20 NOTE — Patient Instructions (Signed)
Asymptomatic Bacteriuria, Female Asymptomatic bacteriuria is the presence of a large number of bacteria in your urine without the usual symptoms of burning or frequent urination. The following conditions increase the risk of asymptomatic bacteriuria:  Diabetes mellitus.  Advanced age.  Pregnancy in the first trimester.  Kidney stones.  Kidney transplants.  Leaky kidney tube valve in young children (reflux). Treatment for this condition is not needed in most people and can lead to other problems such as too much yeast and growth of resistant bacteria. However, some people, such as pregnant women, do need treatment to prevent kidney infection. Asymptomatic bacteriuria in pregnancy is also associated with fetal growth restriction, premature labor, and newborn death. HOME CARE INSTRUCTIONS Monitor your condition for any changes. The following actions may help to relieve any discomfort you are feeling:  Drink enough water and fluids to keep your urine clear or pale yellow. Go to the bathroom more often to keep your bladder empty.  Keep the area around your vagina and rectum clean. Wipe yourself from front to back after urinating. SEEK IMMEDIATE MEDICAL CARE IF:  You develop signs of an infection such as:  Burning with urination.  Frequency of voiding.  Back pain.  Fever.  You have blood in the urine.  You develop a fever. MAKE SURE YOU:  Understand these instructions.  Will watch your condition.  Will get help right away if you are not doing well or get worse.   This information is not intended to replace advice given to you by your health care provider. Make sure you discuss any questions you have with your health care provider.   Document Released: 09/13/2005 Document Revised: 10/04/2014 Document Reviewed: 03/05/2013 Elsevier Interactive Patient Education 2016 Elsevier Inc.  

## 2016-04-22 LAB — URINE CULTURE

## 2016-05-05 ENCOUNTER — Other Ambulatory Visit: Payer: Self-pay | Admitting: Family Medicine

## 2016-05-18 ENCOUNTER — Ambulatory Visit (INDEPENDENT_AMBULATORY_CARE_PROVIDER_SITE_OTHER): Payer: Medicaid Other | Admitting: Family Medicine

## 2016-05-18 ENCOUNTER — Encounter: Payer: Self-pay | Admitting: Family Medicine

## 2016-05-18 VITALS — BP 149/91 | HR 60 | Temp 97.7°F | Ht 64.0 in | Wt 237.0 lb

## 2016-05-18 DIAGNOSIS — N183 Chronic kidney disease, stage 3 unspecified: Secondary | ICD-10-CM

## 2016-05-18 DIAGNOSIS — I10 Essential (primary) hypertension: Secondary | ICD-10-CM

## 2016-05-18 DIAGNOSIS — Z87440 Personal history of urinary (tract) infections: Secondary | ICD-10-CM | POA: Diagnosis not present

## 2016-05-18 DIAGNOSIS — E119 Type 2 diabetes mellitus without complications: Secondary | ICD-10-CM | POA: Diagnosis not present

## 2016-05-18 DIAGNOSIS — F0390 Unspecified dementia without behavioral disturbance: Secondary | ICD-10-CM

## 2016-05-18 LAB — URINALYSIS, COMPLETE
BILIRUBIN UA: NEGATIVE
Glucose, UA: NEGATIVE
KETONES UA: NEGATIVE
Leukocytes, UA: NEGATIVE
NITRITE UA: NEGATIVE
SPEC GRAV UA: 1.025 (ref 1.005–1.030)
UUROB: 0.2 mg/dL (ref 0.2–1.0)
pH, UA: 7 (ref 5.0–7.5)

## 2016-05-18 LAB — MICROSCOPIC EXAMINATION

## 2016-05-18 LAB — BAYER DCA HB A1C WAIVED: HB A1C (BAYER DCA - WAIVED): 7.3 % — ABNORMAL HIGH (ref <7.0)

## 2016-05-18 MED ORDER — EASYMAX L BLOOD GLUCOSE DEVI: 1 refills | Status: DC

## 2016-05-18 MED ORDER — AMLODIPINE BESYLATE 5 MG PO TABS: 5.0000 mg | ORAL_TABLET | Freq: Every day | ORAL | 3 refills | Status: DC

## 2016-05-18 NOTE — Progress Notes (Signed)
Subjective:    Patient ID: Kathryn Cook, female    DOB: 26-Nov-1950, 65 y.o.   MRN: [id number]  HPI  Pt here for follow up and management of chronic medical problems which includes Hypertension and diabetes. She is taking medications regularly. According to caregiver who accompanies her today, patient is spending more time in bed. I explained that this may be a function of her dementia. Review of her blood pressures and blood sugars from facility shows an elevation of blood pressure as well as blood sugars. Sugars have been being checked in the evening. Most are above 200. She has gained 7 pounds and approximately the last month.     Patient Active Problem List   Diagnosis Date Noted  . Healthcare maintenance 03/02/2016  . Type II diabetes mellitus with neurological manifestations ([place]) 06/12/2015  . CVA (cerebral vascular accident) ([place]) 12/11/2014  . Moderate dementia without behavioral disturbance 12/06/2013  . Dementia 10/18/2013  . Tobacco abuse 10/18/2013  . HTN (hypertension), malignant 10/18/2013  . Diabetes mellitus ([place]) 10/01/2013  . Cough 08/27/2013  . Tobacco use disorder 08/27/2013  . H/O: CVA (cerebrovascular accident) 08/27/2013  . Vascular dementia 08/27/2013  . Diastolic congestive heart failure, NYHA class 1 ([place]) 06/13/2013  . LVH (left ventricular hypertrophy) due to hypertensive disease 06/13/2013  . CVA (cerebral infarction) 06/10/2013  . History of alcohol use 05/31/2013  . HTN (hypertension) 05/31/2013  . Diabetes ([place]) 05/31/2013  . Memory loss 05/31/2013  . Kidney disease, chronic, stage III (GFR 30-59 ml/min) 05/31/2013   Outpatient Encounter Prescriptions as of 05/18/2016  Medication Sig  . ACCU-CHEK AVIVA PLUS test strip CHECK BLOOD SUGAR ONCE DAILY.  Marland Kitchen aspirin EC 325 MG tablet TAKE ONE TABLET BY MOUTH ONCE DAILY.  Marland Kitchen Blood Glucose Monitoring Suppl (BLOOD GLUCOSE MONITOR SYSTEM) W/DEVICE KIT Check sugars as directed  . carvedilol (COREG)  12.5 MG tablet Take 1 tablet (12.5 mg total) by mouth 2 (two) times daily with a meal.  . donepezil (ARICEPT) 10 MG tablet TAKE (1) TABLET BY MOUTH AT BEDTIME.  Marland Kitchen escitalopram (LEXAPRO) 10 MG tablet TAKE 1 TABLET BY MOUTH ONCE DAILY AFTER BREAKFAST.  Marland Kitchen guaiFENesin (ROBITUSSIN) 100 MG/5ML liquid Take by mouth as needed for cough.  . hydrochlorothiazide (HYDRODIURIL) 25 MG tablet Take 1 tablet (25 mg total) by mouth daily.  Marland Kitchen losartan (COZAAR) 100 MG tablet TAKE ONE TABLET BY MOUTH ONCE DAILY.  . metFORMIN (GLUCOPHAGE) 500 MG tablet TAKE (1) TABLET BY MOUTH TWICE A DAY WITH MEALS (BREAKFAST AND SUPPER).  . simvastatin (ZOCOR) 20 MG tablet TAKE 1 TABLET BY MOUTH ONCE DAILY.  Marland Kitchen SPIRIVA HANDIHALER 18 MCG inhalation capsule INHALE 1 CAPSULE DAILY USING HANDIHALER DEVICE AS DIRECTED FOR C.O.P.D.  . therapeutic multivitamin-minerals (THERAGRAN-M) tablet Take 1 tablet by mouth daily.  . [DISCONTINUED] ciprofloxacin (CIPRO) 500 MG tablet Take 1 tablet (500 mg total) by mouth 2 (two) times daily.   No facility-administered encounter medications on file as of 05/18/2016.      Review of Systems  Constitutional: Negative.   HENT: Negative.   Eyes: Negative.   Respiratory: Negative.   Cardiovascular: Negative.   Gastrointestinal: Negative.   Endocrine: Negative.   Genitourinary: Negative.        Recent UTI - no new symptoms  Musculoskeletal: Negative.   Skin: Negative.   Allergic/Immunologic: Negative.   Neurological: Negative.   Hematological: Negative.   Psychiatric/Behavioral: Negative.        Objective:   Physical Exam  Constitutional: She appears  well-developed and well-nourished.  Cardiovascular: Normal rate, regular rhythm and normal heart sounds.   Pulmonary/Chest: Effort normal and breath sounds normal.  Neurological: She is alert.  Patient knows the month but not the date or year. When asked to remember 3 words at 5 minutes she is able to recall 1 of 3  Psychiatric: She has a  normal mood and affect. Her behavior is normal.   BP (!) 149/91 (BP Location: Right Arm)   Pulse 60   Temp 97.7 F (36.5 C) (Oral)   Ht _0  (1.626 m)   Wt 237 lb (107.5 kg)   BMI 40.68 kg/m         Assessment & Plan:  1. Essential hypertension Blood pressure is not well controlled on losartan and hydrochlorothiazide. Also takes carvedilol. Will add amlodipine 5 mg - CMP14+EGFR  2. Type 2 diabetes mellitus without complication, without long-term current use of insulin (HCC) Last A1c almost 1 year ago was 6.6. I suspect looking at her glucose readings in the home this will be higher. It is very much related to what she eats - Bayer DCA Hb A1c Waived - Microalbumin / creatinine urine ratio  3. Kidney disease, chronic, stage III (GFR 30-59 ml/min) We'll check CMP today to reassess - Lipid panel  4. Recent urinary tract infection His ulcer pending at time of dictation. She was recently treated for UTI with Cipro - Urinalysis, Complete  Wardell Honour MD  5. Dementia, without behavioral disturbance She continues to take Aricept. There may be some mild progression but this is acting more like a vascular dementia and that does not seem to be aggressive since I have known her.  Wardell Honour MD

## 2016-05-18 NOTE — Patient Instructions (Signed)
Medicare Annual Wellness Visit  [facility] and the medical providers at [facility] strive to bring you the best medical care.  In doing so we not only want to address your current medical conditions and concerns but also to detect new conditions early and prevent illness, disease and health-related problems.    Medicare offers a yearly Wellness Visit which allows our clinical staff to assess your need for preventative services including immunizations, lifestyle education, counseling to decrease risk of preventable diseases and screening for fall risk and other medical concerns.    This visit is provided free of charge (no copay) for all Medicare recipients. The clinical pharmacists at [facility] have begun to conduct these Wellness Visits which will also include a thorough review of all your medications.    As you primary medical provider recommend that you make an appointment for your Annual Wellness Visit if you have not done so already this year.  You may set up this appointment before you leave today or you may call back [phone] and schedule an appointment.  Please make sure when you call that you mention that you are scheduling your Annual Wellness Visit with the clinical pharmacist so that the appointment may be made for the proper length of time.     Continue current medications. Continue good therapeutic lifestyle changes which include good diet and exercise. Fall precautions discussed with patient. If an FOBT was given today- please return it to our front desk. If you are over 72 years old - you may need Prevnar 61 or the adult Pneumonia vaccine.  Return late September ot early OCT for flu and pneumonia vaccine.  After your visit with [place] today you will receive a survey in the mail or online from [employer] regarding your care with [place]. Please take a moment to fill this out. Your feedback is very important to [place] as  you can help [place] better understand your patient needs as well as improve your experience and satisfaction. WE CARE ABOUT YOU!!!

## 2016-05-19 LAB — MICROALBUMIN / CREATININE URINE RATIO
CREATININE, UR: 99.7 mg/dL
MICROALB/CREAT RATIO: 1257.5 mg/g{creat} — AB (ref 0.0–30.0)
Microalbumin, Urine: 1253.7 ug/mL

## 2016-05-19 LAB — CMP14+EGFR
A/G RATIO: 1.4 (ref 1.2–2.2)
ALBUMIN: 4.3 g/dL (ref 3.6–4.8)
ALT: 17 IU/L (ref 0–32)
AST: 18 IU/L (ref 0–40)
Alkaline Phosphatase: 96 IU/L (ref 39–117)
BUN / CREAT RATIO: 21 (ref 12–28)
BUN: 26 mg/dL (ref 8–27)
Bilirubin Total: 0.2 mg/dL (ref 0.0–1.2)
CALCIUM: 9.8 mg/dL (ref 8.7–10.3)
CO2: 26 mmol/L (ref 18–29)
CREATININE: 1.25 mg/dL — AB (ref 0.57–1.00)
Chloride: 97 mmol/L (ref 96–106)
GFR, EST AFRICAN AMERICAN: 53 mL/min/{1.73_m2} — AB (ref >59)
GFR, EST NON AFRICAN AMERICAN: 46 mL/min/{1.73_m2} — AB (ref >59)
GLOBULIN, TOTAL: 3 g/dL (ref 1.5–4.5)
Glucose: 145 mg/dL — ABNORMAL HIGH (ref 65–99)
POTASSIUM: 4.6 mmol/L (ref 3.5–5.2)
SODIUM: 140 mmol/L (ref 134–144)
TOTAL PROTEIN: 7.3 g/dL (ref 6.0–8.5)

## 2016-05-19 LAB — LIPID PANEL
CHOL/HDL RATIO: 1.7 ratio (ref 0.0–4.4)
Cholesterol, Total: 178 mg/dL (ref 100–199)
HDL: 107 mg/dL (ref >39)
LDL CALC: 56 mg/dL (ref 0–99)
Triglycerides: 73 mg/dL (ref 0–149)
VLDL Cholesterol Cal: 15 mg/dL (ref 5–40)

## 2016-06-04 ENCOUNTER — Other Ambulatory Visit: Payer: Self-pay | Admitting: Neurology

## 2016-06-07 ENCOUNTER — Ambulatory Visit: Payer: Medicaid Other | Admitting: Nurse Practitioner

## 2016-06-24 ENCOUNTER — Ambulatory Visit (INDEPENDENT_AMBULATORY_CARE_PROVIDER_SITE_OTHER): Payer: Medicaid Other | Admitting: Podiatry

## 2016-06-24 ENCOUNTER — Encounter: Payer: Self-pay | Admitting: Podiatry

## 2016-06-24 DIAGNOSIS — B351 Tinea unguium: Secondary | ICD-10-CM | POA: Diagnosis not present

## 2016-06-24 DIAGNOSIS — M79676 Pain in unspecified toe(s): Secondary | ICD-10-CM

## 2016-06-24 DIAGNOSIS — E1149 Type 2 diabetes mellitus with other diabetic neurological complication: Secondary | ICD-10-CM | POA: Diagnosis not present

## 2016-06-24 DIAGNOSIS — Q828 Other specified congenital malformations of skin: Secondary | ICD-10-CM

## 2016-06-24 NOTE — Progress Notes (Signed)
Patient ID: NGINA ROYER, female   DOB: 1950-12-02, 65 y.o.   MRN: [id number]  Subjective: 65 y.o. returns the office today for painful, elongated, thickened toenails which she cannot trim herself. Denies any redness or drainage around the nails. Denies any acute changes since last appointment and no new complaints today. Denies any systemic complaints such as fevers, chills, nausea, vomiting.   Objective: AAO 3, NAD DP/PT pulses palpable, CRT less than 3 seconds Protective sensation decreased with Simms Weinstein monofilament Nails hypertrophic, dystrophic, elongated, brittle, discolored 10. There is tenderness overlying the nails 1-5 bilaterally. There is no surrounding erythema or drainage along the nail sites. No open lesions or pre-ulcerative lesions are identified. No other areas of tenderness bilateral lower extremities. No overlying edema, erythema, increased warmth. No pain with calf compression, swelling, warmth, erythema.  Assessment: Patient presents with symptomatic onychomycosis; left heel hyperkerotic lesion  Plan: -Treatment options including alternatives, risks, complications were discussed -Nails sharply debrided 10 without complication/bleeding. -Discussed daily foot inspection. If there are any changes, to call the office immediately.  -Follow-up in 3 months or sooner if any problems are to arise. In the meantime, encouraged to call the office with any questions, concerns, changes symptoms.  Celesta Gentile, DPM

## 2016-07-09 ENCOUNTER — Other Ambulatory Visit: Payer: Self-pay | Admitting: Neurology

## 2016-07-20 ENCOUNTER — Encounter: Payer: Self-pay | Admitting: Pediatrics

## 2016-07-20 ENCOUNTER — Ambulatory Visit (INDEPENDENT_AMBULATORY_CARE_PROVIDER_SITE_OTHER): Payer: Medicaid Other | Admitting: Pediatrics

## 2016-07-20 VITALS — BP 135/83 | HR 57 | Temp 98.4°F | Ht 64.0 in | Wt 242.8 lb

## 2016-07-20 DIAGNOSIS — Z23 Encounter for immunization: Secondary | ICD-10-CM | POA: Diagnosis not present

## 2016-07-20 DIAGNOSIS — R399 Unspecified symptoms and signs involving the genitourinary system: Secondary | ICD-10-CM

## 2016-07-20 LAB — URINALYSIS, COMPLETE
BILIRUBIN UA: NEGATIVE
Glucose, UA: NEGATIVE
Ketones, UA: NEGATIVE
LEUKOCYTES UA: NEGATIVE
Nitrite, UA: NEGATIVE
PH UA: 5.5 (ref 5.0–7.5)
RBC UA: NEGATIVE
Specific Gravity, UA: 1.025 (ref 1.005–1.030)
Urobilinogen, Ur: 0.2 mg/dL (ref 0.2–1.0)

## 2016-07-20 LAB — MICROSCOPIC EXAMINATION

## 2016-07-20 NOTE — Addendum Note (Signed)
Addended by: Wardell Heath on: 07/20/2016 12:00 PM   Modules accepted: Orders

## 2016-07-20 NOTE — Progress Notes (Signed)
  Subjective:   Patient ID: Kathryn Cook, female    DOB: 1950-10-31, 65 y.o.   MRN: [id number] CC: Foul Urine Odor and Vaginal Itching  HPI: Kathryn Cook is a 65 y.o. female presenting for Foul Urine Odor and Vaginal Itching  Past 2-3 weeks has had darker urine, foul smelling No abd pain, no back pain +dysuria No urinary urgency, freuqency No fevers Normal appetite Occasional itching in vaginal area No worse than baseline Not every day No discharge  Relevant past medical, surgical, family and social history reviewed. Allergies and medications reviewed and updated. History  Smoking Status  . Former Smoker  . Packs/day: 0.25  . Years: 45.00  . Types: Cigarettes  . Quit date: 09/27/2013  Smokeless Tobacco  . Never Used   ROS: Per HPI   Objective:    BP 135/83   Pulse (!) 57   Temp 98.4 F (36.9 C) (Oral)   Ht 5\' 4"  (1.626 m)   Wt 242 lb 12.8 oz (110.1 kg)   BMI 41.68 kg/m   Wt Readings from Last 3 Encounters:  07/20/16 242 lb 12.8 oz (110.1 kg)  05/18/16 237 lb (107.5 kg)  04/20/16 230 lb (104.3 kg)    Gen: NAD, alert, cooperative with exam, NCAT EYES: EOMI, no conjunctival injection, or no icterus CV: NRRR, normal S1/S2, no murmur, distal pulses 2+ b/l Resp: CTABL, no wheezes, normal WOB Abd: +BS, soft, NTND. No CVA tenderness Ext: No edema, warm Neuro: Alert  Assessment & Plan:  Kathryn Cook was seen today for foul urine odor and vaginal itching.  Diagnoses and all orders for this visit:  UTI symptoms UA with minimal WBC, large epithelial cells, will send for culture Ongoing proteinuria, f/u with PCP Discussed limiting juices, sodas, sweet tea and sugar to help with diabetes control -     Urinalysis, Complete -     Urine culture   Follow up plan: Return in about 4 weeks (around 08/17/2016) for med follow up with Dr. Sabra Heck. Assunta Found, MD [facility]

## 2016-07-22 LAB — URINE CULTURE

## 2016-08-10 ENCOUNTER — Other Ambulatory Visit: Payer: Self-pay | Admitting: Family

## 2016-08-11 ENCOUNTER — Other Ambulatory Visit: Payer: Self-pay | Admitting: *Deleted

## 2016-08-11 MED ORDER — METFORMIN HCL 500 MG PO TABS: ORAL_TABLET | ORAL | 3 refills | Status: DC

## 2016-08-24 ENCOUNTER — Encounter: Payer: Self-pay | Admitting: Family Medicine

## 2016-08-24 ENCOUNTER — Ambulatory Visit (INDEPENDENT_AMBULATORY_CARE_PROVIDER_SITE_OTHER): Payer: Medicaid Other | Admitting: Family Medicine

## 2016-08-24 VITALS — BP 134/75 | HR 78 | Temp 98.6°F | Ht 64.0 in | Wt 243.0 lb

## 2016-08-24 DIAGNOSIS — E119 Type 2 diabetes mellitus without complications: Secondary | ICD-10-CM | POA: Diagnosis not present

## 2016-08-24 DIAGNOSIS — F0150 Vascular dementia without behavioral disturbance: Secondary | ICD-10-CM

## 2016-08-24 DIAGNOSIS — Z87440 Personal history of urinary (tract) infections: Secondary | ICD-10-CM

## 2016-08-24 DIAGNOSIS — I10 Essential (primary) hypertension: Secondary | ICD-10-CM | POA: Diagnosis not present

## 2016-08-24 LAB — BAYER DCA HB A1C WAIVED: HB A1C (BAYER DCA - WAIVED): 6.9 % (ref <7.0)

## 2016-08-24 NOTE — Progress Notes (Signed)
Subjective:    Patient ID: Kathryn Cook, female    DOB: 10/24/1950, 65 y.o.   MRN: [id number]  HPI 65 year old female, resident of group home. She is followed for her type 2 diabetes history of CVA, mild dementia. One of her caregivers who accompanies her today is concerned about sexual behaviors. Sounds like this semester patient but it does not affect other residents. She brings a record of her blood pressures and sugars. Blood pressures of been pretty well controlled but her sugars are up and down. Patient does like to eat and they have to take away food from other residents at the dining table or Maeleigh will eat their food as well as hers.  Patient Active Problem List   Diagnosis Date Noted  . Healthcare maintenance 03/02/2016  . Type II diabetes mellitus with neurological manifestations ([place]) 06/12/2015  . Moderate dementia without behavioral disturbance 12/06/2013  . Dementia 10/18/2013  . Tobacco abuse 10/18/2013  . HTN (hypertension), malignant 10/18/2013  . Diabetes mellitus ([place]) 10/01/2013  . Cough 08/27/2013  . Tobacco use disorder 08/27/2013  . H/O: CVA (cerebrovascular accident) 08/27/2013  . Vascular dementia 08/27/2013  . Diastolic congestive heart failure, NYHA class 1 ([place]) 06/13/2013  . LVH (left ventricular hypertrophy) due to hypertensive disease 06/13/2013  . CVA (cerebral infarction) 06/10/2013  . History of alcohol use 05/31/2013  . HTN (hypertension) 05/31/2013  . Memory loss 05/31/2013  . Kidney disease, chronic, stage III (GFR 30-59 ml/min) 05/31/2013   Outpatient Encounter Prescriptions as of 08/24/2016  Medication Sig  . ACCU-CHEK AVIVA PLUS test strip CHECK BLOOD SUGAR ONCE DAILY.  Marland Kitchen amLODipine (NORVASC) 5 MG tablet Take 1 tablet (5 mg total) by mouth daily.  Marland Kitchen aspirin EC 325 MG tablet TAKE ONE TABLET BY MOUTH ONCE DAILY.  Marland Kitchen Blood Glucose Monitoring Suppl (EASYMAX L BLOOD GLUCOSE) DEVI Check BS QD and PRN. E11.9  . carvedilol (COREG) 12.5 MG  tablet Take 1 tablet (12.5 mg total) by mouth 2 (two) times daily with a meal.  . Dextromethorphan-Guaifenesin (ROBITUSSIN SUGAR FREE) 10-100 MG/5ML liquid Take 5 mLs by mouth as needed.  . donepezil (ARICEPT) 10 MG tablet TAKE (1) TABLET BY MOUTH AT BEDTIME.  Marland Kitchen escitalopram (LEXAPRO) 10 MG tablet TAKE 1 TABLET BY MOUTH ONCE DAILY AFTER BREAKFAST.  . hydrochlorothiazide (HYDRODIURIL) 25 MG tablet Take 1 tablet (25 mg total) by mouth daily.  Marland Kitchen losartan (COZAAR) 100 MG tablet TAKE ONE TABLET BY MOUTH ONCE DAILY.  . metFORMIN (GLUCOPHAGE) 500 MG tablet TAKE (1) TABLET BY MOUTH TWICE A DAY WITH MEALS (BREAKFAST AND SUPPER).  . simvastatin (ZOCOR) 20 MG tablet TAKE 1 TABLET BY MOUTH ONCE DAILY.  Marland Kitchen SPIRIVA HANDIHALER 18 MCG inhalation capsule INHALE 1 CAPSULE DAILY USING HANDIHALER DEVICE AS DIRECTED FOR C.O.P.D.  . therapeutic multivitamin-minerals (THERAGRAN-M) tablet Take 1 tablet by mouth daily.  . [DISCONTINUED] guaiFENesin (ROBITUSSIN) 100 MG/5ML liquid Take by mouth as needed for cough.   No facility-administered encounter medications on file as of 08/24/2016.       Review of Systems  Constitutional: Negative.   HENT: Negative.   Respiratory: Negative.   Cardiovascular: Negative.   Neurological: Negative.   Psychiatric/Behavioral: Positive for confusion.       Objective:   Physical Exam  Constitutional: She is oriented to person, place, and time. She appears well-developed and well-nourished.  HENT:  Mouth/Throat: Oropharynx is clear and moist.  Cardiovascular: Normal rate, regular rhythm and normal heart sounds.   Pulmonary/Chest: Effort normal.  Neurological:  She is alert and oriented to person, place, and time.  Psychiatric: She has a normal mood and affect. Her behavior is normal.   BP 134/75   Pulse 78   Temp 98.6 F (37 C) (Oral)   Ht 5\' 4"  (1.626 m)   Wt 243 lb (110.2 kg)   BMI 41.71 kg/m         Assessment & Plan:  1. Type 2 diabetes mellitus without  complication, without long-term current use of insulin (HCC) Last A1c was 7.3. Based on record reviewed expect it will be higher today - Bayer DCA Hb A1c Waived  2. Recent urinary tract infection History of malodorous urine. Could be related to hydration or lack of with concentrated urine but will check for infection - Urinalysis, Complete  3. Essential hypertension Blood pressures are well controlled. Continue same   4. Vascular dementia without behavioral disturbance Continue Aricept and control blood pressure. For sexual behaviors will try Paxil. Discontinue Lexapro.  Wardell Honour MD

## 2016-08-25 ENCOUNTER — Ambulatory Visit: Payer: Medicaid Other | Admitting: Family Medicine

## 2016-09-03 ENCOUNTER — Ambulatory Visit: Payer: Medicaid Other | Admitting: Podiatry

## 2016-09-09 ENCOUNTER — Other Ambulatory Visit: Payer: Self-pay | Admitting: Family

## 2016-09-09 ENCOUNTER — Other Ambulatory Visit: Payer: Self-pay | Admitting: Family Medicine

## 2016-09-14 ENCOUNTER — Other Ambulatory Visit: Payer: Self-pay | Admitting: *Deleted

## 2016-09-14 MED ORDER — ASPIRIN EC 325 MG PO TBEC: 325.0000 mg | DELAYED_RELEASE_TABLET | Freq: Every day | ORAL | 5 refills | Status: DC

## 2016-09-14 MED ORDER — HYDROCHLOROTHIAZIDE 25 MG PO TABS: 25.0000 mg | ORAL_TABLET | Freq: Every day | ORAL | 3 refills | Status: DC

## 2016-09-14 MED ORDER — THERAPEUTIC MULTIVIT/MINERAL PO TABS: 1.0000 | ORAL_TABLET | Freq: Every day | ORAL | 5 refills | Status: DC

## 2016-09-14 MED ORDER — CARVEDILOL 12.5 MG PO TABS: 12.5000 mg | ORAL_TABLET | Freq: Two times a day (BID) | ORAL | 3 refills | Status: DC

## 2016-09-14 NOTE — Addendum Note (Signed)
Addended by: Jamelle Haring on: 09/14/2016 11:05 AM   Modules accepted: Orders

## 2016-09-14 NOTE — Addendum Note (Signed)
Addended by: Jamelle Haring on: 09/14/2016 11:02 AM   Modules accepted: Orders

## 2016-09-14 NOTE — Addendum Note (Signed)
Addended by: Jamelle Haring on: 09/14/2016 11:06 AM   Modules accepted: Orders

## 2016-09-16 ENCOUNTER — Ambulatory Visit: Payer: Medicaid Other | Admitting: Podiatry

## 2016-09-23 ENCOUNTER — Ambulatory Visit: Payer: Medicaid Other | Admitting: Podiatry

## 2016-09-28 ENCOUNTER — Encounter: Payer: Self-pay | Admitting: Family Medicine

## 2016-10-07 ENCOUNTER — Ambulatory Visit (INDEPENDENT_AMBULATORY_CARE_PROVIDER_SITE_OTHER): Payer: Medicare Other | Admitting: Podiatry

## 2016-10-07 DIAGNOSIS — L608 Other nail disorders: Secondary | ICD-10-CM

## 2016-10-07 DIAGNOSIS — M7752 Other enthesopathy of left foot: Secondary | ICD-10-CM

## 2016-10-07 DIAGNOSIS — R600 Localized edema: Secondary | ICD-10-CM

## 2016-10-07 DIAGNOSIS — L603 Nail dystrophy: Secondary | ICD-10-CM

## 2016-10-07 DIAGNOSIS — M79609 Pain in unspecified limb: Secondary | ICD-10-CM | POA: Diagnosis not present

## 2016-10-07 DIAGNOSIS — R609 Edema, unspecified: Secondary | ICD-10-CM

## 2016-10-07 DIAGNOSIS — E0843 Diabetes mellitus due to underlying condition with diabetic autonomic (poly)neuropathy: Secondary | ICD-10-CM

## 2016-10-07 DIAGNOSIS — B351 Tinea unguium: Secondary | ICD-10-CM | POA: Diagnosis not present

## 2016-10-07 DIAGNOSIS — M25572 Pain in left ankle and joints of left foot: Secondary | ICD-10-CM

## 2016-10-07 DIAGNOSIS — M659 Synovitis and tenosynovitis, unspecified: Secondary | ICD-10-CM

## 2016-10-08 ENCOUNTER — Other Ambulatory Visit: Payer: Self-pay | Admitting: Family Medicine

## 2016-10-13 MED ORDER — BETAMETHASONE SOD PHOS & ACET 6 (3-3) MG/ML IJ SUSP: 3.0000 mg | Freq: Once | INTRAMUSCULAR | Status: DC

## 2016-10-13 NOTE — Progress Notes (Signed)
   SUBJECTIVE Patient with a history of diabetes mellitus presents to office today complaining of elongated, thickened nails. Pain while ambulating in shoes. Patient is unable to trim their own nails.  Patient also has a new complaint of pain to the left ankle joint. Patient states the pains been ongoing for several weeks when walking. No alleviating symptoms.  OBJECTIVE General Patient is awake, alert, and oriented x 3 and in no acute distress. Derm Skin is dry and supple bilateral. Negative open lesions or macerations. Remaining integument unremarkable. Nails are tender, long, thickened and dystrophic with subungual debris, consistent with onychomycosis, 1-5 bilateral. No signs of infection noted. Vasc  DP and PT pedal pulses palpable bilaterally. Temperature gradient within normal limits.  Neuro Epicritic and protective threshold sensation diminished bilaterally.  Musculoskeletal Exam pain on palpation to the anterior medial and lateral aspect of the patient's left ankle joint. Bilateral lower extremity edema also noted. No symptomatic pedal deformities noted bilateral. Muscular strength within normal limits.  ASSESSMENT 1. Diabetes Mellitus w/ peripheral neuropathy 2. Onychomycosis of nail due to dermatophyte bilateral 3. Pain in foot bilateral 4. Capsulitis with synovitis left ankle 5. Bilateral lower extremity edema  PLAN OF CARE 1. Patient evaluated today. 2. Instructed to maintain good pedal hygiene and foot care. Stressed importance of controlling blood sugar.  3. Mechanical debridement of nails 1-5 bilaterally performed using a nail nipper. Filed with dremel without incident.  4. Injection of 0.5 mL Celestone Soluspan injected to the patient's left ankle joint  5. Recommend compression stockings for bilateral lower extremity edema 6. Authorization for diabetic shoes was initiated today 7. Return to clinic in 3 mos.     Edrick Kins, DPM [facility]  Dr.  Edrick Kins, [address]                                        [place], [place] [address]                Office [phone]  Fax [phone]

## 2016-11-02 ENCOUNTER — Other Ambulatory Visit: Payer: Self-pay | Admitting: Family

## 2016-11-09 ENCOUNTER — Other Ambulatory Visit: Payer: Self-pay | Admitting: Family Medicine

## 2016-11-29 NOTE — Progress Notes (Deleted)
   Subjective:    Patient ID: Kathryn Cook, female    DOB: June 27, 1951, 66 y.o.   MRN: [id number]  HPI    Review of Systems     Objective:   Physical Exam        Assessment & Plan:

## 2016-11-30 ENCOUNTER — Ambulatory Visit: Payer: Medicaid Other | Admitting: Family Medicine

## 2016-12-01 ENCOUNTER — Encounter: Payer: Self-pay | Admitting: Family Medicine

## 2016-12-01 ENCOUNTER — Telehealth: Payer: Self-pay | Admitting: Family Medicine

## 2016-12-01 NOTE — Telephone Encounter (Signed)
Pt lives at assisted living and is sometimes stubborn about coming. They will call back to reschedule

## 2016-12-07 ENCOUNTER — Other Ambulatory Visit: Payer: Self-pay | Admitting: Family

## 2016-12-07 ENCOUNTER — Other Ambulatory Visit: Payer: Self-pay | Admitting: Neurology

## 2016-12-07 ENCOUNTER — Other Ambulatory Visit: Payer: Self-pay | Admitting: Family Medicine

## 2016-12-10 ENCOUNTER — Encounter: Payer: Self-pay | Admitting: Family Medicine

## 2016-12-10 ENCOUNTER — Ambulatory Visit (INDEPENDENT_AMBULATORY_CARE_PROVIDER_SITE_OTHER): Payer: Medicare Other | Admitting: Family Medicine

## 2016-12-10 VITALS — BP 140/89 | HR 65 | Temp 98.3°F | Ht 64.0 in | Wt 240.0 lb

## 2016-12-10 DIAGNOSIS — I10 Essential (primary) hypertension: Secondary | ICD-10-CM | POA: Diagnosis not present

## 2016-12-10 DIAGNOSIS — E119 Type 2 diabetes mellitus without complications: Secondary | ICD-10-CM | POA: Diagnosis not present

## 2016-12-10 DIAGNOSIS — F0150 Vascular dementia without behavioral disturbance: Secondary | ICD-10-CM | POA: Diagnosis not present

## 2016-12-10 LAB — BAYER DCA HB A1C WAIVED: HB A1C (BAYER DCA - WAIVED): 7.3 % — ABNORMAL HIGH (ref <7.0)

## 2016-12-10 NOTE — Patient Instructions (Signed)
Medicare Annual Wellness Visit  [place] and the medical providers at [facility] strive to bring you the best medical care.  In doing so we not only want to address your current medical conditions and concerns but also to detect new conditions early and prevent illness, disease and health-related problems.    Medicare offers a yearly Wellness Visit which allows our clinical staff to assess your need for preventative services including immunizations, lifestyle education, counseling to decrease risk of preventable diseases and screening for fall risk and other medical concerns.    This visit is provided free of charge (no copay) for all Medicare recipients. The clinical pharmacists at [facility] have begun to conduct these Wellness Visits which will also include a thorough review of all your medications.    As you primary medical provider recommend that you make an appointment for your Annual Wellness Visit if you have not done so already this year.  You may set up this appointment before you leave today or you may call back ([phone]) and schedule an appointment.  Please make sure when you call that you mention that you are scheduling your Annual Wellness Visit with the clinical pharmacist so that the appointment may be made for the proper length of time.     Continue current medications. Continue good therapeutic lifestyle changes which include good diet and exercise. Fall precautions discussed with patient. If an FOBT was given today- please return it to our front desk. If you are over 50 years old - you may need Prevnar 13 or the adult Pneumonia vaccine.  **Flu shots are available--- please call and schedule a FLU-CLINIC appointment**  After your visit with us today you will receive a survey in the mail or online from [employer] regarding your care with us. Please take a moment to fill this out. Your feedback is very  important to us as you can help us better understand your patient needs as well as improve your experience and satisfaction. WE CARE ABOUT YOU!!!    

## 2016-12-10 NOTE — Progress Notes (Signed)
Subjective:    Patient ID: Kathryn Cook, female    DOB: May 30, 1951, 66 y.o.   MRN: [id number]  HPI Pt here for follow up and management of chronic medical problems which includes diabetes. She is taking medication regularly. Patient has been having stool incontinence. Staying in bed more. Appetite is good and gaining weight. She is taking Aricept and sometimes that can cause some issues with loose stools I have suggested that might be held for 2 weeks to see if it makes a difference.    Patient Active Problem List   Diagnosis Date Noted  . Healthcare maintenance 03/02/2016  . Type II diabetes mellitus with neurological manifestations ([place]) 06/12/2015  . Moderate dementia without behavioral disturbance 12/06/2013  . Dementia 10/18/2013  . Tobacco abuse 10/18/2013  . HTN (hypertension), malignant 10/18/2013  . Diabetes mellitus ([place]) 10/01/2013  . Cough 08/27/2013  . Tobacco use disorder 08/27/2013  . H/O: CVA (cerebrovascular accident) 08/27/2013  . Vascular dementia 08/27/2013  . Diastolic congestive heart failure, NYHA class 1 ([place]) 06/13/2013  . LVH (left ventricular hypertrophy) due to hypertensive disease 06/13/2013  . CVA (cerebral infarction) 06/10/2013  . History of alcohol use 05/31/2013  . HTN (hypertension) 05/31/2013  . Memory loss 05/31/2013  . Kidney disease, chronic, stage III (GFR 30-59 ml/min) 05/31/2013   Outpatient Encounter Prescriptions as of 12/10/2016  Medication Sig  . ACCU-CHEK AVIVA PLUS test strip CHECK BLOOD SUGAR ONCE DAILY.  Marland Kitchen amLODipine (NORVASC) 5 MG tablet Take 1 tablet (5 mg total) by mouth daily.  Marland Kitchen aspirin EC 325 MG tablet Take 1 tablet (325 mg total) by mouth daily.  . Blood Glucose Monitoring Suppl (EASYMAX L BLOOD GLUCOSE) DEVI Check BS QD and PRN. E11.9  . carvedilol (COREG) 12.5 MG tablet TAKE (1) TABLET BY MOUTH TWICE DAILY.  Marland Kitchen Dextromethorphan-Guaifenesin (ROBITUSSIN SUGAR FREE) 10-100 MG/5ML liquid Take 5 mLs by mouth as needed.   . donepezil (ARICEPT) 10 MG tablet TAKE (1) TABLET BY MOUTH AT BEDTIME.  . hydrochlorothiazide (HYDRODIURIL) 25 MG tablet TAKE ONE TABLET BY MOUTH ONCE DAILY.  Marland Kitchen losartan (COZAAR) 100 MG tablet TAKE ONE TABLET BY MOUTH ONCE DAILY.  . metFORMIN (GLUCOPHAGE) 500 MG tablet TAKE (1) TABLET BY MOUTH TWICE A DAY.(BREAKFAST AND SUPPER)  . PARoxetine (PAXIL) 20 MG tablet TAKE ONE TABLET BY MOUTH ONCE DAILY.  Marland Kitchen SILTUSSIN SA 100 MG/5ML syrup TAKE 1 TEASPOONFUL BY MOUTH EVERY 4-6 HOURS AS NEEDED FOR COUGH.  . simvastatin (ZOCOR) 20 MG tablet TAKE 1 TABLET BY MOUTH ONCE DAILY.  Marland Kitchen SPIRIVA HANDIHALER 18 MCG inhalation capsule INHALE 1 CAPSULE DAILY USING HANDIHALER DEVICE AS DIRECTED FOR C.O.P.D.  . therapeutic multivitamin-minerals (THERAGRAN-M) tablet Take 1 tablet by mouth daily.  . [DISCONTINUED] escitalopram (LEXAPRO) 10 MG tablet TAKE 1 TABLET BY MOUTH ONCE DAILY AFTER BREAKFAST.   Facility-Administered Encounter Medications as of 12/10/2016  Medication  . betamethasone acetate-betamethasone sodium phosphate (CELESTONE) injection 3 mg     Review of Systems  Constitutional: Negative.   HENT: Negative.   Eyes: Negative.   Respiratory: Negative.   Cardiovascular: Negative.   Gastrointestinal: Positive for diarrhea (loose stool with odor).  Endocrine: Negative.   Genitourinary: Negative.   Musculoskeletal: Negative.   Skin: Negative.   Allergic/Immunologic: Negative.   Neurological: Negative.   Hematological: Negative.   Psychiatric/Behavioral: Negative.        Objective:   Physical Exam  Constitutional: She is oriented to person, place, and time. She appears well-developed and well-nourished.  Cardiovascular: Normal rate,  regular rhythm and normal heart sounds.   Pulmonary/Chest: Effort normal and breath sounds normal.  Neurological: She is alert and oriented to person, place, and time.  Psychiatric: She has a normal mood and affect. Her behavior is normal.   BP 140/89 (BP Location:  Left Arm)   Pulse 65   Temp 98.3 F (36.8 C) (Oral)   Ht 5' 4" (1.626 m)   Wt 240 lb (108.9 kg)   BMI 41.20 kg/m         Assessment & Plan:  1. Type 2 diabetes mellitus without complication, without long-term current use of insulin (HCC) Would expect A1c to be higher with her weight gain and good appetite - CMP14+EGFR - Bayer DCA Hb A1c Waived  2. Essential hypertension Blood pressure today is 140/89. A little bit high but still acceptable - CMP14+EGFR - Lipid panel  3. Vascular dementia without behavioral disturbance I'm not sure the behavioral disturbance is accurately diagnosed with her laying in the bed and stooling. Certainly not hostile but our aggressive behaviors but still a behavioral change nonetheless. I hope holding Aricept might help  Stephen M Miller MD  

## 2016-12-11 LAB — CMP14+EGFR
A/G RATIO: 1.4 (ref 1.2–2.2)
ALBUMIN: 4.3 g/dL (ref 3.6–4.8)
ALT: 17 IU/L (ref 0–32)
AST: 18 IU/L (ref 0–40)
Alkaline Phosphatase: 86 IU/L (ref 39–117)
BILIRUBIN TOTAL: 0.2 mg/dL (ref 0.0–1.2)
BUN / CREAT RATIO: 20 (ref 12–28)
BUN: 26 mg/dL (ref 8–27)
CHLORIDE: 98 mmol/L (ref 96–106)
CO2: 25 mmol/L (ref 18–29)
Calcium: 9.7 mg/dL (ref 8.7–10.3)
Creatinine, Ser: 1.32 mg/dL — ABNORMAL HIGH (ref 0.57–1.00)
GFR, EST AFRICAN AMERICAN: 49 mL/min/{1.73_m2} — AB (ref >59)
GFR, EST NON AFRICAN AMERICAN: 42 mL/min/{1.73_m2} — AB (ref >59)
GLOBULIN, TOTAL: 3.1 g/dL (ref 1.5–4.5)
Glucose: 110 mg/dL — ABNORMAL HIGH (ref 65–99)
POTASSIUM: 4.7 mmol/L (ref 3.5–5.2)
SODIUM: 143 mmol/L (ref 134–144)
TOTAL PROTEIN: 7.4 g/dL (ref 6.0–8.5)

## 2016-12-11 LAB — LIPID PANEL
CHOL/HDL RATIO: 1.9 ratio (ref 0.0–4.4)
Cholesterol, Total: 195 mg/dL (ref 100–199)
HDL: 103 mg/dL (ref >39)
LDL Calculated: 78 mg/dL (ref 0–99)
Triglycerides: 71 mg/dL (ref 0–149)
VLDL Cholesterol Cal: 14 mg/dL (ref 5–40)

## 2016-12-20 ENCOUNTER — Telehealth: Payer: Self-pay | Admitting: Family Medicine

## 2016-12-20 NOTE — Telephone Encounter (Signed)
Patient seen Kathryn Cook 3/16 and was told patient could d/c aricpet for two weeks to see if that's what was causing her diarrhea. Kathryn Cook states that she is still having some diarrhea but no where near like it was. They need a d/c note faxed to [facility] at [phone]. Kathryn Cook states that patient goes to see Neurologist In April and they will wait until then to see what what they should do next about medication. Since Kathryn Cook is gone, will [facility] sign the d/c order?

## 2016-12-21 NOTE — Telephone Encounter (Signed)
Note written and signed - will fax to pharm.

## 2017-01-04 ENCOUNTER — Other Ambulatory Visit: Payer: Self-pay | Admitting: Family Medicine

## 2017-01-06 ENCOUNTER — Ambulatory Visit: Payer: Medicare Other | Admitting: Nurse Practitioner

## 2017-01-07 ENCOUNTER — Encounter: Payer: Self-pay | Admitting: Nurse Practitioner

## 2017-01-10 ENCOUNTER — Ambulatory Visit: Payer: Medicaid Other | Admitting: Podiatry

## 2017-02-01 ENCOUNTER — Other Ambulatory Visit: Payer: Self-pay | Admitting: Family Medicine

## 2017-02-02 ENCOUNTER — Other Ambulatory Visit: Payer: Self-pay | Admitting: Family Medicine

## 2017-02-03 ENCOUNTER — Ambulatory Visit: Payer: Medicare Other | Admitting: Neurology

## 2017-02-28 ENCOUNTER — Emergency Department (HOSPITAL_COMMUNITY): Payer: Medicare Other

## 2017-02-28 ENCOUNTER — Emergency Department (HOSPITAL_COMMUNITY)
Admission: EM | Admit: 2017-02-28 | Discharge: 2017-02-28 | Disposition: A | Discharge status: Home / Self Care | Payer: Medicare Other | Attending: Emergency Medicine | Admitting: Emergency Medicine

## 2017-02-28 ENCOUNTER — Encounter (HOSPITAL_COMMUNITY): Payer: Self-pay

## 2017-02-28 ENCOUNTER — Other Ambulatory Visit: Payer: Self-pay | Admitting: Family Medicine

## 2017-02-28 DIAGNOSIS — I10 Essential (primary) hypertension: Secondary | ICD-10-CM | POA: Diagnosis not present

## 2017-02-28 DIAGNOSIS — Z8673 Personal history of transient ischemic attack (TIA), and cerebral infarction without residual deficits: Secondary | ICD-10-CM | POA: Insufficient documentation

## 2017-02-28 DIAGNOSIS — N390 Urinary tract infection, site not specified: Secondary | ICD-10-CM | POA: Diagnosis not present

## 2017-02-28 DIAGNOSIS — Z79899 Other long term (current) drug therapy: Secondary | ICD-10-CM | POA: Diagnosis not present

## 2017-02-28 DIAGNOSIS — E119 Type 2 diabetes mellitus without complications: Secondary | ICD-10-CM | POA: Diagnosis not present

## 2017-02-28 DIAGNOSIS — Z7984 Long term (current) use of oral hypoglycemic drugs: Secondary | ICD-10-CM | POA: Insufficient documentation

## 2017-02-28 DIAGNOSIS — R0789 Other chest pain: Secondary | ICD-10-CM

## 2017-02-28 DIAGNOSIS — Z8781 Personal history of (healed) traumatic fracture: Secondary | ICD-10-CM | POA: Diagnosis not present

## 2017-02-28 DIAGNOSIS — J209 Acute bronchitis, unspecified: Secondary | ICD-10-CM

## 2017-02-28 DIAGNOSIS — R05 Cough: Secondary | ICD-10-CM | POA: Diagnosis not present

## 2017-02-28 DIAGNOSIS — R32 Unspecified urinary incontinence: Secondary | ICD-10-CM | POA: Diagnosis not present

## 2017-02-28 DIAGNOSIS — Z87891 Personal history of nicotine dependence: Secondary | ICD-10-CM | POA: Diagnosis not present

## 2017-02-28 LAB — BASIC METABOLIC PANEL
Anion gap: 8 (ref 5–15)
BUN: 27 mg/dL — AB (ref 6–20)
CHLORIDE: 104 mmol/L (ref 101–111)
CO2: 29 mmol/L (ref 22–32)
CREATININE: 1.6 mg/dL — AB (ref 0.44–1.00)
Calcium: 9.4 mg/dL (ref 8.9–10.3)
GFR calc Af Amer: 38 mL/min — ABNORMAL LOW (ref >60)
GFR, EST NON AFRICAN AMERICAN: 33 mL/min — AB (ref >60)
GLUCOSE: 88 mg/dL (ref 65–99)
Potassium: 3.9 mmol/L (ref 3.5–5.1)
SODIUM: 141 mmol/L (ref 135–145)

## 2017-02-28 LAB — CBC WITH DIFFERENTIAL/PLATELET
Basophils Absolute: 0 10*3/uL (ref 0.0–0.1)
Basophils Relative: 0 %
EOS ABS: 0 10*3/uL (ref 0.0–0.7)
EOS PCT: 1 %
HCT: 37 % (ref 36.0–46.0)
Hemoglobin: 11.5 g/dL — ABNORMAL LOW (ref 12.0–15.0)
LYMPHS ABS: 1 10*3/uL (ref 0.7–4.0)
Lymphocytes Relative: 16 %
MCH: 27.6 pg (ref 26.0–34.0)
MCHC: 31.1 g/dL (ref 30.0–36.0)
MCV: 88.9 fL (ref 78.0–100.0)
Monocytes Absolute: 0.3 10*3/uL (ref 0.1–1.0)
Monocytes Relative: 5 %
Neutro Abs: 4.9 10*3/uL (ref 1.7–7.7)
Neutrophils Relative %: 78 %
PLATELETS: 204 10*3/uL (ref 150–400)
RBC: 4.16 MIL/uL (ref 3.87–5.11)
RDW: 13.6 % (ref 11.5–15.5)
WBC: 6.2 10*3/uL (ref 4.0–10.5)

## 2017-02-28 LAB — URINALYSIS, ROUTINE W REFLEX MICROSCOPIC
BILIRUBIN URINE: NEGATIVE
GLUCOSE, UA: NEGATIVE mg/dL
HGB URINE DIPSTICK: NEGATIVE
Ketones, ur: NEGATIVE mg/dL
LEUKOCYTES UA: NEGATIVE
NITRITE: POSITIVE — AB
PH: 5 (ref 5.0–8.0)
Protein, ur: 100 mg/dL — AB
SPECIFIC GRAVITY, URINE: 1.015 (ref 1.005–1.030)

## 2017-02-28 LAB — HEPATIC FUNCTION PANEL
ALK PHOS: 62 U/L (ref 38–126)
ALT: 15 U/L (ref 14–54)
AST: 18 U/L (ref 15–41)
Albumin: 3.8 g/dL (ref 3.5–5.0)
BILIRUBIN DIRECT: 0.1 mg/dL (ref 0.1–0.5)
BILIRUBIN INDIRECT: 0.4 mg/dL (ref 0.3–0.9)
BILIRUBIN TOTAL: 0.5 mg/dL (ref 0.3–1.2)
Total Protein: 7.7 g/dL (ref 6.5–8.1)

## 2017-02-28 LAB — I-STAT TROPONIN, ED: Troponin i, poc: 0 ng/mL (ref 0.00–0.08)

## 2017-02-28 MED ORDER — CEPHALEXIN 500 MG PO CAPS: 500.0000 mg | ORAL_CAPSULE | Freq: Three times a day (TID) | ORAL | 0 refills | Status: DC

## 2017-02-28 MED ORDER — SODIUM CHLORIDE 0.9 % IV BOLUS (SEPSIS)
250.0000 mL | Freq: Once | INTRAVENOUS | Status: AC
  Administered 2017-02-28: 250 mL via INTRAVENOUS

## 2017-02-28 MED ORDER — PREDNISONE 20 MG PO TABS: 40.0000 mg | ORAL_TABLET | Freq: Every day | ORAL | 0 refills | Status: DC

## 2017-02-28 MED ORDER — DEXTROSE 5 % IV SOLN
1.0000 g | Freq: Once | INTRAVENOUS | Status: AC
  Administered 2017-02-28: 1 g via INTRAVENOUS
  Filled 2017-02-28: qty 10

## 2017-02-28 MED ORDER — ALBUTEROL SULFATE HFA 108 (90 BASE) MCG/ACT IN AERS: 1.0000 | INHALATION_SPRAY | Freq: Four times a day (QID) | RESPIRATORY_TRACT | 0 refills | Status: DC | PRN

## 2017-02-28 MED ORDER — ALBUTEROL SULFATE (2.5 MG/3ML) 0.083% IN NEBU
5.0000 mg | INHALATION_SOLUTION | Freq: Once | RESPIRATORY_TRACT | Status: AC
  Administered 2017-02-28: 5 mg via RESPIRATORY_TRACT
  Filled 2017-02-28: qty 6

## 2017-02-28 NOTE — ED Notes (Signed)
Pt wheeled to car. Pt verbalized understanding of discharge instructions. Pt taken by Ms. Simpson from [employer].

## 2017-02-28 NOTE — ED Provider Notes (Signed)
[name] DEPT Provider Note   CSN: [id number] Arrival date & time: 02/28/17  1456     History   Chief Complaint Chief Complaint  Patient presents with  . Fatigue    HPI Kathryn Cook is a 66 y.o. female.  HPI History limited due to mild dementia. Patient coming in for several days of generalized weakness and decreased appetite and foul-smelling urine. Patient is incontinent of urine at baseline. She states she has some central chest pain when she coughs. Denies abdominal pain or back pain. No vomiting or diarrhea. Past Medical History:  Diagnosis Date  . Bronchitis   . CVA (cerebral infarction)   . Diabetes mellitus without complication ([place])   . Hyperlipidemia   . Hypertension   . Memory loss   . Stroke ([place])   . Substance abuse    alcohol, but patient states she doesn't drink that much anymore    Patient Active Problem List   Diagnosis Date Noted  . Healthcare maintenance 03/02/2016  . Type II diabetes mellitus with neurological manifestations ([place]) 06/12/2015  . Moderate dementia without behavioral disturbance 12/06/2013  . Dementia 10/18/2013  . Tobacco abuse 10/18/2013  . HTN (hypertension), malignant 10/18/2013  . Diabetes mellitus ([place]) 10/01/2013  . Cough 08/27/2013  . Tobacco use disorder 08/27/2013  . H/O: CVA (cerebrovascular accident) 08/27/2013  . Vascular dementia 08/27/2013  . Diastolic congestive heart failure, NYHA class 1 ([place]) 06/13/2013  . LVH (left ventricular hypertrophy) due to hypertensive disease 06/13/2013  . CVA (cerebral infarction) 06/10/2013  . History of alcohol use 05/31/2013  . HTN (hypertension) 05/31/2013  . Memory loss 05/31/2013  . Kidney disease, chronic, stage III (GFR 30-59 ml/min) 05/31/2013    History reviewed. No pertinent surgical history.  OB History    Gravida Para Term Preterm AB Living   1 1 1     1    SAB TAB Ectopic Multiple Live Births                   Home Medications    Prior to  Admission medications   Medication Sig Start Date End Date Taking? Authorizing Provider  amLODipine (NORVASC) 5 MG tablet Take 1 tablet (5 mg total) by mouth daily. 05/18/16  Yes Wardell Honour, MD  aspirin EC 325 MG tablet TAKE ONE TABLET BY MOUTH ONCE DAILY. 02/02/17  Yes Timmothy Euler, MD  carvedilol (COREG) 12.5 MG tablet TAKE (1) TABLET BY MOUTH TWICE DAILY. 01/04/17  Yes Chipper Herb, MD  hydrochlorothiazide (HYDRODIURIL) 25 MG tablet TAKE ONE TABLET BY MOUTH ONCE DAILY. 12/07/16  Yes Wardell Honour, MD  losartan (COZAAR) 100 MG tablet TAKE ONE TABLET BY MOUTH ONCE DAILY. 02/02/17  Yes Timmothy Euler, MD  metFORMIN (GLUCOPHAGE) 500 MG tablet TAKE (1) TABLET BY MOUTH TWICE A DAY.(BREAKFAST AND SUPPER) 12/07/16  Yes Wardell Honour, MD  PARoxetine (PAXIL) 20 MG tablet TAKE ONE TABLET BY MOUTH ONCE DAILY. 01/04/17  Yes Chipper Herb, MD  SILTUSSIN SA 100 MG/5ML syrup TAKE 1 TEASPOONFUL BY MOUTH EVERY 4-6 HOURS AS NEEDED FOR COUGH. 11/04/16  Yes Hawks, Christy A, FNP  simvastatin (ZOCOR) 20 MG tablet TAKE 1 TABLET BY MOUTH ONCE DAILY. 12/07/16  Yes Wardell Honour, MD  therapeutic multivitamin-minerals [facility]) tablet One po qd 02/02/17  Yes Timmothy Euler, MD  ACCU-CHEK AVIVA PLUS test strip CHECK BLOOD SUGAR ONCE DAILY. 03/10/16   Wardell Honour, MD  albuterol (PROVENTIL HFA;VENTOLIN HFA) 108 [phone] Base) MCG/ACT  inhaler Inhale 1-2 puffs into the lungs every 6 (six) hours as needed for wheezing or shortness of breath. 02/28/17   Julianne Rice, MD  Blood Glucose Monitoring Suppl (EASYMAX L BLOOD GLUCOSE) DEVI Check BS QD and PRN. E11.9 05/18/16   Wardell Honour, MD  cephALEXin (KEFLEX) 500 MG capsule Take 1 capsule (500 mg total) by mouth 3 (three) times daily. 03/01/17   Julianne Rice, MD  predniSONE (DELTASONE) 20 MG tablet Take 2 tablets (40 mg total) by mouth daily. 02/28/17   Julianne Rice, MD  umeclidinium bromide (INCRUSE ELLIPTA) 62.5 MCG/INH AEPB Inhale 1 puff into  the lungs daily. 03/01/17   Chevis Pretty, FNP    Family History Family History  Problem Relation Age of Onset  . Other Mother        unknown  . Other Father        unknown    Social History Social History  Substance Use Topics  . Smoking status: Former Smoker    Packs/day: 0.25    Years: 45.00    Types: Cigarettes    Quit date: 09/27/2013  . Smokeless tobacco: Never Used  . Alcohol use No     Comment: occasional     Allergies   Ace inhibitors and Diovan [valsartan]   Review of Systems Review of Systems  Constitutional: Positive for activity change, appetite change and fatigue.  Respiratory: Positive for cough. Negative for shortness of breath.   Cardiovascular: Positive for chest pain. Negative for palpitations and leg swelling.  Gastrointestinal: Negative for abdominal pain, constipation, diarrhea, nausea and vomiting.  Genitourinary: Negative for dysuria, flank pain, frequency and hematuria.  Musculoskeletal: Negative for back pain, myalgias, neck pain and neck stiffness.  Skin: Negative for rash and wound.  Neurological: Positive for weakness (generalized). Negative for dizziness, light-headedness, numbness and headaches.  Psychiatric/Behavioral: Positive for confusion.  All other systems reviewed and are negative.    Physical Exam Updated Vital Signs BP 137/76 (BP Location: Left Arm)   Pulse 75   Temp 98.9 F (37.2 C) (Oral)   Resp 19   Ht 5\' 4"  (1.626 m)   Wt 90.7 kg (200 lb)   SpO2 95%   BMI 34.33 kg/m   Physical Exam  Constitutional: She appears well-developed and well-nourished. No distress.  HENT:  Head: Normocephalic and atraumatic.  Mouth/Throat: Oropharynx is clear and moist. No oropharyngeal exudate.  Eyes: EOM are normal. Pupils are equal, round, and reactive to light.  Neck: Normal range of motion. Neck supple.  Cardiovascular: Normal rate and regular rhythm.  Exam reveals no gallop and no friction rub.   No murmur  heard. Pulmonary/Chest: Effort normal. She has wheezes.  Expiratory wheezing throughout  Abdominal: Soft. Bowel sounds are normal. There is no tenderness. There is no rebound and no guarding.  Musculoskeletal: Normal range of motion. She exhibits no edema or tenderness.  No midline thoracic or lumbar tenderness. No CVA tenderness.  Neurological: She is alert.  Oriented to person. Answers questions appropriately. 5/5 motor in all extremities. Sensation intact.  Skin: Skin is warm and dry. Capillary refill takes less than 2 seconds. No rash noted. No erythema.  Psychiatric: She has a normal mood and affect. Her behavior is normal.  Nursing note and vitals reviewed.    ED Treatments / Results  Labs (all labs ordered are listed, but only abnormal results are displayed) Labs Reviewed  URINE CULTURE - Abnormal; Notable for the following:       Result Value  Culture >=100,000 COLONIES/mL ESCHERICHIA COLI (*)    Organism ID, Bacteria ESCHERICHIA COLI (*)    All other components within normal limits  URINALYSIS, ROUTINE W REFLEX MICROSCOPIC - Abnormal; Notable for the following:    APPearance HAZY (*)    Protein, ur 100 (*)    Nitrite POSITIVE (*)    Bacteria, UA MANY (*)    Squamous Epithelial / LPF 0-5 (*)    All other components within normal limits  CBC WITH DIFFERENTIAL/PLATELET - Abnormal; Notable for the following:    Hemoglobin 11.5 (*)    All other components within normal limits  BASIC METABOLIC PANEL - Abnormal; Notable for the following:    BUN 27 (*)    Creatinine, Ser 1.60 (*)    GFR calc non Af Amer 33 (*)    GFR calc Af Amer 38 (*)    All other components within normal limits  HEPATIC FUNCTION PANEL  I-STAT TROPOININ, ED    EKG  EKG Interpretation  Date/Time:  Monday February 28 2017 16:14:31 EDT Ventricular Rate:  70 PR Interval:    QRS Duration: 90 QT Interval:  420 QTC Calculation: 454 R Axis:   -22 Text Interpretation:  Sinus rhythm Borderline left axis  deviation Borderline T wave abnormalities Confirmed by Lita Mains  MD, Maddelyn Rocca ([id number]) on 02/28/2017 7:57:41 PM       Radiology No results found.  Procedures Procedures (including critical care time)  Medications Ordered in ED Medications  albuterol (PROVENTIL) (2.5 MG/3ML) 0.083% nebulizer solution 5 mg (5 mg Nebulization Given 02/28/17 1619)  cefTRIAXone (ROCEPHIN) 1 g in dextrose 5 % 50 mL IVPB (0 g Intravenous Stopped 02/28/17 2015)  sodium chloride 0.9 % bolus 250 mL (0 mLs Intravenous Stopped 02/28/17 2015)     Initial Impression / Assessment and Plan / ED Course  I have reviewed the triage vital signs and the nursing notes.  Pertinent labs & imaging results that were available during my care of the patient were reviewed by me and considered in my medical decision making (see chart for details).    Patient with evidence of urinary tract infection. Given IV Rocephin. Patient also has some wheezing on exam. Improved with nebulized albuterol. Will get short course of prednisone. Discharge back to nursing home.  Final Clinical Impressions(s) / ED Diagnoses   Final diagnoses:  Lower urinary tract infectious disease  Atypical chest pain  Bronchitis with bronchospasm    New Prescriptions Discharge Medication List as of 02/28/2017  8:11 PM    START taking these medications   Details  albuterol (PROVENTIL HFA;VENTOLIN HFA) 108 (90 Base) MCG/ACT inhaler Inhale 1-2 puffs into the lungs every 6 (six) hours as needed for wheezing or shortness of breath., Starting Mon 02/28/2017, Print    cephALEXin (KEFLEX) 500 MG capsule Take 1 capsule (500 mg total) by mouth 3 (three) times daily., Starting Tue 03/01/2017, Print    predniSONE (DELTASONE) 20 MG tablet Take 2 tablets (40 mg total) by mouth daily., Starting Mon 02/28/2017, Print         Julianne Rice, MD 03/03/17 [phone]

## 2017-02-28 NOTE — ED Notes (Signed)
cbg 118 with ems

## 2017-02-28 NOTE — ED Triage Notes (Signed)
Pt resident of [facility].  EMS reports pt has been "feeling bad" for past couple of days.  Generalized weakness, decreased appetite, and foul smelling urine.  Pt alert and oriented, denies any pain.

## 2017-02-28 NOTE — ED Notes (Signed)
Patient transported to X-ray 

## 2017-03-01 ENCOUNTER — Telehealth: Payer: Self-pay | Admitting: Family Medicine

## 2017-03-01 MED ORDER — UMECLIDINIUM BROMIDE 62.5 MCG/INH IN AEPB: 1.0000 | INHALATION_SPRAY | Freq: Every day | RESPIRATORY_TRACT | 2 refills | Status: DC

## 2017-03-01 NOTE — Telephone Encounter (Signed)
Changed spirivia to incruse as requested by insurance

## 2017-03-01 NOTE — Telephone Encounter (Signed)
Please advise 

## 2017-03-02 ENCOUNTER — Encounter: Payer: Self-pay | Admitting: Neurology

## 2017-03-02 ENCOUNTER — Ambulatory Visit (INDEPENDENT_AMBULATORY_CARE_PROVIDER_SITE_OTHER): Payer: Medicare Other | Admitting: Neurology

## 2017-03-02 VITALS — BP 142/89 | HR 66 | Ht 64.0 in | Wt 245.8 lb

## 2017-03-02 DIAGNOSIS — F0150 Vascular dementia without behavioral disturbance: Secondary | ICD-10-CM | POA: Diagnosis not present

## 2017-03-02 NOTE — Patient Instructions (Signed)
I had a long d/w patient about his recent stroke, risk for recurrent stroke/TIAs, personally independently reviewed imaging studies and stroke evaluation results and answered questions.Continue aspirin 325 mg daily  for secondary stroke prevention and maintain strict control of hypertension with blood pressure goal below 130/90, diabetes with hemoglobin A1c goal below 6.5% and lipids with LDL cholesterol goal below 70 mg/dL. I also advised the patient to eat a healthy diet with plenty of whole grains, cereals, fruits and vegetables, exercise regularly and maintain ideal body weight. The patient paradoxically seems to be doing better with her memory and cognition today than while she was taking Aricept which he had trouble tolerating due to diarrhea since I recommend she stay off Aricept. No routine scheduled follow-up with me is necessary and she has not had any recurrent stroke. Several years since stable and improved. She will be referred back in the future only as necessary

## 2017-03-02 NOTE — Progress Notes (Signed)
[facility]  PATIENT: Kathryn Cook DOB: November 08, 1950   REASON FOR VISIT:  Follow-up for vascular dementia, essential hypertension, history of CVA HISTORY FROM: patient and Caregiver  HISTORY OF PRESENT ILLNESS:HISTORY: Kathryn Cook is an 66 y.o. female who was recently diagnosed hypertension and diabetes mellitus, who saw a physician for the first time few weeks ago, prolonged history of smoking, alcohol and recreational drug abuse including crack cocaine, quit all substance abuse and smoking about 3 months ago, also carries a diagnosis of early dementia (question if this is vascular dementia secondary to multiple occult strokes).  Family noticed upon her awakening at 1100 on 06/10/2013 that she had right sided weakness, slurred speech and facial droop. En route CBG 107. BP 210/122. NIHSS=8. Patient was not a TPA candidate secondary to delay in arrival. MRI of the brain 06/10/2013 showed acute non hemorrhagic punctate infarcts of the left thalamus, internal capsule, and insular cortex. Acute non hemorrhagic punctate infarcts of the right internal capsule. Atrophy and extensive white matter disease is advanced for age. Remote lacunar infarcts of the thalami and basal ganglia bilaterally. MRA of the brain showed moderate small vessel disease. Moderate stenosis of the right P1 segment. Atherosclerotic changes within the cavernous carotid arteries bilaterally as well as the right vertebral artery. 2D Echocardiogram showed an EF 55-60% with no source of embolus. Carotid Doppler with no evidence of hemodynamically significant internal carotid artery stenosis. Patient had right hemiparesis, and slurred speech which is resolved. Family states that her short term memory was really affected. She is living at [facility], a 24 hour care home.   Update 12/06/13 (LL): Patient returns for follow up. Since last visit, she states she has been doing well, she is accompanied only by a  group home worker that doesn't know her well. Her mood seems to be improved. Her blood pressure log shows that her BP has been mostly at goal, she is now on 2 additional BP meds. She is tolerating aspirin well without any significant bruising.   Update 12/11/14 Kathryn Cook, 66 year old female returns for follow-up accompanied by a family home representative. Since last visit, patient states that she has been doing well. No illness or hospitalizations. Blood pressure is mildly elevated 149/85 in the office today. She is tolerating aspirin well with no signs of significant bleeding or bruising. She continues to due well on Lexapro and she still smokes part of a cigarette once in awhile. She has not smoked in the last month She is tolerating donepizil well without known side effects. MMSE stable since last visit, 20/30. She gets no exercise. UPDATE 06/23/2015 Kathryn Cook, 66 year old female returns for follow-up accompanied by a family  home representative. She reports that her memory is stable. She has not had further stroke or TIA symptoms. She is tolerating her aspirin without significant bleeding or bruising. She has stopped smoking altogether. Blood pressure is elevated today, 146/83. She is on 3 antihypertensives medications.She remains on Aricept 10 mg daily without side effects. Memory score is stable. She returns for reevaluation. She gets no regular exercise UPDATE:  03/06/2017CM   Kathryn Cook, 66 year old female returns for follow-up. She is accompanied by a family home representative. She remains on aspirin for secondary stroke prevention without further stroke or TIA symptoms. She is tolerating without significant bruising or bleeding. She remains on Aricept for memory which is stable. Blood pressure is good in the office today  at 126/74.  She gets no regular exercise. She returns for  reevaluation Update 03/02/2017 : She returns for follow-up after last visit 1 year ago. She is  accompanied by family home representative. The patient apparently had significant diarrhea and Aricept was discontinued almost a year ago. Surprisingly she seems to be doing all right in fact patient feels she is doing better. She is quite interactive she needs a medications to supervise but otherwise she is independent in most activities. She states she is feeling better she is walking better. She's had no falls or injuries. She is feels her appetite is better and she's gained some weight and she is eating a lot more. She has no new neurological complaints. On Mini-Mental status testing today she scored 22/30 which is improved from 20/30 at last visit. REVIEW OF SYSTEMS: Full 14 system review of systems performed and notable only for those listed, all others are neg:   Wheezing, daytime sleepiness, joint swelling, walking difficulty, memory loss and all other systems negative  ALLERGIES: Allergies  Allergen Reactions  . Ace Inhibitors Cough  . Diovan [Valsartan] Cough    HOME MEDICATIONS: Outpatient Medications Prior to Visit  Medication Sig Dispense Refill  . ACCU-CHEK AVIVA PLUS test strip CHECK BLOOD SUGAR ONCE DAILY. 50 each 2  . albuterol (PROVENTIL HFA;VENTOLIN HFA) 108 (90 Base) MCG/ACT inhaler Inhale 1-2 puffs into the lungs every 6 (six) hours as needed for wheezing or shortness of breath. 1 Inhaler 0  . amLODipine (NORVASC) 5 MG tablet Take 1 tablet (5 mg total) by mouth daily. 90 tablet 3  . aspirin EC 325 MG tablet TAKE ONE TABLET BY MOUTH ONCE DAILY. 30 tablet 3  . Blood Glucose Monitoring Suppl (EASYMAX L BLOOD GLUCOSE) DEVI Check BS QD and PRN. E11.9 1 Device 1  . carvedilol (COREG) 12.5 MG tablet TAKE (1) TABLET BY MOUTH TWICE DAILY. 60 tablet 2  . cephALEXin (KEFLEX) 500 MG capsule Take 1 capsule (500 mg total) by mouth 3 (three) times daily. 21 capsule 0  . hydrochlorothiazide (HYDRODIURIL) 25 MG tablet TAKE ONE TABLET BY MOUTH ONCE DAILY. 30 tablet 2  . losartan (COZAAR) 100  MG tablet TAKE ONE TABLET BY MOUTH ONCE DAILY. 30 tablet 3  . metFORMIN (GLUCOPHAGE) 500 MG tablet TAKE (1) TABLET BY MOUTH TWICE A DAY.(BREAKFAST AND SUPPER) 60 tablet 2  . PARoxetine (PAXIL) 20 MG tablet TAKE ONE TABLET BY MOUTH ONCE DAILY. 30 tablet 2  . predniSONE (DELTASONE) 20 MG tablet Take 2 tablets (40 mg total) by mouth daily. 10 tablet 0  . SILTUSSIN SA 100 MG/5ML syrup TAKE 1 TEASPOONFUL BY MOUTH EVERY 4-6 HOURS AS NEEDED FOR COUGH. 236 mL 0  . simvastatin (ZOCOR) 20 MG tablet TAKE 1 TABLET BY MOUTH ONCE DAILY. 30 tablet 2  . therapeutic multivitamin-minerals (THERAGRAN-M) tablet One po qd 30 tablet 5  . umeclidinium bromide (INCRUSE ELLIPTA) 62.5 MCG/INH AEPB Inhale 1 puff into the lungs daily. 30 each 2   Facility-Administered Medications Prior to Visit  Medication Dose Route Frequency Provider Last Rate Last Dose  . betamethasone acetate-betamethasone sodium phosphate (CELESTONE) injection 3 mg  3 mg Intramuscular Once Edrick Kins, DPM        PAST MEDICAL HISTORY: Past Medical History:  Diagnosis Date  . Bronchitis   . CVA (cerebral infarction)   . Diabetes mellitus without complication ([place])   . Hyperlipidemia   . Hypertension   . Memory loss   . Stroke ([place])   . Substance abuse    alcohol, but patient states she doesn't drink that  much anymore    PAST SURGICAL HISTORY: History reviewed. No pertinent surgical history.  FAMILY HISTORY: Family History  Problem Relation Age of Onset  . Other Mother        unknown  . Other Father        unknown    SOCIAL HISTORY: Social History   Social History  . Marital status: Single    Spouse name: N/A  . Number of children: 1  . Years of education: 33   Occupational History  . unemployed    Social History Main Topics  . Smoking status: Former Smoker    Packs/day: 0.25    Years: 45.00    Types: Cigarettes    Quit date: 09/27/2013  . Smokeless tobacco: Never Used  . Alcohol use No     Comment: occasional  .  Drug use: No     Comment: last used 3 months ago  . Sexual activity: Yes   Other Topics Concern  . Not on file   Social History Narrative   Patient is single and lives at [facility] family Care home.   Patient has one child.   Patient is right-handed.   Patient drinks two cups of coffee daily.   Patient has an 11 th grade education.     PHYSICAL EXAM  Vitals:   03/02/17 1512  BP: (!) 142/89  Pulse: 66  Weight: 245 lb 12.8 oz (111.5 kg)  Height: 5\' 4"  (1.626 m)   Body mass index is 42.19 kg/m. Generalized: Well developed, obese female in no acute distress,   Head: normocephalic and atraumatic.   Neck: Supple, no carotid bruits  Cardiac: Regular rate rhythm, no murmur  Musculoskeletal: No deformity   Neurological examination  Mentation: Alert, diminished attention, registration and recall. MMSE 22/30( last visit 20/30) missing items in orientation, calculation , recall and short term memory . Recall 0/3. AFT 5,   Cranial nerve II-XII: Pupils were equal round reactive to light extraocular movements were full, visual field were full on confrontational test. No facial  weakness. Tongue midline. hearing was intact to finger rubbing bilaterally. Uvula tongue midline. Head turning and shoulder shrug and were normal and symmetric.  Motor: normal bulk and tone, full strength in the BUE, BLE, fine finger movements normal, no pronator drift. No focal weakness  Sensory: normal and symmetric to light touch, pinprick, and vibration  Coordination: finger-nose-finger, heel-to-shin bilaterally, no dysmetria  Reflexes: Deep tendon reflexes in the upper and lower extremities are present and symmetric.  Gait and Station: Rising up from seated position without assistance, normal stance, without trunk ataxia, moderate stride, good arm swing, smooth turning, able to perform tiptoe, and heel walking without difficulty. Tandem gait is unsteady no assistive device.   DIAGNOSTIC DATA  (LABS, IMAGING, TESTING) - I reviewed patient records, labs, notes, testing and imaging myself where available.    Lab Results  Component Value Date   HGBA1C 6.6 06/04/2015     ASSESSMENT AND PLAN 66 y.o. year old female has a past medical history of Hypertension; Diabetes mellitus without complication; Substance abuse; Hyperlipidemia; CVA (cerebral infarction); and Memory loss. All stable I had a long d/w patient about his recent stroke, risk for recurrent stroke/TIAs, personally independently reviewed imaging studies and stroke evaluation results and answered questions.Continue aspirin 325 mg daily  for secondary stroke prevention and maintain strict control of hypertension with blood pressure goal below 130/90, diabetes with hemoglobin A1c goal below 6.5% and lipids with LDL cholesterol goal below 70  mg/dL. I also advised the patient to eat a healthy diet with plenty of whole grains, cereals, fruits and vegetables, exercise regularly and maintain ideal body weight. The patient paradoxically seems to be doing better with her memory and cognition today than while she was taking Aricept which he had trouble tolerating due to diarrhea since I recommend she stay off Aricept. No routine scheduled follow-up with me is necessary and she has not had any recurrent stroke.for several years since stable and improved. Greater than 50% time during this 30 minute visit was spent on counseling and coordination of care about her memory loss and answering questions. She will be referred back in the future only as necessary  Antony Contras, MD [facility] [address] [place], [place] [address] [phone]

## 2017-03-02 NOTE — Telephone Encounter (Signed)
[facility] took verbal change of prescription

## 2017-03-03 LAB — URINE CULTURE

## 2017-03-04 ENCOUNTER — Other Ambulatory Visit: Payer: Self-pay | Admitting: Family Medicine

## 2017-03-04 ENCOUNTER — Telehealth: Payer: Self-pay

## 2017-03-04 NOTE — Telephone Encounter (Signed)
Post ED Visit - Positive Culture Follow-up  Culture report reviewed by antimicrobial stewardship pharmacist:  []  Elenor Quinones, Pharm.D. []  Heide Guile, Pharm.D., BCPS AQ-ID []  Parks Neptune, Pharm.D., BCPS []  Alycia Rossetti, Pharm.D., BCPS []  [place], [place].D., BCPS, AAHIVP []  Legrand Como, Pharm.D., BCPS, AAHIVP []  Salome Arnt, PharmD, BCPS []  Dimitri Ped, PharmD, BCPS [x]  Vincenza Hews, PharmD, BCPS  Positive urine culture Treated with Cephalexin, organism sensitive to the same and no further patient follow-up is required at this time.  Genia Del 03/04/2017, 9:47 AM

## 2017-03-21 DIAGNOSIS — E119 Type 2 diabetes mellitus without complications: Secondary | ICD-10-CM | POA: Diagnosis not present

## 2017-03-21 DIAGNOSIS — E1165 Type 2 diabetes mellitus with hyperglycemia: Secondary | ICD-10-CM | POA: Diagnosis not present

## 2017-03-21 DIAGNOSIS — E785 Hyperlipidemia, unspecified: Secondary | ICD-10-CM | POA: Diagnosis not present

## 2017-03-21 DIAGNOSIS — I10 Essential (primary) hypertension: Secondary | ICD-10-CM | POA: Diagnosis not present

## 2017-03-21 DIAGNOSIS — G301 Alzheimer's disease with late onset: Secondary | ICD-10-CM | POA: Diagnosis not present

## 2017-04-05 DIAGNOSIS — E1165 Type 2 diabetes mellitus with hyperglycemia: Secondary | ICD-10-CM | POA: Diagnosis not present

## 2017-04-05 DIAGNOSIS — N184 Chronic kidney disease, stage 4 (severe): Secondary | ICD-10-CM | POA: Diagnosis not present

## 2017-04-05 DIAGNOSIS — G301 Alzheimer's disease with late onset: Secondary | ICD-10-CM | POA: Diagnosis not present

## 2017-04-14 ENCOUNTER — Other Ambulatory Visit: Payer: Self-pay | Admitting: Family Medicine

## 2017-05-02 DIAGNOSIS — N390 Urinary tract infection, site not specified: Secondary | ICD-10-CM | POA: Diagnosis not present

## 2017-05-05 ENCOUNTER — Other Ambulatory Visit: Payer: Self-pay | Admitting: *Deleted

## 2017-05-05 MED ORDER — SIMVASTATIN 20 MG PO TABS: 20.0000 mg | ORAL_TABLET | Freq: Every day | ORAL | 0 refills | Status: DC

## 2017-05-05 MED ORDER — CARVEDILOL 12.5 MG PO TABS: ORAL_TABLET | ORAL | 0 refills | Status: DC

## 2017-05-05 NOTE — Addendum Note (Signed)
Addended by: Antonietta Barcelona D on: 05/05/2017 11:09 AM   Modules accepted: Orders

## 2017-05-11 ENCOUNTER — Other Ambulatory Visit: Payer: Self-pay | Admitting: Family Medicine

## 2017-06-03 DIAGNOSIS — N183 Chronic kidney disease, stage 3 (moderate): Secondary | ICD-10-CM | POA: Diagnosis not present

## 2017-06-03 DIAGNOSIS — I10 Essential (primary) hypertension: Secondary | ICD-10-CM | POA: Diagnosis not present

## 2017-06-03 DIAGNOSIS — Z23 Encounter for immunization: Secondary | ICD-10-CM | POA: Diagnosis not present

## 2017-06-03 DIAGNOSIS — E1165 Type 2 diabetes mellitus with hyperglycemia: Secondary | ICD-10-CM | POA: Diagnosis not present

## 2017-06-03 DIAGNOSIS — R32 Unspecified urinary incontinence: Secondary | ICD-10-CM | POA: Diagnosis not present

## 2017-06-03 DIAGNOSIS — R809 Proteinuria, unspecified: Secondary | ICD-10-CM | POA: Diagnosis not present

## 2017-06-10 ENCOUNTER — Other Ambulatory Visit: Payer: Self-pay | Admitting: Family Medicine

## 2017-06-16 ENCOUNTER — Other Ambulatory Visit (HOSPITAL_COMMUNITY): Payer: Self-pay | Admitting: Nephrology

## 2017-06-16 DIAGNOSIS — N183 Chronic kidney disease, stage 3 unspecified: Secondary | ICD-10-CM

## 2017-06-23 DIAGNOSIS — N390 Urinary tract infection, site not specified: Secondary | ICD-10-CM | POA: Diagnosis not present

## 2017-06-23 DIAGNOSIS — J449 Chronic obstructive pulmonary disease, unspecified: Secondary | ICD-10-CM | POA: Diagnosis not present

## 2017-06-23 DIAGNOSIS — F0390 Unspecified dementia without behavioral disturbance: Secondary | ICD-10-CM | POA: Diagnosis not present

## 2017-06-23 DIAGNOSIS — E785 Hyperlipidemia, unspecified: Secondary | ICD-10-CM | POA: Diagnosis not present

## 2017-06-23 DIAGNOSIS — E11649 Type 2 diabetes mellitus with hypoglycemia without coma: Secondary | ICD-10-CM | POA: Diagnosis not present

## 2017-06-30 ENCOUNTER — Ambulatory Visit (HOSPITAL_COMMUNITY): Admission: RE | Admit: 2017-06-30 | Payer: Medicare Other | Source: Ambulatory Visit

## 2017-07-07 ENCOUNTER — Other Ambulatory Visit: Payer: Self-pay | Admitting: Family Medicine

## 2017-07-08 NOTE — Telephone Encounter (Signed)
Caregiver aware, will call back

## 2017-08-01 ENCOUNTER — Other Ambulatory Visit: Payer: Self-pay | Admitting: Family Medicine

## 2017-08-31 ENCOUNTER — Ambulatory Visit (INDEPENDENT_AMBULATORY_CARE_PROVIDER_SITE_OTHER): Payer: Medicare Other | Admitting: Urology

## 2017-08-31 DIAGNOSIS — N3021 Other chronic cystitis with hematuria: Secondary | ICD-10-CM | POA: Diagnosis not present

## 2017-08-31 DIAGNOSIS — R3915 Urgency of urination: Secondary | ICD-10-CM

## 2017-09-09 ENCOUNTER — Other Ambulatory Visit: Payer: Self-pay | Admitting: Family Medicine

## 2017-10-12 ENCOUNTER — Ambulatory Visit: Payer: Medicare Other | Admitting: Urology

## 2017-10-12 DIAGNOSIS — N390 Urinary tract infection, site not specified: Secondary | ICD-10-CM | POA: Diagnosis not present

## 2017-11-03 DIAGNOSIS — N390 Urinary tract infection, site not specified: Secondary | ICD-10-CM | POA: Diagnosis not present

## 2017-11-03 DIAGNOSIS — E785 Hyperlipidemia, unspecified: Secondary | ICD-10-CM | POA: Diagnosis not present

## 2017-11-03 DIAGNOSIS — E1165 Type 2 diabetes mellitus with hyperglycemia: Secondary | ICD-10-CM | POA: Diagnosis not present

## 2017-11-03 DIAGNOSIS — J209 Acute bronchitis, unspecified: Secondary | ICD-10-CM | POA: Diagnosis not present

## 2017-11-03 DIAGNOSIS — N184 Chronic kidney disease, stage 4 (severe): Secondary | ICD-10-CM | POA: Diagnosis not present

## 2017-11-03 DIAGNOSIS — E119 Type 2 diabetes mellitus without complications: Secondary | ICD-10-CM | POA: Diagnosis not present

## 2017-11-03 DIAGNOSIS — I10 Essential (primary) hypertension: Secondary | ICD-10-CM | POA: Diagnosis not present

## 2017-11-09 DIAGNOSIS — E1165 Type 2 diabetes mellitus with hyperglycemia: Secondary | ICD-10-CM | POA: Diagnosis not present

## 2017-11-09 DIAGNOSIS — J209 Acute bronchitis, unspecified: Secondary | ICD-10-CM | POA: Diagnosis not present

## 2017-11-22 DIAGNOSIS — E1165 Type 2 diabetes mellitus with hyperglycemia: Secondary | ICD-10-CM | POA: Diagnosis not present

## 2017-11-22 DIAGNOSIS — I10 Essential (primary) hypertension: Secondary | ICD-10-CM | POA: Diagnosis not present

## 2017-11-22 DIAGNOSIS — G301 Alzheimer's disease with late onset: Secondary | ICD-10-CM | POA: Diagnosis not present

## 2017-12-19 ENCOUNTER — Encounter: Payer: Self-pay | Admitting: Podiatry

## 2017-12-19 ENCOUNTER — Ambulatory Visit (INDEPENDENT_AMBULATORY_CARE_PROVIDER_SITE_OTHER): Payer: Medicare Other | Admitting: Podiatry

## 2017-12-19 DIAGNOSIS — B351 Tinea unguium: Secondary | ICD-10-CM

## 2017-12-19 DIAGNOSIS — M79609 Pain in unspecified limb: Secondary | ICD-10-CM

## 2017-12-19 DIAGNOSIS — E1142 Type 2 diabetes mellitus with diabetic polyneuropathy: Secondary | ICD-10-CM

## 2017-12-19 NOTE — Progress Notes (Signed)
Complaint:  Visit Type: Patient returns to my office for continued preventative foot care services. Complaint: Patient states" my nails have grown long and thick and become painful to walk and wear shoes" Patient has been diagnosed with DM with neuropathy.. The patient presents for preventative foot care services. No changes to ROS  Podiatric Exam: Vascular: dorsalis pedis and posterior tibial pulses are palpable bilateral. Capillary return is immediate. Temperature gradient is WNL. Skin turgor WNL  Sensorium: Diminished Semmes Weinstein monofilament test. Normal tactile sensation bilaterally. Nail Exam: Pt has thick disfigured discolored nails with subungual debris noted bilateral entire nail hallux through fifth toenails Ulcer Exam: There is no evidence of ulcer or pre-ulcerative changes or infection. Orthopedic Exam: Muscle tone and strength are WNL. No limitations in general ROM. No crepitus or effusions noted. Foot type and digits show no abnormalities. Bony prominences are unremarkable. Skin: No Porokeratosis. No infection or ulcers  Diagnosis:  Onychomycosis, , Pain in right toe, pain in left toes  Treatment & Plan Procedures and Treatment: Consent by patient was obtained for treatment procedures.   Debridement of mycotic and hypertrophic toenails, 1 through 5 bilateral and clearing of subungual debris. No ulceration, no infection noted.  Return Visit-Office Procedure: Patient instructed to return to the office for a follow up visit 3 months for continued evaluation and treatment.    Effrey Davidow DPM 

## 2018-01-31 ENCOUNTER — Other Ambulatory Visit (HOSPITAL_COMMUNITY): Payer: Self-pay | Admitting: Internal Medicine

## 2018-01-31 DIAGNOSIS — Z1331 Encounter for screening for depression: Secondary | ICD-10-CM | POA: Diagnosis not present

## 2018-01-31 DIAGNOSIS — N390 Urinary tract infection, site not specified: Secondary | ICD-10-CM | POA: Diagnosis not present

## 2018-01-31 DIAGNOSIS — R7989 Other specified abnormal findings of blood chemistry: Secondary | ICD-10-CM | POA: Diagnosis not present

## 2018-01-31 DIAGNOSIS — E785 Hyperlipidemia, unspecified: Secondary | ICD-10-CM | POA: Diagnosis not present

## 2018-01-31 DIAGNOSIS — Z23 Encounter for immunization: Secondary | ICD-10-CM | POA: Diagnosis not present

## 2018-01-31 DIAGNOSIS — E119 Type 2 diabetes mellitus without complications: Secondary | ICD-10-CM | POA: Diagnosis not present

## 2018-01-31 DIAGNOSIS — Z0000 Encounter for general adult medical examination without abnormal findings: Secondary | ICD-10-CM | POA: Diagnosis not present

## 2018-01-31 DIAGNOSIS — I10 Essential (primary) hypertension: Secondary | ICD-10-CM | POA: Diagnosis not present

## 2018-01-31 DIAGNOSIS — Z1389 Encounter for screening for other disorder: Secondary | ICD-10-CM | POA: Diagnosis not present

## 2018-01-31 DIAGNOSIS — G301 Alzheimer's disease with late onset: Secondary | ICD-10-CM | POA: Diagnosis not present

## 2018-01-31 DIAGNOSIS — E1165 Type 2 diabetes mellitus with hyperglycemia: Secondary | ICD-10-CM | POA: Diagnosis not present

## 2018-01-31 DIAGNOSIS — Z1231 Encounter for screening mammogram for malignant neoplasm of breast: Secondary | ICD-10-CM

## 2018-02-01 ENCOUNTER — Other Ambulatory Visit: Payer: Self-pay | Admitting: Internal Medicine

## 2018-02-01 DIAGNOSIS — R920 Mammographic microcalcification found on diagnostic imaging of breast: Secondary | ICD-10-CM

## 2018-02-02 ENCOUNTER — Encounter: Payer: Self-pay | Admitting: Gastroenterology

## 2018-02-08 ENCOUNTER — Other Ambulatory Visit: Payer: Medicare Other

## 2018-02-08 ENCOUNTER — Other Ambulatory Visit: Payer: Self-pay | Admitting: Family Medicine

## 2018-02-23 ENCOUNTER — Ambulatory Visit: Admission: RE | Admit: 2018-02-23 | Payer: Medicare Other | Source: Ambulatory Visit

## 2018-02-23 ENCOUNTER — Ambulatory Visit
Admission: RE | Admit: 2018-02-23 | Discharge: 2018-02-23 | Disposition: A | Discharge status: Home / Self Care | Payer: Medicare Other | Source: Ambulatory Visit | Attending: Internal Medicine | Admitting: Internal Medicine

## 2018-02-23 DIAGNOSIS — R920 Mammographic microcalcification found on diagnostic imaging of breast: Secondary | ICD-10-CM

## 2018-02-23 DIAGNOSIS — R921 Mammographic calcification found on diagnostic imaging of breast: Secondary | ICD-10-CM | POA: Diagnosis not present

## 2018-03-14 ENCOUNTER — Ambulatory Visit: Payer: Medicare Other

## 2018-03-14 ENCOUNTER — Telehealth: Payer: Self-pay | Admitting: *Deleted

## 2018-03-14 ENCOUNTER — Encounter: Payer: Self-pay | Admitting: *Deleted

## 2018-03-14 NOTE — Telephone Encounter (Signed)
noted 

## 2018-03-14 NOTE — Telephone Encounter (Signed)
PATIENT WAS A NO SHOW AND LETTER SENT  °

## 2018-04-12 ENCOUNTER — Encounter: Payer: Self-pay | Admitting: Podiatry

## 2018-04-12 ENCOUNTER — Ambulatory Visit (INDEPENDENT_AMBULATORY_CARE_PROVIDER_SITE_OTHER): Payer: Medicare Other | Admitting: Podiatry

## 2018-04-12 DIAGNOSIS — M79609 Pain in unspecified limb: Secondary | ICD-10-CM

## 2018-04-12 DIAGNOSIS — E1142 Type 2 diabetes mellitus with diabetic polyneuropathy: Secondary | ICD-10-CM

## 2018-04-12 DIAGNOSIS — B351 Tinea unguium: Secondary | ICD-10-CM

## 2018-04-12 NOTE — Progress Notes (Signed)
Complaint:  Visit Type: Patient returns to my office for continued preventative foot care services. Complaint: Patient states" my nails have grown long and thick and become painful to walk and wear shoes" Patient has been diagnosed with DM with neuropathy.. The patient presents for preventative foot care services. No changes to ROS  Podiatric Exam: Vascular: dorsalis pedis and posterior tibial pulses are palpable bilateral. Capillary return is immediate. Temperature gradient is WNL. Skin turgor WNL  Sensorium: Diminished Semmes Weinstein monofilament test. Normal tactile sensation bilaterally. Nail Exam: Pt has thick disfigured discolored nails with subungual debris noted bilateral entire nail hallux through fifth toenails Ulcer Exam: There is no evidence of ulcer or pre-ulcerative changes or infection. Orthopedic Exam: Muscle tone and strength are WNL. No limitations in general ROM. No crepitus or effusions noted. Foot type and digits show no abnormalities. Bony prominences are unremarkable. Skin: No Porokeratosis. No infection or ulcers  Diagnosis:  Onychomycosis, , Pain in right toe, pain in left toes  Treatment & Plan Procedures and Treatment: Consent by patient was obtained for treatment procedures.   Debridement of mycotic and hypertrophic toenails, 1 through 5 bilateral and clearing of subungual debris. No ulceration, no infection noted.  Return Visit-Office Procedure: Patient instructed to return to the office for a follow up visit 3 months for continued evaluation and treatment.    Gardiner Barefoot DPM

## 2018-04-28 ENCOUNTER — Other Ambulatory Visit: Payer: Self-pay

## 2018-05-04 DIAGNOSIS — E1165 Type 2 diabetes mellitus with hyperglycemia: Secondary | ICD-10-CM | POA: Diagnosis not present

## 2018-05-04 DIAGNOSIS — Z0000 Encounter for general adult medical examination without abnormal findings: Secondary | ICD-10-CM | POA: Diagnosis not present

## 2018-05-04 DIAGNOSIS — I10 Essential (primary) hypertension: Secondary | ICD-10-CM | POA: Diagnosis not present

## 2018-05-04 DIAGNOSIS — E119 Type 2 diabetes mellitus without complications: Secondary | ICD-10-CM | POA: Diagnosis not present

## 2018-05-04 DIAGNOSIS — E785 Hyperlipidemia, unspecified: Secondary | ICD-10-CM | POA: Diagnosis not present

## 2018-05-04 DIAGNOSIS — N390 Urinary tract infection, site not specified: Secondary | ICD-10-CM | POA: Diagnosis not present

## 2018-05-04 DIAGNOSIS — R7989 Other specified abnormal findings of blood chemistry: Secondary | ICD-10-CM | POA: Diagnosis not present

## 2018-05-13 IMAGING — DX DG CHEST 2V
2 series · 2 of 2 positions shown · non-contrast
Comparison: 08/28/2013

CLINICAL DATA: Cough for 3 days.

EXAM:
CHEST  2 VIEW

[chest lat]
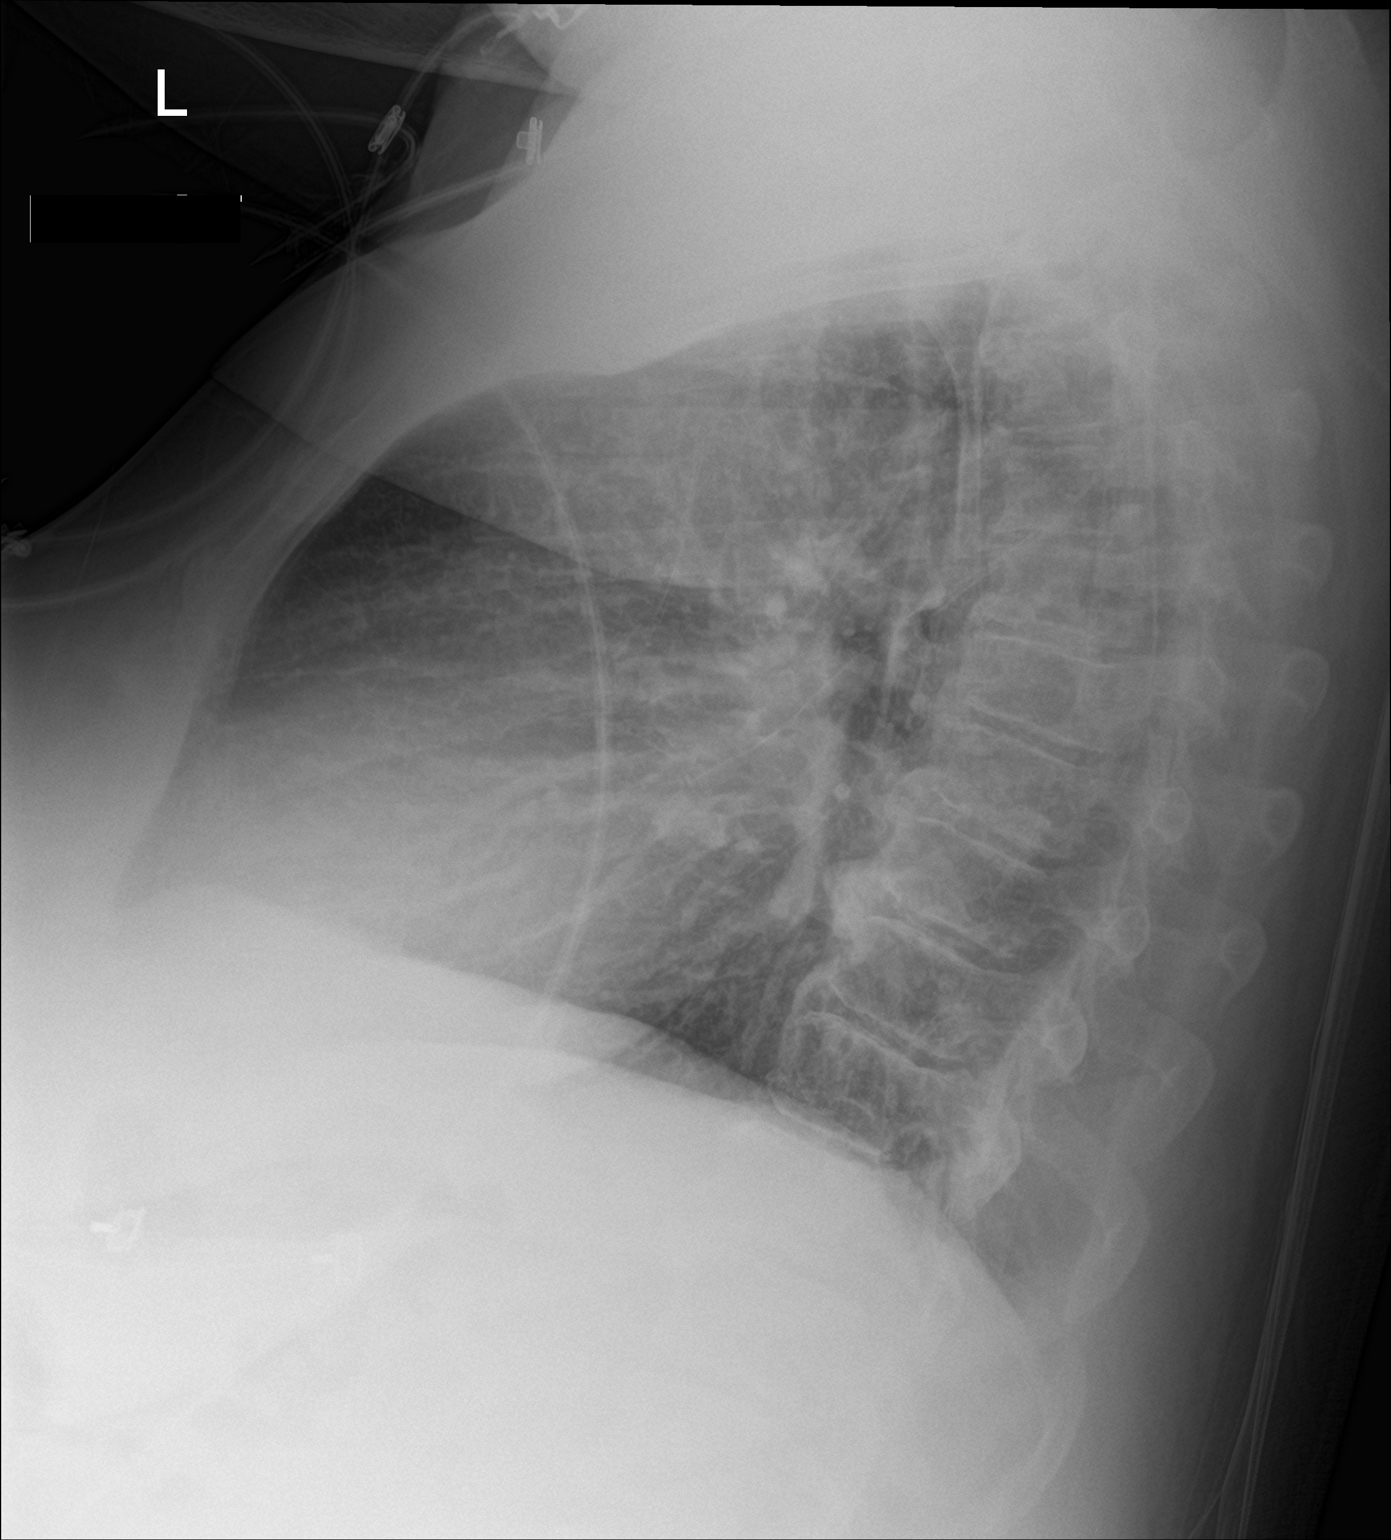

[chest ap]
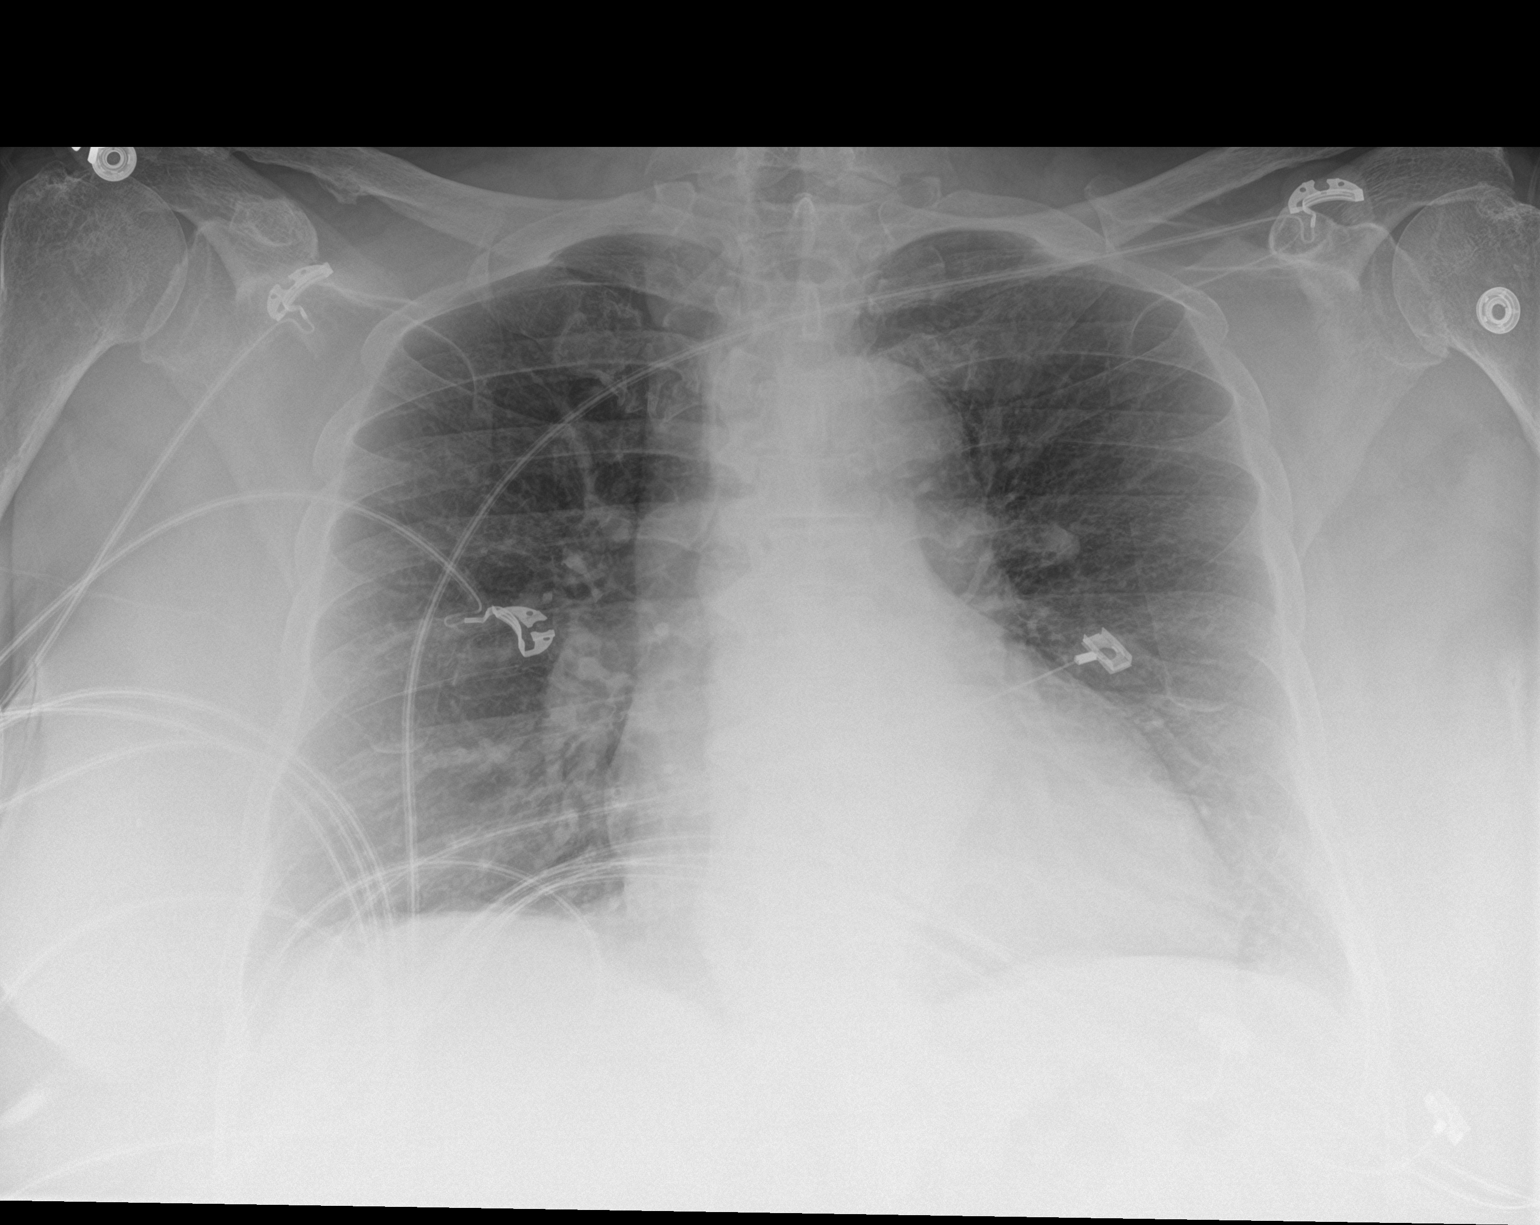

[2 of 2 positions shown; findings below may reference images not displayed]

FINDINGS: Cardiomediastinal silhouette is mildly enlarged. Mediastinal
contours appear intact. Tortuosity of the aorta noted.

There is no evidence of focal airspace consolidation, pleural
effusion or pneumothorax. Prominence of the interstitial markings.

Osseous structures are without acute abnormality. Soft tissues are
grossly normal.
IMPRESSION: Mildly enlarged cardiac silhouette.

Prominence of the interstitial markings which may be seen with
pulmonary vascular congestion.

No evidence of focal consolidation.

## 2018-06-26 ENCOUNTER — Ambulatory Visit (INDEPENDENT_AMBULATORY_CARE_PROVIDER_SITE_OTHER): Payer: Self-pay

## 2018-06-26 DIAGNOSIS — Z1211 Encounter for screening for malignant neoplasm of colon: Secondary | ICD-10-CM

## 2018-06-26 MED ORDER — PEG 3350-KCL-NA BICARB-NACL 420 G PO SOLR: 4000.0000 mL | ORAL | 0 refills | Status: DC

## 2018-06-26 NOTE — Progress Notes (Signed)
Gastroenterology Pre-Procedure Review  Request Date:06/26/18 Requesting Physician: Dr.Fanta no previous tcs  PATIENT REVIEW QUESTIONS: The patient responded to the following health history questions as indicated:    1. Diabetes Melitis: yes (tradjenta, novolog, and basaglar kwikpen) 2. Joint replacements in the past 12 months: no 3. Major health problems in the past 3 months: no 4. Has an artificial valve or MVP: no 5. Has a defibrillator: no 6. Has been advised in past to take antibiotics in advance of a procedure like teeth cleaning: no 7. Family history of colon cancer: no  8. Alcohol Use: no 9. History of sleep apnea: no  10. History of coronary artery or other vascular stents placed within the last 12 months: no 11. History of any prior anesthesia complications: no    MEDICATIONS & ALLERGIES:    Patient reports the following regarding taking any blood thinners:   Plavix? no Aspirin? yes (325mg ) Coumadin? no Brilinta? no Xarelto? no Eliquis? no Pradaxa? no Savaysa? no Effient? no  Patient confirms/reports the following medications:  Current Outpatient Medications  Medication Sig Dispense Refill  . albuterol (PROVENTIL HFA;VENTOLIN HFA) 108 (90 Base) MCG/ACT inhaler Inhale 1-2 puffs into the lungs every 6 (six) hours as needed for wheezing or shortness of breath. 1 Inhaler 0  . aspirin EC 325 MG tablet TAKE (1) TABLET BY MOUTH ONCE DAILY. 30 tablet 0  . carvedilol (COREG) 12.5 MG tablet TAKE (1) TABLET BY MOUTH TWICE DAILY. 60 tablet 0  . donepezil (ARICEPT) 10 MG tablet Take 10 mg by mouth at bedtime.    . hydrochlorothiazide (HYDRODIURIL) 25 MG tablet TAKE (1) TABLET BY MOUTH ONCE DAILY. 30 tablet 0  . INCRUSE ELLIPTA 62.5 MCG/INH AEPB INHALE 1 PUFF BY MOUTH ONCE DAILY. 30 each 0  . insulin aspart (NOVOLOG) 100 UNIT/ML injection Inject 10 Units into the skin 3 (three) times daily before meals.    . Insulin Glargine (BASAGLAR KWIKPEN) 100 UNIT/ML SOPN Inject 30 Units into  the skin at bedtime.    . Ipratropium-Albuterol (COMBIVENT RESPIMAT) 20-100 MCG/ACT AERS respimat Inhale 1 puff into the lungs every 6 (six) hours.    Marland Kitchen losartan (COZAAR) 100 MG tablet TAKE (1) TABLET BY MOUTH ONCE DAILY. 30 tablet 0  . PARoxetine (PAXIL) 20 MG tablet TAKE (1) TABLET BY MOUTH ONCE DAILY. 30 tablet 0  . simvastatin (ZOCOR) 20 MG tablet TAKE 1 TABLET BY MOUTH ONCE DAILY. 30 tablet 0  . therapeutic multivitamin-minerals (THERAGRAN-M) tablet One po qd 30 tablet 5  . UNABLE TO FIND 25 mg daily. Med Name: mybretriq    . UNABLE TO FIND 5 mg daily. Med Name: trajenta    . ACCU-CHEK AVIVA PLUS test strip CHECK BLOOD SUGAR ONCE DAILY. 50 each 2  . amLODipine (NORVASC) 5 MG tablet TAKE (1) TABLET BY MOUTH ONCE DAILY. 30 tablet 0  . Blood Glucose Monitoring Suppl (EASYMAX L BLOOD GLUCOSE) DEVI Check BS QD and PRN. E11.9 1 Device 1   Current Facility-Administered Medications  Medication Dose Route Frequency Provider Last Rate Last Dose  . betamethasone acetate-betamethasone sodium phosphate (CELESTONE) injection 3 mg  3 mg Intramuscular Once Edrick Kins, DPM        Patient confirms/reports the following allergies:  Allergies  Allergen Reactions  . Ace Inhibitors Cough  . Diovan [Valsartan] Cough    No orders of the defined types were placed in this encounter.   AUTHORIZATION INFORMATION Primary Insurance: medicare,  ID # [id number] Pre-Cert / Auth required: no  SCHEDULE INFORMATION: Procedure has been scheduled as follows:  Date: 08/22/18, Time: 10:30 Location: [facility] Dr.Rourk  This Gastroenterology Pre-Precedure Review Form is being routed to the following provider(s): Walden Field NP

## 2018-06-26 NOTE — Patient Instructions (Addendum)
Kathryn Cook   1950-10-27 MRN: [id number]    Procedure Date: 08/22/18 Time to register: 9:30am Place to register: [facility] Procedure Time: 10:30am Scheduled provider: R. Garfield Cornea, MD  PREPARATION FOR COLONOSCOPY WITH TRI-LYTE SPLIT PREP  Please notify us immediately if you are diabetic, take iron supplements, or if you are on Coumadin or any other blood thinners.   Please hold the following medications: I will mail you a letter with this information  You will need to purchase 1 fleet enema and 1 box of Bisacodyl 41m tablets.   2 DAYS BEFORE PROCEDURE:  DATE: 08/20/18   DAY: Sunday Begin clear liquid diet AFTER your lunch meal. NO SOLID FOODS after this point.  1 DAY BEFORE PROCEDURE:  DATE: 08/21/18   DAY: Monday Continue clear liquids the entire day - NO SOLID FOOD.   Diabetic medications adjustments for today: see letter  At 2:00 pm:  Take 2 Bisacodyl tablets.   At 4:00pm:  Start drinking your solution. Make sure you mix well per instructions on the bottle. Try to drink 1 (one) 8 ounce glass every 10-15 minutes until you have consumed HALF the jug. You should complete by 6:00pm.You must keep the left over solution refrigerated until completed next day.  Continue clear liquids. You must drink plenty of clear liquids to prevent dehyration and kidney failure.     DAY OF PROCEDURE:   DATE: 08/22/18   DAY: Tuesday If you take medications for your heart, blood pressure or breathing, you may take these medications.  Diabetic medications adjustments for today: see letter  Five hours before your procedure time @ 5:30am:  Finish remaining amout of bowel prep, drinking 1 (one) 8 ounce glass every 10-15 minutes until complete. You have two hours to consume remaining prep.   Three hours before your procedure time _0 :30am:  Nothing by mouth.   At least one hour before going to the hospital:  Give yourself one Fleet enema. You may take your morning medications  with sip of water unless we have instructed otherwise.      Please see below for Dietary Information.  CLEAR LIQUIDS INCLUDE:  Water Jello (NOT red in color)   Ice Popsicles (NOT red in color)   Tea (sugar ok, no milk/cream) Powdered fruit flavored drinks  Coffee (sugar ok, no milk/cream) Gatorade/ Lemonade/ Kool-Aid  (NOT red in color)   Juice: apple, white grape, white cranberry Soft drinks  Clear bullion, consomme, broth (fat free beef/chicken/vegetable)  Carbonated beverages (any kind)  Strained chicken noodle soup Hard Candy   Remember: Clear liquids are liquids that will allow you to see your fingers on the other side of a clear glass. Be sure liquids are NOT red in color, and not cloudy, but CLEAR.  DO NOT EAT OR DRINK ANY OF THE FOLLOWING:  Dairy products of any kind   Cranberry juice Tomato juice / V8 juice   Grapefruit juice Orange juice     Red grape juice  Do not eat any solid foods, including such foods as: cereal, oatmeal, yogurt, fruits, vegetables, creamed soups, eggs, bread, crackers, pureed foods in a blender, etc.   HELPFUL HINTS FOR DRINKING PREP SOLUTION:   Make sure prep is extremely cold. Mix and refrigerate the the morning of the prep. You may also put in the freezer.   You may try mixing some Crystal Light or Country Time Lemonade if you prefer. Mix in small amounts; add more if necessary.  Try drinking through  a straw  Rinse mouth with water or a mouthwash between glasses, to remove after-taste.  Try sipping on a cold beverage /ice/ popsicles between glasses of prep.  Place a piece of sugar-free hard candy in mouth between glasses.  If you become nauseated, try consuming smaller amounts, or stretch out the time between glasses. Stop for 30-60 minutes, then slowly start back drinking.        OTHER INSTRUCTIONS  You will need a responsible adult at least 66 years of age to accompany you and drive you home. This person must remain in the  waiting room during your procedure. The hospital will cancel your procedure if you do not have a responsible adult with you.   1. Wear loose fitting clothing that is easily removed. 2. Leave jewelry and other valuables at home.  3. Remove all body piercing jewelry and leave at home. 4. Total time from sign-in until discharge is approximately 2-3 hours. 5. You should go home directly after your procedure and rest. You can resume normal activities the day after your procedure. 6. The day of your procedure you should not:  Drive  Make legal decisions  Operate machinery  Drink alcohol  Return to work   You may call the office (Dept: [phone]) before 5:00pm, or page the doctor on call [phone]) after 5:00pm, for further instructions, if necessary.   Insurance Information YOU WILL NEED TO CHECK WITH YOUR INSURANCE COMPANY FOR THE BENEFITS OF COVERAGE YOU HAVE FOR THIS PROCEDURE.  UNFORTUNATELY, NOT ALL INSURANCE COMPANIES HAVE BENEFITS TO COVER ALL OR PART OF THESE TYPES OF PROCEDURES.  IT IS YOUR RESPONSIBILITY TO CHECK YOUR BENEFITS, HOWEVER, WE WILL BE GLAD TO ASSIST YOU WITH ANY CODES YOUR INSURANCE COMPANY MAY NEED.    PLEASE NOTE THAT MOST INSURANCE COMPANIES WILL NOT COVER A SCREENING COLONOSCOPY FOR PEOPLE UNDER THE AGE OF 50  IF YOU HAVE BCBS INSURANCE, YOU MAY HAVE BENEFITS FOR A SCREENING COLONOSCOPY BUT IF POLYPS ARE FOUND THE DIAGNOSIS WILL CHANGE AND THEN YOU MAY HAVE A DEDUCTIBLE THAT WILL NEED TO BE MET. SO PLEASE MAKE SURE YOU CHECK YOUR BENEFITS FOR A SCREENING COLONOSCOPY AS WELL AS A DIAGNOSTIC COLONOSCOPY.

## 2018-06-26 NOTE — Progress Notes (Signed)
Ok to schedule.  DM meds: half night before and none morning of.  On prep day: Check CBG ac and hs as well as if the patient feels like their blood sugar is off. Can use soda, juice (that's in [place]) as needed for any low blood sugar.  Check CBG on arrival to endo unit.

## 2018-07-03 NOTE — Progress Notes (Signed)
Letter mailed to the pt with DM medication adjustments.

## 2018-07-11 ENCOUNTER — Encounter: Payer: Self-pay | Admitting: Podiatry

## 2018-07-11 ENCOUNTER — Ambulatory Visit (INDEPENDENT_AMBULATORY_CARE_PROVIDER_SITE_OTHER): Payer: Medicare Other | Admitting: Podiatry

## 2018-07-11 DIAGNOSIS — B351 Tinea unguium: Secondary | ICD-10-CM

## 2018-07-11 DIAGNOSIS — M79676 Pain in unspecified toe(s): Secondary | ICD-10-CM | POA: Diagnosis not present

## 2018-07-11 DIAGNOSIS — M79609 Pain in unspecified limb: Principal | ICD-10-CM

## 2018-07-12 ENCOUNTER — Ambulatory Visit: Payer: Medicare Other | Admitting: Podiatry

## 2018-07-13 DIAGNOSIS — J42 Unspecified chronic bronchitis: Secondary | ICD-10-CM | POA: Diagnosis not present

## 2018-07-13 DIAGNOSIS — E1165 Type 2 diabetes mellitus with hyperglycemia: Secondary | ICD-10-CM | POA: Diagnosis not present

## 2018-07-13 DIAGNOSIS — I10 Essential (primary) hypertension: Secondary | ICD-10-CM | POA: Diagnosis not present

## 2018-07-13 DIAGNOSIS — F0390 Unspecified dementia without behavioral disturbance: Secondary | ICD-10-CM | POA: Diagnosis not present

## 2018-07-13 DIAGNOSIS — J069 Acute upper respiratory infection, unspecified: Secondary | ICD-10-CM | POA: Diagnosis not present

## 2018-07-13 DIAGNOSIS — N390 Urinary tract infection, site not specified: Secondary | ICD-10-CM | POA: Diagnosis not present

## 2018-07-13 DIAGNOSIS — R300 Dysuria: Secondary | ICD-10-CM | POA: Diagnosis not present

## 2018-07-13 DIAGNOSIS — Z23 Encounter for immunization: Secondary | ICD-10-CM | POA: Diagnosis not present

## 2018-07-18 DIAGNOSIS — E7800 Pure hypercholesterolemia, unspecified: Secondary | ICD-10-CM | POA: Diagnosis not present

## 2018-07-18 DIAGNOSIS — R2681 Unsteadiness on feet: Secondary | ICD-10-CM | POA: Diagnosis not present

## 2018-07-18 DIAGNOSIS — Z794 Long term (current) use of insulin: Secondary | ICD-10-CM | POA: Diagnosis not present

## 2018-07-18 DIAGNOSIS — F0280 Dementia in other diseases classified elsewhere without behavioral disturbance: Secondary | ICD-10-CM | POA: Diagnosis not present

## 2018-07-18 DIAGNOSIS — J069 Acute upper respiratory infection, unspecified: Secondary | ICD-10-CM | POA: Diagnosis not present

## 2018-07-18 DIAGNOSIS — E1165 Type 2 diabetes mellitus with hyperglycemia: Secondary | ICD-10-CM | POA: Diagnosis not present

## 2018-07-18 DIAGNOSIS — I10 Essential (primary) hypertension: Secondary | ICD-10-CM | POA: Diagnosis not present

## 2018-07-18 DIAGNOSIS — G301 Alzheimer's disease with late onset: Secondary | ICD-10-CM | POA: Diagnosis not present

## 2018-07-18 DIAGNOSIS — J42 Unspecified chronic bronchitis: Secondary | ICD-10-CM | POA: Diagnosis not present

## 2018-07-19 ENCOUNTER — Emergency Department (HOSPITAL_COMMUNITY): Payer: Medicare Other

## 2018-07-19 ENCOUNTER — Emergency Department (HOSPITAL_COMMUNITY)
Admission: EM | Admit: 2018-07-19 | Discharge: 2018-07-19 | Disposition: A | Discharge status: Home / Self Care | Payer: Medicare Other | Attending: Emergency Medicine | Admitting: Emergency Medicine

## 2018-07-19 ENCOUNTER — Encounter (HOSPITAL_COMMUNITY): Payer: Self-pay

## 2018-07-19 ENCOUNTER — Other Ambulatory Visit: Payer: Self-pay

## 2018-07-19 DIAGNOSIS — Z7982 Long term (current) use of aspirin: Secondary | ICD-10-CM | POA: Insufficient documentation

## 2018-07-19 DIAGNOSIS — Z8673 Personal history of transient ischemic attack (TIA), and cerebral infarction without residual deficits: Secondary | ICD-10-CM | POA: Insufficient documentation

## 2018-07-19 DIAGNOSIS — E11649 Type 2 diabetes mellitus with hypoglycemia without coma: Secondary | ICD-10-CM | POA: Insufficient documentation

## 2018-07-19 DIAGNOSIS — Z794 Long term (current) use of insulin: Secondary | ICD-10-CM | POA: Diagnosis not present

## 2018-07-19 DIAGNOSIS — Z79899 Other long term (current) drug therapy: Secondary | ICD-10-CM | POA: Diagnosis not present

## 2018-07-19 DIAGNOSIS — R609 Edema, unspecified: Secondary | ICD-10-CM | POA: Diagnosis not present

## 2018-07-19 DIAGNOSIS — I5032 Chronic diastolic (congestive) heart failure: Secondary | ICD-10-CM | POA: Insufficient documentation

## 2018-07-19 DIAGNOSIS — R001 Bradycardia, unspecified: Secondary | ICD-10-CM | POA: Diagnosis not present

## 2018-07-19 DIAGNOSIS — F0390 Unspecified dementia without behavioral disturbance: Secondary | ICD-10-CM | POA: Insufficient documentation

## 2018-07-19 DIAGNOSIS — I110 Hypertensive heart disease with heart failure: Secondary | ICD-10-CM | POA: Insufficient documentation

## 2018-07-19 DIAGNOSIS — E162 Hypoglycemia, unspecified: Secondary | ICD-10-CM

## 2018-07-19 DIAGNOSIS — E119 Type 2 diabetes mellitus without complications: Secondary | ICD-10-CM | POA: Diagnosis not present

## 2018-07-19 DIAGNOSIS — Z87891 Personal history of nicotine dependence: Secondary | ICD-10-CM | POA: Diagnosis not present

## 2018-07-19 DIAGNOSIS — R5383 Other fatigue: Secondary | ICD-10-CM | POA: Diagnosis not present

## 2018-07-19 DIAGNOSIS — J9811 Atelectasis: Secondary | ICD-10-CM | POA: Diagnosis not present

## 2018-07-19 DIAGNOSIS — E161 Other hypoglycemia: Secondary | ICD-10-CM | POA: Diagnosis not present

## 2018-07-19 LAB — CBG MONITORING, ED
GLUCOSE-CAPILLARY: 63 mg/dL — AB (ref 70–99)
GLUCOSE-CAPILLARY: 234 mg/dL — AB (ref 70–99)
Glucose-Capillary: 266 mg/dL — ABNORMAL HIGH (ref 70–99)

## 2018-07-19 LAB — COMPREHENSIVE METABOLIC PANEL
ALK PHOS: 88 U/L (ref 38–126)
ALT: 22 U/L (ref 0–44)
AST: 21 U/L (ref 15–41)
Albumin: 3.8 g/dL (ref 3.5–5.0)
Anion gap: 9 (ref 5–15)
BUN: 39 mg/dL — AB (ref 8–23)
CALCIUM: 9.3 mg/dL (ref 8.9–10.3)
CHLORIDE: 106 mmol/L (ref 98–111)
CO2: 27 mmol/L (ref 22–32)
CREATININE: 1.99 mg/dL — AB (ref 0.44–1.00)
GFR calc Af Amer: 29 mL/min — ABNORMAL LOW (ref >60)
GFR, EST NON AFRICAN AMERICAN: 25 mL/min — AB (ref >60)
Glucose, Bld: 68 mg/dL — ABNORMAL LOW (ref 70–99)
Potassium: 4.5 mmol/L (ref 3.5–5.1)
Sodium: 142 mmol/L (ref 135–145)
Total Bilirubin: 0.6 mg/dL (ref 0.3–1.2)
Total Protein: 7.7 g/dL (ref 6.5–8.1)

## 2018-07-19 LAB — CBC
HEMATOCRIT: 39.4 % (ref 36.0–46.0)
Hemoglobin: 11.4 g/dL — ABNORMAL LOW (ref 12.0–15.0)
MCH: 26.7 pg (ref 26.0–34.0)
MCHC: 28.9 g/dL — AB (ref 30.0–36.0)
MCV: 92.3 fL (ref 80.0–100.0)
Platelets: 223 10*3/uL (ref 150–400)
RBC: 4.27 MIL/uL (ref 3.87–5.11)
RDW: 13.5 % (ref 11.5–15.5)
WBC: 7.9 10*3/uL (ref 4.0–10.5)
nRBC: 0 % (ref 0.0–0.2)

## 2018-07-19 LAB — URINALYSIS, ROUTINE W REFLEX MICROSCOPIC
BACTERIA UA: NONE SEEN
Bilirubin Urine: NEGATIVE
Glucose, UA: NEGATIVE mg/dL
Hgb urine dipstick: NEGATIVE
Ketones, ur: NEGATIVE mg/dL
Leukocytes, UA: NEGATIVE
NITRITE: NEGATIVE
Protein, ur: 100 mg/dL — AB
SPECIFIC GRAVITY, URINE: 1.015 (ref 1.005–1.030)
pH: 5 (ref 5.0–8.0)

## 2018-07-19 MED ORDER — SODIUM CHLORIDE 0.9% FLUSH: 3.0000 mL | INTRAVENOUS | Status: DC | PRN

## 2018-07-19 MED ORDER — SODIUM CHLORIDE 0.9 % IV SOLN: 250.0000 mL | INTRAVENOUS | Status: DC | PRN

## 2018-07-19 MED ORDER — SODIUM CHLORIDE 0.9% FLUSH
3.0000 mL | Freq: Two times a day (BID) | INTRAVENOUS | Status: DC
  Administered 2018-07-19: 3 mL via INTRAVENOUS

## 2018-07-19 NOTE — ED Notes (Signed)
Kathryn Cook  [name]

## 2018-07-19 NOTE — ED Notes (Signed)
Pt currently using restroom

## 2018-07-19 NOTE — ED Triage Notes (Addendum)
EMS was called out due to hypoglycemia. CBG was 50 at facility and 15 with EMS after oral glucagon. Pt has no other complaints just states, "I feel bad."   BP 138/90 HR 57 (on amlodipine)

## 2018-07-19 NOTE — Progress Notes (Signed)
Subjective:   Patient ID: Memory Dance, female   DOB: 67 y.o.   MRN: [id number]   HPI Patient presents with elongated nailbeds 1-5 both feet that are thick and dystrophic and she cannot cut and make it difficult to wear shoes for her   ROS      Objective:  Physical Exam  Thick yellow brittle nailbeds 1-5 both feet that are incurvated in the corners and sore     Assessment:  Mycotic nail infection with pain 1-5 both feet     Plan:  Debride painful nailbeds 1-5 both feet with no iatrogenic bleeding noted and reappoint for routine care or earlier if needed

## 2018-07-19 NOTE — ED Provider Notes (Signed)
[facility] EMERGENCY DEPARTMENT Provider Note   CSN: [id number] Arrival date & time: 07/19/18  1201     History   Chief Complaint Chief Complaint  Patient presents with  . Hypoglycemia    HPI Kathryn Cook is a 67 y.o. female.  Pt presents to the ED today with low blood sugar.  Pt lives at a facility and told them that she felt bad.  They checked her blood sugar and it was 50.  They called EMS who gave pt oral glucagon and it came back up to 68.  The pt said she feels a little better now than when her blood sugar was 50.  The pt was not given any food at the facility.  The pt denies f/c.  No n/v.  No recent med changes.  No pain.     Past Medical History:  Diagnosis Date  . Bronchitis   . CVA (cerebral infarction)   . Diabetes mellitus without complication ([place])   . Hyperlipidemia   . Hypertension   . Memory loss   . Stroke ([place])   . Substance abuse ([place])    alcohol, but patient states she doesn't drink that much anymore    Patient Active Problem List   Diagnosis Date Noted  . Healthcare maintenance 03/02/2016  . Type II diabetes mellitus with neurological manifestations ([place]) 06/12/2015  . Moderate dementia without behavioral disturbance ([place]) 12/06/2013  . Dementia ([place]) 10/18/2013  . Tobacco abuse 10/18/2013  . HTN (hypertension), malignant 10/18/2013  . Diabetes mellitus ([place]) 10/01/2013  . Cough 08/27/2013  . Tobacco use disorder 08/27/2013  . H/O: CVA (cerebrovascular accident) 08/27/2013  . Vascular dementia ([place]) 08/27/2013  . Diastolic congestive heart failure, NYHA class 1 ([place]) 06/13/2013  . LVH (left ventricular hypertrophy) due to hypertensive disease 06/13/2013  . CVA (cerebral infarction) 06/10/2013  . History of alcohol use 05/31/2013  . HTN (hypertension) 05/31/2013  . Memory loss 05/31/2013  . Kidney disease, chronic, stage III (GFR 30-59 ml/min) ([place]) 05/31/2013    History reviewed. No pertinent surgical history.   OB History    Gravida  1   Para  1   Term  1   Preterm      AB      Living  1     SAB      TAB      Ectopic      Multiple      Live Births               Home Medications    Prior to Admission medications   Medication Sig Start Date End Date Taking? Authorizing Provider  ACCU-CHEK AVIVA PLUS test strip CHECK BLOOD SUGAR ONCE DAILY. 03/10/16   Wardell Honour, MD  albuterol (PROVENTIL HFA;VENTOLIN HFA) 108 (90 Base) MCG/ACT inhaler Inhale 1-2 puffs into the lungs every 6 (six) hours as needed for wheezing or shortness of breath. 02/28/17   Julianne Rice, MD  amLODipine (NORVASC) 5 MG tablet TAKE (1) TABLET BY MOUTH ONCE DAILY. 07/07/17   Timmothy Euler, MD  aspirin EC 325 MG tablet TAKE (1) TABLET BY MOUTH ONCE DAILY. 07/07/17   Timmothy Euler, MD  Blood Glucose Monitoring Suppl (EASYMAX L BLOOD GLUCOSE) DEVI Check BS QD and PRN. E11.9 05/18/16   Wardell Honour, MD  carvedilol (COREG) 12.5 MG tablet TAKE (1) TABLET BY MOUTH TWICE DAILY. 07/07/17   Timmothy Euler, MD  donepezil (ARICEPT) 10 MG tablet Take 10 mg by mouth  at bedtime.    [provider]  hydrochlorothiazide (HYDRODIURIL) 25 MG tablet TAKE (1) TABLET BY MOUTH ONCE DAILY. 07/07/17   Timmothy Euler, MD  INCRUSE ELLIPTA 62.5 MCG/INH AEPB INHALE 1 PUFF BY MOUTH ONCE DAILY. 08/02/17   Claretta Fraise, MD  insulin aspart (NOVOLOG) 100 UNIT/ML injection Inject 10 Units into the skin 3 (three) times daily before meals.    [provider]  Insulin Glargine (BASAGLAR KWIKPEN) 100 UNIT/ML SOPN Inject 30 Units into the skin at bedtime.    [provider]  Ipratropium-Albuterol (COMBIVENT RESPIMAT) 20-100 MCG/ACT AERS respimat Inhale 1 puff into the lungs every 6 (six) hours.    [provider]  linagliptin (TRADJENTA) 5 MG TABS tablet Take 5 mg by mouth daily.    [provider]  losartan (COZAAR) 100 MG tablet TAKE (1) TABLET BY MOUTH ONCE DAILY. 07/07/17   Timmothy Euler, MD  mirabegron ER (MYRBETRIQ) 25 MG TB24 tablet Take 25 mg by mouth daily.    [provider]  PARoxetine (PAXIL) 20 MG tablet TAKE (1) TABLET BY MOUTH ONCE DAILY. 07/07/17   Timmothy Euler, MD  polyethylene glycol-electrolytes (TRILYTE) 420 g solution Take 4,000 mLs by mouth as directed. 06/26/18   Carlis Stable, NP  simvastatin (ZOCOR) 20 MG tablet TAKE 1 TABLET BY MOUTH ONCE DAILY. 07/07/17   Timmothy Euler, MD  therapeutic multivitamin-minerals [facility]) tablet One po qd 02/02/17   Timmothy Euler, MD    Family History Family History  Problem Relation Age of Onset  . Other Mother        unknown  . Other Father        unknown    Social History Social History   Tobacco Use  . Smoking status: Former Smoker    Packs/day: 0.25    Years: 45.00    Pack years: 11.25    Types: Cigarettes    Last attempt to quit: 09/27/2013    Years since quitting: 4.8  . Smokeless tobacco: Never Used  Substance Use Topics  . Alcohol use: No    Comment: occasional  . Drug use: No    Types: Marijuana    Comment: last used 3 months ago     Allergies   Ace inhibitors and Diovan [valsartan]   Review of Systems Review of Systems  Constitutional: Positive for fatigue.  All other systems reviewed and are negative.    Physical Exam Updated Vital Signs BP 99/77   Pulse 61   Temp (!) 97.5 F (36.4 C) (Oral)   Resp 20   SpO2 100%   Physical Exam  Constitutional: She is oriented to person, place, and time. She appears well-developed and well-nourished.  HENT:  Head: Normocephalic and atraumatic.  Right Ear: External ear normal.  Left Ear: External ear normal.  Nose: Nose normal.  Mouth/Throat: Oropharynx is clear and moist.  Eyes: Pupils are equal, round, and reactive to light. Conjunctivae and EOM are normal.  Neck: Normal range of motion. Neck supple.  Cardiovascular: Normal rate, regular rhythm, normal heart sounds and intact distal pulses.  Pulmonary/Chest:  Effort normal and breath sounds normal.  Abdominal: Soft. Bowel sounds are normal.  Musculoskeletal: Normal range of motion.  Neurological: She is alert and oriented to person, place, and time.  Skin: Skin is warm. Capillary refill takes less than 2 seconds.  Psychiatric: She has a normal mood and affect. Her behavior is normal. Judgment and thought content normal.  Nursing note and  vitals reviewed.    ED Treatments / Results  Labs (all labs ordered are listed, but only abnormal results are displayed) Labs Reviewed  COMPREHENSIVE METABOLIC PANEL - Abnormal; Notable for the following components:      Result Value   Glucose, Bld 68 (*)    BUN 39 (*)    Creatinine, Ser 1.99 (*)    GFR calc non Af Amer 25 (*)    GFR calc Af Amer 29 (*)    All other components within normal limits  CBC - Abnormal; Notable for the following components:   Hemoglobin 11.4 (*)    MCHC 28.9 (*)    All other components within normal limits  URINALYSIS, ROUTINE W REFLEX MICROSCOPIC - Abnormal; Notable for the following components:   Protein, ur 100 (*)    All other components within normal limits  CBG MONITORING, ED - Abnormal; Notable for the following components:   Glucose-Capillary 63 (*)    All other components within normal limits  CBG MONITORING, ED - Abnormal; Notable for the following components:   Glucose-Capillary 234 (*)    All other components within normal limits  CBG MONITORING, ED - Abnormal; Notable for the following components:   Glucose-Capillary 266 (*)    All other components within normal limits    EKG EKG Interpretation  Date/Time:  Wednesday July 19 2018 12:38:41 EDT Ventricular Rate:  58 PR Interval:    QRS Duration: 89 QT Interval:  457 QTC Calculation: 449 R Axis:   -20 Text Interpretation:  Sinus rhythm Low voltage, precordial leads LVH by voltage Borderline T abnormalities, anterior leads No significant change since last tracing Confirmed by Isla Pence [phone])  on 07/19/2018 12:53:46 PM   Radiology Dg Chest 2 View  Result Date: 07/19/2018 CLINICAL DATA:  Hypoglycemia EXAM: CHEST - 2 VIEW COMPARISON:  02/28/2017 FINDINGS: Mild cardiomegaly with central vascular congestion. Minor basilar atelectasis. No focal pneumonia, collapse or consolidation. Negative for edema, effusion or pneumothorax. Trachea is midline. Aorta is atherosclerotic. Degenerative changes of the spine. IMPRESSION: Cardiomegaly with similar vascular congestion and basilar atelectasis. Electronically Signed   By: Jerilynn Mages.  Shick M.D.   On: 07/19/2018 14:20    Procedures Procedures (including critical care time)  Medications Ordered in ED Medications  sodium chloride flush (NS) 0.9 % injection 3 mL (3 mLs Intravenous Given 07/19/18 1222)  sodium chloride flush (NS) 0.9 % injection 3 mL (has no administration in time range)  0.9 %  sodium chloride infusion (has no administration in time range)     Initial Impression / Assessment and Plan / ED Course  I have reviewed the triage vital signs and the nursing notes.  Pertinent labs & imaging results that were available during my care of the patient were reviewed by me and considered in my medical decision making (see chart for details).    Pt is feeling much better.  She ate lunch and blood sugar has remained stable.  Pt has no evidence for infection.  No med changes for now.  Eat small, frequent meals.  Pt to f/u with pcp.  Return if worse.    Final Clinical Impressions(s) / ED Diagnoses   Final diagnoses:  Hypoglycemia    ED Discharge Orders    None       Isla Pence, MD 07/19/18 1531

## 2018-07-19 NOTE — ED Notes (Signed)
Pt currently eating a meal tray

## 2018-07-21 DIAGNOSIS — G301 Alzheimer's disease with late onset: Secondary | ICD-10-CM | POA: Diagnosis not present

## 2018-07-21 DIAGNOSIS — F0280 Dementia in other diseases classified elsewhere without behavioral disturbance: Secondary | ICD-10-CM | POA: Diagnosis not present

## 2018-07-21 DIAGNOSIS — E1165 Type 2 diabetes mellitus with hyperglycemia: Secondary | ICD-10-CM | POA: Diagnosis not present

## 2018-07-21 DIAGNOSIS — J069 Acute upper respiratory infection, unspecified: Secondary | ICD-10-CM | POA: Diagnosis not present

## 2018-07-21 DIAGNOSIS — J42 Unspecified chronic bronchitis: Secondary | ICD-10-CM | POA: Diagnosis not present

## 2018-07-21 DIAGNOSIS — R2681 Unsteadiness on feet: Secondary | ICD-10-CM | POA: Diagnosis not present

## 2018-07-25 DIAGNOSIS — E1165 Type 2 diabetes mellitus with hyperglycemia: Secondary | ICD-10-CM | POA: Diagnosis not present

## 2018-07-26 DIAGNOSIS — F0280 Dementia in other diseases classified elsewhere without behavioral disturbance: Secondary | ICD-10-CM | POA: Diagnosis not present

## 2018-07-26 DIAGNOSIS — R2681 Unsteadiness on feet: Secondary | ICD-10-CM | POA: Diagnosis not present

## 2018-07-26 DIAGNOSIS — E1165 Type 2 diabetes mellitus with hyperglycemia: Secondary | ICD-10-CM | POA: Diagnosis not present

## 2018-07-26 DIAGNOSIS — J069 Acute upper respiratory infection, unspecified: Secondary | ICD-10-CM | POA: Diagnosis not present

## 2018-07-26 DIAGNOSIS — J42 Unspecified chronic bronchitis: Secondary | ICD-10-CM | POA: Diagnosis not present

## 2018-07-26 DIAGNOSIS — G301 Alzheimer's disease with late onset: Secondary | ICD-10-CM | POA: Diagnosis not present

## 2018-07-27 DIAGNOSIS — G301 Alzheimer's disease with late onset: Secondary | ICD-10-CM | POA: Diagnosis not present

## 2018-07-27 DIAGNOSIS — J069 Acute upper respiratory infection, unspecified: Secondary | ICD-10-CM | POA: Diagnosis not present

## 2018-07-27 DIAGNOSIS — J42 Unspecified chronic bronchitis: Secondary | ICD-10-CM | POA: Diagnosis not present

## 2018-07-27 DIAGNOSIS — F0280 Dementia in other diseases classified elsewhere without behavioral disturbance: Secondary | ICD-10-CM | POA: Diagnosis not present

## 2018-07-27 DIAGNOSIS — E1165 Type 2 diabetes mellitus with hyperglycemia: Secondary | ICD-10-CM | POA: Diagnosis not present

## 2018-07-27 DIAGNOSIS — R2681 Unsteadiness on feet: Secondary | ICD-10-CM | POA: Diagnosis not present

## 2018-07-28 DIAGNOSIS — J42 Unspecified chronic bronchitis: Secondary | ICD-10-CM | POA: Diagnosis not present

## 2018-07-28 DIAGNOSIS — E1165 Type 2 diabetes mellitus with hyperglycemia: Secondary | ICD-10-CM | POA: Diagnosis not present

## 2018-07-28 DIAGNOSIS — R2681 Unsteadiness on feet: Secondary | ICD-10-CM | POA: Diagnosis not present

## 2018-07-28 DIAGNOSIS — G301 Alzheimer's disease with late onset: Secondary | ICD-10-CM | POA: Diagnosis not present

## 2018-07-28 DIAGNOSIS — J069 Acute upper respiratory infection, unspecified: Secondary | ICD-10-CM | POA: Diagnosis not present

## 2018-07-28 DIAGNOSIS — F0280 Dementia in other diseases classified elsewhere without behavioral disturbance: Secondary | ICD-10-CM | POA: Diagnosis not present

## 2018-07-31 DIAGNOSIS — R2681 Unsteadiness on feet: Secondary | ICD-10-CM | POA: Diagnosis not present

## 2018-07-31 DIAGNOSIS — J069 Acute upper respiratory infection, unspecified: Secondary | ICD-10-CM | POA: Diagnosis not present

## 2018-07-31 DIAGNOSIS — F0280 Dementia in other diseases classified elsewhere without behavioral disturbance: Secondary | ICD-10-CM | POA: Diagnosis not present

## 2018-07-31 DIAGNOSIS — G301 Alzheimer's disease with late onset: Secondary | ICD-10-CM | POA: Diagnosis not present

## 2018-07-31 DIAGNOSIS — J42 Unspecified chronic bronchitis: Secondary | ICD-10-CM | POA: Diagnosis not present

## 2018-07-31 DIAGNOSIS — E1165 Type 2 diabetes mellitus with hyperglycemia: Secondary | ICD-10-CM | POA: Diagnosis not present

## 2018-08-01 DIAGNOSIS — E1165 Type 2 diabetes mellitus with hyperglycemia: Secondary | ICD-10-CM | POA: Diagnosis not present

## 2018-08-01 DIAGNOSIS — R2681 Unsteadiness on feet: Secondary | ICD-10-CM | POA: Diagnosis not present

## 2018-08-01 DIAGNOSIS — G301 Alzheimer's disease with late onset: Secondary | ICD-10-CM | POA: Diagnosis not present

## 2018-08-01 DIAGNOSIS — J069 Acute upper respiratory infection, unspecified: Secondary | ICD-10-CM | POA: Diagnosis not present

## 2018-08-01 DIAGNOSIS — F0280 Dementia in other diseases classified elsewhere without behavioral disturbance: Secondary | ICD-10-CM | POA: Diagnosis not present

## 2018-08-01 DIAGNOSIS — J42 Unspecified chronic bronchitis: Secondary | ICD-10-CM | POA: Diagnosis not present

## 2018-08-03 DIAGNOSIS — R2681 Unsteadiness on feet: Secondary | ICD-10-CM | POA: Diagnosis not present

## 2018-08-03 DIAGNOSIS — F0280 Dementia in other diseases classified elsewhere without behavioral disturbance: Secondary | ICD-10-CM | POA: Diagnosis not present

## 2018-08-03 DIAGNOSIS — J069 Acute upper respiratory infection, unspecified: Secondary | ICD-10-CM | POA: Diagnosis not present

## 2018-08-03 DIAGNOSIS — E1165 Type 2 diabetes mellitus with hyperglycemia: Secondary | ICD-10-CM | POA: Diagnosis not present

## 2018-08-03 DIAGNOSIS — J42 Unspecified chronic bronchitis: Secondary | ICD-10-CM | POA: Diagnosis not present

## 2018-08-03 DIAGNOSIS — G301 Alzheimer's disease with late onset: Secondary | ICD-10-CM | POA: Diagnosis not present

## 2018-08-04 DIAGNOSIS — R2681 Unsteadiness on feet: Secondary | ICD-10-CM | POA: Diagnosis not present

## 2018-08-04 DIAGNOSIS — J42 Unspecified chronic bronchitis: Secondary | ICD-10-CM | POA: Diagnosis not present

## 2018-08-04 DIAGNOSIS — E1165 Type 2 diabetes mellitus with hyperglycemia: Secondary | ICD-10-CM | POA: Diagnosis not present

## 2018-08-04 DIAGNOSIS — G301 Alzheimer's disease with late onset: Secondary | ICD-10-CM | POA: Diagnosis not present

## 2018-08-04 DIAGNOSIS — J069 Acute upper respiratory infection, unspecified: Secondary | ICD-10-CM | POA: Diagnosis not present

## 2018-08-04 DIAGNOSIS — F0280 Dementia in other diseases classified elsewhere without behavioral disturbance: Secondary | ICD-10-CM | POA: Diagnosis not present

## 2018-08-07 DIAGNOSIS — G301 Alzheimer's disease with late onset: Secondary | ICD-10-CM | POA: Diagnosis not present

## 2018-08-07 DIAGNOSIS — J42 Unspecified chronic bronchitis: Secondary | ICD-10-CM | POA: Diagnosis not present

## 2018-08-07 DIAGNOSIS — F0280 Dementia in other diseases classified elsewhere without behavioral disturbance: Secondary | ICD-10-CM | POA: Diagnosis not present

## 2018-08-07 DIAGNOSIS — J069 Acute upper respiratory infection, unspecified: Secondary | ICD-10-CM | POA: Diagnosis not present

## 2018-08-07 DIAGNOSIS — E1165 Type 2 diabetes mellitus with hyperglycemia: Secondary | ICD-10-CM | POA: Diagnosis not present

## 2018-08-07 DIAGNOSIS — R2681 Unsteadiness on feet: Secondary | ICD-10-CM | POA: Diagnosis not present

## 2018-08-08 DIAGNOSIS — J42 Unspecified chronic bronchitis: Secondary | ICD-10-CM | POA: Diagnosis not present

## 2018-08-08 DIAGNOSIS — R2681 Unsteadiness on feet: Secondary | ICD-10-CM | POA: Diagnosis not present

## 2018-08-08 DIAGNOSIS — F0280 Dementia in other diseases classified elsewhere without behavioral disturbance: Secondary | ICD-10-CM | POA: Diagnosis not present

## 2018-08-08 DIAGNOSIS — G301 Alzheimer's disease with late onset: Secondary | ICD-10-CM | POA: Diagnosis not present

## 2018-08-08 DIAGNOSIS — J069 Acute upper respiratory infection, unspecified: Secondary | ICD-10-CM | POA: Diagnosis not present

## 2018-08-08 DIAGNOSIS — E1165 Type 2 diabetes mellitus with hyperglycemia: Secondary | ICD-10-CM | POA: Diagnosis not present

## 2018-08-10 DIAGNOSIS — R2681 Unsteadiness on feet: Secondary | ICD-10-CM | POA: Diagnosis not present

## 2018-08-10 DIAGNOSIS — E1165 Type 2 diabetes mellitus with hyperglycemia: Secondary | ICD-10-CM | POA: Diagnosis not present

## 2018-08-10 DIAGNOSIS — J42 Unspecified chronic bronchitis: Secondary | ICD-10-CM | POA: Diagnosis not present

## 2018-08-10 DIAGNOSIS — J069 Acute upper respiratory infection, unspecified: Secondary | ICD-10-CM | POA: Diagnosis not present

## 2018-08-10 DIAGNOSIS — F0280 Dementia in other diseases classified elsewhere without behavioral disturbance: Secondary | ICD-10-CM | POA: Diagnosis not present

## 2018-08-10 DIAGNOSIS — G301 Alzheimer's disease with late onset: Secondary | ICD-10-CM | POA: Diagnosis not present

## 2018-08-11 DIAGNOSIS — E1165 Type 2 diabetes mellitus with hyperglycemia: Secondary | ICD-10-CM | POA: Diagnosis not present

## 2018-08-11 DIAGNOSIS — J42 Unspecified chronic bronchitis: Secondary | ICD-10-CM | POA: Diagnosis not present

## 2018-08-11 DIAGNOSIS — R2681 Unsteadiness on feet: Secondary | ICD-10-CM | POA: Diagnosis not present

## 2018-08-11 DIAGNOSIS — J069 Acute upper respiratory infection, unspecified: Secondary | ICD-10-CM | POA: Diagnosis not present

## 2018-08-11 DIAGNOSIS — F0280 Dementia in other diseases classified elsewhere without behavioral disturbance: Secondary | ICD-10-CM | POA: Diagnosis not present

## 2018-08-11 DIAGNOSIS — G301 Alzheimer's disease with late onset: Secondary | ICD-10-CM | POA: Diagnosis not present

## 2018-08-14 DIAGNOSIS — R2681 Unsteadiness on feet: Secondary | ICD-10-CM | POA: Diagnosis not present

## 2018-08-14 DIAGNOSIS — J42 Unspecified chronic bronchitis: Secondary | ICD-10-CM | POA: Diagnosis not present

## 2018-08-14 DIAGNOSIS — E1165 Type 2 diabetes mellitus with hyperglycemia: Secondary | ICD-10-CM | POA: Diagnosis not present

## 2018-08-14 DIAGNOSIS — F0280 Dementia in other diseases classified elsewhere without behavioral disturbance: Secondary | ICD-10-CM | POA: Diagnosis not present

## 2018-08-14 DIAGNOSIS — J069 Acute upper respiratory infection, unspecified: Secondary | ICD-10-CM | POA: Diagnosis not present

## 2018-08-14 DIAGNOSIS — G301 Alzheimer's disease with late onset: Secondary | ICD-10-CM | POA: Diagnosis not present

## 2018-08-15 DIAGNOSIS — E1165 Type 2 diabetes mellitus with hyperglycemia: Secondary | ICD-10-CM | POA: Diagnosis not present

## 2018-08-15 DIAGNOSIS — N183 Chronic kidney disease, stage 3 (moderate): Secondary | ICD-10-CM | POA: Diagnosis not present

## 2018-08-15 DIAGNOSIS — R2681 Unsteadiness on feet: Secondary | ICD-10-CM | POA: Diagnosis not present

## 2018-08-15 DIAGNOSIS — J069 Acute upper respiratory infection, unspecified: Secondary | ICD-10-CM | POA: Diagnosis not present

## 2018-08-15 DIAGNOSIS — G301 Alzheimer's disease with late onset: Secondary | ICD-10-CM | POA: Diagnosis not present

## 2018-08-15 DIAGNOSIS — J42 Unspecified chronic bronchitis: Secondary | ICD-10-CM | POA: Diagnosis not present

## 2018-08-15 DIAGNOSIS — F0280 Dementia in other diseases classified elsewhere without behavioral disturbance: Secondary | ICD-10-CM | POA: Diagnosis not present

## 2018-08-16 ENCOUNTER — Ambulatory Visit (INDEPENDENT_AMBULATORY_CARE_PROVIDER_SITE_OTHER): Payer: Medicare Other | Admitting: Urology

## 2018-08-16 DIAGNOSIS — N3021 Other chronic cystitis with hematuria: Secondary | ICD-10-CM | POA: Diagnosis not present

## 2018-08-16 DIAGNOSIS — R3915 Urgency of urination: Secondary | ICD-10-CM | POA: Diagnosis not present

## 2018-08-17 DIAGNOSIS — R2681 Unsteadiness on feet: Secondary | ICD-10-CM | POA: Diagnosis not present

## 2018-08-17 DIAGNOSIS — J42 Unspecified chronic bronchitis: Secondary | ICD-10-CM | POA: Diagnosis not present

## 2018-08-17 DIAGNOSIS — G301 Alzheimer's disease with late onset: Secondary | ICD-10-CM | POA: Diagnosis not present

## 2018-08-17 DIAGNOSIS — J069 Acute upper respiratory infection, unspecified: Secondary | ICD-10-CM | POA: Diagnosis not present

## 2018-08-17 DIAGNOSIS — E1165 Type 2 diabetes mellitus with hyperglycemia: Secondary | ICD-10-CM | POA: Diagnosis not present

## 2018-08-17 DIAGNOSIS — F0280 Dementia in other diseases classified elsewhere without behavioral disturbance: Secondary | ICD-10-CM | POA: Diagnosis not present

## 2018-08-18 DIAGNOSIS — J42 Unspecified chronic bronchitis: Secondary | ICD-10-CM | POA: Diagnosis not present

## 2018-08-18 DIAGNOSIS — J069 Acute upper respiratory infection, unspecified: Secondary | ICD-10-CM | POA: Diagnosis not present

## 2018-08-18 DIAGNOSIS — F0280 Dementia in other diseases classified elsewhere without behavioral disturbance: Secondary | ICD-10-CM | POA: Diagnosis not present

## 2018-08-18 DIAGNOSIS — R2681 Unsteadiness on feet: Secondary | ICD-10-CM | POA: Diagnosis not present

## 2018-08-18 DIAGNOSIS — E1165 Type 2 diabetes mellitus with hyperglycemia: Secondary | ICD-10-CM | POA: Diagnosis not present

## 2018-08-18 DIAGNOSIS — G301 Alzheimer's disease with late onset: Secondary | ICD-10-CM | POA: Diagnosis not present

## 2018-08-21 DIAGNOSIS — E1165 Type 2 diabetes mellitus with hyperglycemia: Secondary | ICD-10-CM | POA: Diagnosis not present

## 2018-08-21 DIAGNOSIS — J069 Acute upper respiratory infection, unspecified: Secondary | ICD-10-CM | POA: Diagnosis not present

## 2018-08-21 DIAGNOSIS — R2681 Unsteadiness on feet: Secondary | ICD-10-CM | POA: Diagnosis not present

## 2018-08-21 DIAGNOSIS — J42 Unspecified chronic bronchitis: Secondary | ICD-10-CM | POA: Diagnosis not present

## 2018-08-21 DIAGNOSIS — F0280 Dementia in other diseases classified elsewhere without behavioral disturbance: Secondary | ICD-10-CM | POA: Diagnosis not present

## 2018-08-21 DIAGNOSIS — G301 Alzheimer's disease with late onset: Secondary | ICD-10-CM | POA: Diagnosis not present

## 2018-08-22 ENCOUNTER — Other Ambulatory Visit: Payer: Self-pay

## 2018-08-22 ENCOUNTER — Encounter (HOSPITAL_COMMUNITY)
Admission: RE | Disposition: A | Discharge status: Home / Self Care | Payer: Self-pay | Source: Ambulatory Visit | Attending: Internal Medicine

## 2018-08-22 ENCOUNTER — Encounter (HOSPITAL_COMMUNITY): Payer: Self-pay | Admitting: *Deleted

## 2018-08-22 ENCOUNTER — Ambulatory Visit (HOSPITAL_COMMUNITY)
Admission: RE | Admit: 2018-08-22 | Discharge: 2018-08-22 | Disposition: A | Discharge status: Home / Self Care | Payer: Medicare Other | Source: Ambulatory Visit | Attending: Internal Medicine | Admitting: Internal Medicine

## 2018-08-22 DIAGNOSIS — I10 Essential (primary) hypertension: Secondary | ICD-10-CM | POA: Insufficient documentation

## 2018-08-22 DIAGNOSIS — Z79899 Other long term (current) drug therapy: Secondary | ICD-10-CM | POA: Diagnosis not present

## 2018-08-22 DIAGNOSIS — Z87891 Personal history of nicotine dependence: Secondary | ICD-10-CM | POA: Diagnosis not present

## 2018-08-22 DIAGNOSIS — R413 Other amnesia: Secondary | ICD-10-CM | POA: Insufficient documentation

## 2018-08-22 DIAGNOSIS — E785 Hyperlipidemia, unspecified: Secondary | ICD-10-CM | POA: Insufficient documentation

## 2018-08-22 DIAGNOSIS — J42 Unspecified chronic bronchitis: Secondary | ICD-10-CM | POA: Diagnosis not present

## 2018-08-22 DIAGNOSIS — J069 Acute upper respiratory infection, unspecified: Secondary | ICD-10-CM | POA: Diagnosis not present

## 2018-08-22 DIAGNOSIS — E119 Type 2 diabetes mellitus without complications: Secondary | ICD-10-CM | POA: Insufficient documentation

## 2018-08-22 DIAGNOSIS — Z794 Long term (current) use of insulin: Secondary | ICD-10-CM | POA: Insufficient documentation

## 2018-08-22 DIAGNOSIS — Z7982 Long term (current) use of aspirin: Secondary | ICD-10-CM | POA: Diagnosis not present

## 2018-08-22 DIAGNOSIS — Z8673 Personal history of transient ischemic attack (TIA), and cerebral infarction without residual deficits: Secondary | ICD-10-CM | POA: Insufficient documentation

## 2018-08-22 DIAGNOSIS — D124 Benign neoplasm of descending colon: Secondary | ICD-10-CM | POA: Insufficient documentation

## 2018-08-22 DIAGNOSIS — F0280 Dementia in other diseases classified elsewhere without behavioral disturbance: Secondary | ICD-10-CM | POA: Diagnosis not present

## 2018-08-22 DIAGNOSIS — E1165 Type 2 diabetes mellitus with hyperglycemia: Secondary | ICD-10-CM | POA: Diagnosis not present

## 2018-08-22 DIAGNOSIS — Z1211 Encounter for screening for malignant neoplasm of colon: Secondary | ICD-10-CM | POA: Diagnosis not present

## 2018-08-22 DIAGNOSIS — D120 Benign neoplasm of cecum: Secondary | ICD-10-CM

## 2018-08-22 DIAGNOSIS — G301 Alzheimer's disease with late onset: Secondary | ICD-10-CM | POA: Diagnosis not present

## 2018-08-22 DIAGNOSIS — R2681 Unsteadiness on feet: Secondary | ICD-10-CM | POA: Diagnosis not present

## 2018-08-22 HISTORY — PX: POLYPECTOMY: SHX5525

## 2018-08-22 HISTORY — PX: COLONOSCOPY: SHX5424

## 2018-08-22 LAB — GLUCOSE, CAPILLARY: GLUCOSE-CAPILLARY: 175 mg/dL — AB (ref 70–99)

## 2018-08-22 SURGERY — COLONOSCOPY
Anesthesia: Moderate Sedation

## 2018-08-22 MED ORDER — MIDAZOLAM HCL 5 MG/5ML IJ SOLN
INTRAMUSCULAR | Status: AC
  Filled 2018-08-22: qty 10

## 2018-08-22 MED ORDER — STERILE WATER FOR IRRIGATION IR SOLN
Status: DC | PRN
  Administered 2018-08-22: 10:00:00

## 2018-08-22 MED ORDER — SODIUM CHLORIDE 0.9 % IV SOLN
INTRAVENOUS | Status: DC
  Administered 2018-08-22: 10:00:00 via INTRAVENOUS

## 2018-08-22 MED ORDER — ONDANSETRON HCL 4 MG/2ML IJ SOLN
INTRAMUSCULAR | Status: AC
  Filled 2018-08-22: qty 2

## 2018-08-22 MED ORDER — MIDAZOLAM HCL 5 MG/5ML IJ SOLN
INTRAMUSCULAR | Status: DC | PRN
  Administered 2018-08-22 (×2): 1 mg via INTRAVENOUS

## 2018-08-22 MED ORDER — MEPERIDINE HCL 100 MG/ML IJ SOLN
INTRAMUSCULAR | Status: DC | PRN
  Administered 2018-08-22: 25 mg via INTRAVENOUS

## 2018-08-22 MED ORDER — ONDANSETRON HCL 4 MG/2ML IJ SOLN
INTRAMUSCULAR | Status: DC | PRN
  Administered 2018-08-22: 4 mg via INTRAVENOUS

## 2018-08-22 MED ORDER — MEPERIDINE HCL 50 MG/ML IJ SOLN
INTRAMUSCULAR | Status: AC
  Filled 2018-08-22: qty 1

## 2018-08-22 NOTE — H&P (Signed)
@LOGO @   Primary Care Physician:  [name], MD Primary Gastroenterologist:  Dr. Gala Romney Pre-Procedure History & Physical: HPI:  Kathryn Cook is a 67 y.o. female is here for a screening colonoscopy.  First ever average her screening examination.  Past Medical History:  Diagnosis Date  . Bronchitis   . CVA (cerebral infarction)   . Diabetes mellitus without complication ([place])   . Hyperlipidemia   . Hypertension   . Memory loss   . Stroke ([place])   . Substance abuse ([place])    alcohol, but patient states she doesn't drink that much anymore    History reviewed. No pertinent surgical history.  Prior to Admission medications   Medication Sig Start Date End Date Taking? Authorizing Provider  albuterol (PROVENTIL HFA;VENTOLIN HFA) 108 (90 Base) MCG/ACT inhaler Inhale 1-2 puffs into the lungs every 6 (six) hours as needed for wheezing or shortness of breath. 02/28/17  Yes Julianne Rice, MD  amLODipine (NORVASC) 5 MG tablet TAKE (1) TABLET BY MOUTH ONCE DAILY. Patient taking differently: Take 5 mg by mouth daily at 8 pm. (2000) 07/07/17  Yes Timmothy Euler, MD  aspirin EC 325 MG tablet TAKE (1) TABLET BY MOUTH ONCE DAILY. Patient taking differently: Take 325 mg by mouth daily. (0800) 07/07/17  Yes Timmothy Euler, MD  carvedilol (COREG) 12.5 MG tablet TAKE (1) TABLET BY MOUTH TWICE DAILY. Patient taking differently: Take 12.5 mg by mouth 2 (two) times daily. (0800 & 1700) 07/07/17  Yes Timmothy Euler, MD  donepezil (ARICEPT) 10 MG tablet Take 10 mg by mouth at bedtime. (2000)   Yes [provider]  hydrochlorothiazide (HYDRODIURIL) 25 MG tablet TAKE (1) TABLET BY MOUTH ONCE DAILY. Patient taking differently: Take 25 mg by mouth daily. (0800) 07/07/17  Yes Timmothy Euler, MD  INCRUSE ELLIPTA 62.5 MCG/INH AEPB INHALE 1 PUFF BY MOUTH ONCE DAILY. Patient taking differently: Inhale 1 puff into the lungs daily. (0800) 08/02/17  Yes Stacks, Cletus Gash, MD  insulin  aspart (NOVOLOG) 100 UNIT/ML injection Inject 10 Units into the skin 3 (three) times daily before meals. Hold for blood sugar lower than 100.   Yes [provider]  Insulin Glargine (BASAGLAR KWIKPEN) 100 UNIT/ML SOPN Inject 30 Units into the skin at bedtime. Hold for blood sugar lower than 70.   Yes [provider]  Ipratropium-Albuterol (COMBIVENT RESPIMAT) 20-100 MCG/ACT AERS respimat Inhale 1 puff into the lungs every 6 (six) hours as needed (cough/wheezing.).    Yes [provider]  linagliptin (TRADJENTA) 5 MG TABS tablet Take 5 mg by mouth daily. (0800)   Yes [provider]  losartan (COZAAR) 100 MG tablet TAKE (1) TABLET BY MOUTH ONCE DAILY. Patient taking differently: Take 100 mg by mouth daily. (0800) 07/07/17  Yes Timmothy Euler, MD  mirabegron ER (MYRBETRIQ) 25 MG TB24 tablet Take 25 mg by mouth daily. (0800)   Yes [provider]  PARoxetine (PAXIL) 20 MG tablet TAKE (1) TABLET BY MOUTH ONCE DAILY. Patient taking differently: Take 20 mg by mouth daily. (0800) 07/07/17  Yes Timmothy Euler, MD  Polyethyl Glycol-Propyl Glycol (SYSTANE) 0.4-0.3 % SOLN Place 1 drop into both eyes 3 (three) times daily. (0800, 1400, 2000)   Yes [provider]  polyethylene glycol-electrolytes (TRILYTE) 420 g solution Take 4,000 mLs by mouth as directed. 06/26/18  Yes Carlis Stable, NP  simvastatin (ZOCOR) 20 MG tablet TAKE 1 TABLET BY MOUTH ONCE DAILY. Patient taking differently: Take 20 mg by mouth  daily at 6 PM.  07/07/17  Yes Timmothy Euler, MD  therapeutic multivitamin-minerals [facility]) tablet One po qd Patient taking differently: Take 1 tablet by mouth daily. (0800) 02/02/17  Yes Timmothy Euler, MD  ACCU-CHEK AVIVA PLUS test strip CHECK BLOOD SUGAR ONCE DAILY. 03/10/16   Wardell Honour, MD  Blood Glucose Monitoring Suppl [facility] L BLOOD GLUCOSE) DEVI Check BS QD and PRN. E11.9 05/18/16   Wardell Honour, MD   dextromethorphan-guaiFENesin (TUSSIN COUGH DM SUGAR FREE) 10-100 MG/5ML liquid Take 5-10 mLs by mouth every 6 (six) hours as needed (cough/congestion.).    [provider]    Allergies as of 06/26/2018 - Review Complete 06/26/2018  Allergen Reaction Noted  . Ace inhibitors Cough 10/01/2013  . Diovan [valsartan] Cough 10/18/2013    Family History  Problem Relation Age of Onset  . Other Mother        unknown  . Other Father        unknown    Social History   Socioeconomic History  . Marital status: Single    Spouse name: Not on file  . Number of children: 1  . Years of education: 38  . Highest education level: Not on file  Occupational History  . Occupation: unemployed  Social Needs  . Financial resource strain: Not on file  . Food insecurity:    Worry: Not on file    Inability: Not on file  . Transportation needs:    Medical: Not on file    Non-medical: Not on file  Tobacco Use  . Smoking status: Former Smoker    Packs/day: 0.25    Years: 45.00    Pack years: 11.25    Types: Cigarettes    Last attempt to quit: 09/27/2013    Years since quitting: 4.9  . Smokeless tobacco: Never Used  Substance and Sexual Activity  . Alcohol use: No    Comment: occasional  . Drug use: No    Types: Marijuana    Comment: last used 3 months ago  . Sexual activity: Yes  Lifestyle  . Physical activity:    Days per week: Not on file    Minutes per session: Not on file  . Stress: Not on file  Relationships  . Social connections:    Talks on phone: Not on file    Gets together: Not on file    Attends religious service: Not on file    Active member of club or organization: Not on file    Attends meetings of clubs or organizations: Not on file    Relationship status: Not on file  . Intimate partner violence:    Fear of current or ex partner: Not on file    Emotionally abused: Not on file    Physically abused: Not on file    Forced sexual activity: Not on file  Other  Topics Concern  . Not on file  Social History Narrative   Patient is single and lives at [facility] home.   Patient has one child.   Patient is right-handed.   Patient drinks two cups of coffee daily.   Patient has an 11 th grade education.    Review of Systems: See HPI, otherwise negative ROS  Physical Exam: BP 119/69   Pulse 68   Temp 98.5 F (36.9 C) (Oral)   Resp 11   Ht 5' 3.5" (1.613 m)   Wt 116.6 kg   SpO2 92%   BMI 44.81 kg/m  General:   Alert,  Well-developed, well-nourished, pleasant and cooperative in NAD Lungs:  Clear throughout to auscultation.   No wheezes, crackles, or rhonchi. No acute distress. Heart:  Regular rate and rhythm; no murmurs, clicks, rubs,  or gallops. Abdomen:  Soft, nontender and nondistended. No masses, hepatosplenomegaly or hernias noted. Normal bowel sounds, without guarding, and without rebound.   Impression/Plan: Kathryn Cook is now here to undergo a screening colonoscopy. First ever average risk screening examination.       Risks, benefits, limitations, imponderables and alternatives regarding colonoscopy have been reviewed with the patient. Questions have been answered. All parties agreeable.     Notice:  This dictation was prepared with Dragon dictation along with smaller phrase technology. Any transcriptional errors that result from this process are unintentional and may not be corrected upon review.

## 2018-08-22 NOTE — Discharge Instructions (Signed)
°Colonoscopy °Discharge Instructions ° °Read the instructions outlined below and refer to this sheet in the next few weeks. These discharge instructions provide you with general information on caring for yourself after you leave the hospital. Your doctor may also give you specific instructions. While your treatment has been planned according to the most current medical practices available, unavoidable complications occasionally occur. If you have any problems or questions after discharge, call Dr. Rourk at [phone]. °ACTIVITY °· You may resume your regular activity, but move at a slower pace for the next 24 hours.  °· Take frequent rest periods for the next 24 hours.  °· Walking will help get rid of the air and reduce the bloated feeling in your belly (abdomen).  °· No driving for 24 hours (because of the medicine (anesthesia) used during the test).   °· Do not sign any important legal documents or operate any machinery for 24 hours (because of the anesthesia used during the test).  °NUTRITION °· Drink plenty of fluids.  °· You may resume your normal diet as instructed by your doctor.  °· Begin with a light meal and progress to your normal diet. Heavy or fried foods are harder to digest and may make you feel sick to your stomach (nauseated).  °· Avoid alcoholic beverages for 24 hours or as instructed.  °MEDICATIONS °· You may resume your normal medications unless your doctor tells you otherwise.  °WHAT YOU CAN EXPECT TODAY °· Some feelings of bloating in the abdomen.  °· Passage of more gas than usual.  °· Spotting of blood in your stool or on the toilet paper.  °IF YOU HAD POLYPS REMOVED DURING THE COLONOSCOPY: °· No aspirin products for 7 days or as instructed.  °· No alcohol for 7 days or as instructed.  °· Eat a soft diet for the next 24 hours.  °FINDING OUT THE RESULTS OF YOUR TEST °Not all test results are available during your visit. If your test results are not back during the visit, make an appointment  with your caregiver to find out the results. Do not assume everything is normal if you have not heard from your caregiver or the medical facility. It is important for you to follow up on all of your test results.  °SEEK IMMEDIATE MEDICAL ATTENTION IF: °· You have more than a spotting of blood in your stool.  °· Your belly is swollen (abdominal distention).  °· You are nauseated or vomiting.  °· You have a temperature over 101.  °· You have abdominal pain or discomfort that is severe or gets worse throughout the day.  ° ° °Colon polyp information provided ° °Further recommendations to follow pending review of pathology report. ° ° °Colon Polyps °Polyps are tissue growths inside the body. Polyps can grow in many places, including the large intestine (colon). A polyp may be a round bump or a mushroom-shaped growth. You could have one polyp or several. °Most colon polyps are noncancerous (benign). However, some colon polyps can become cancerous over time. °What are the causes? °The exact cause of colon polyps is not known. °What increases the risk? °This condition is more likely to develop in people who: °· Have a family history of colon cancer or colon polyps. °· Are older than 50 or older than 45 if they are African American. °· Have inflammatory bowel disease, such as ulcerative colitis or Crohn disease. °· Are overweight. °· Smoke cigarettes. °· Do not get enough exercise. °· Drink too much alcohol. °·   Eat a diet that is: °? High in fat and red meat. °? Low in fiber. °· Had childhood cancer that was treated with abdominal radiation. ° °What are the signs or symptoms? °Most polyps do not cause symptoms. If you have symptoms, they may include: °· Blood coming from your rectum when having a bowel movement. °· Blood in your stool. The stool may look dark red or black. °· A change in bowel habits, such as constipation or diarrhea. ° °How is this diagnosed? °This condition is diagnosed with a colonoscopy. This is a  procedure that uses a lighted, flexible scope to look at the inside of your colon. °How is this treated? °Treatment for this condition involves removing any polyps that are found. Those polyps will then be tested for cancer. If cancer is found, your health care provider will talk to you about options for colon cancer treatment. °Follow these instructions at home: °Diet °· Eat plenty of fiber, such as fruits, vegetables, and whole grains. °· Eat foods that are high in calcium and vitamin D, such as milk, cheese, yogurt, eggs, liver, fish, and broccoli. °· Limit foods high in fat, red meats, and processed meats, such as hot dogs, sausage, bacon, and lunch meats. °· Maintain a healthy weight, or lose weight if recommended by your health care provider. °General instructions °· Do not smoke cigarettes. °· Do not drink alcohol excessively. °· Keep all follow-up visits as told by your health care provider. This is important. This includes keeping regularly scheduled colonoscopies. Talk to your health care provider about when you need a colonoscopy. °· Exercise every day or as told by your health care provider. °Contact a health care provider if: °· You have new or worsening bleeding during a bowel movement. °· You have new or increased blood in your stool. °· You have a change in bowel habits. °· You unexpectedly lose weight. °This information is not intended to replace advice given to you by your health care provider. Make sure you discuss any questions you have with your health care provider. °Document Released: 06/09/2004 Document Revised: 02/19/2016 Document Reviewed: 08/04/2015 °Elsevier Interactive Patient Education © 2018 [employer] ° °

## 2018-08-22 NOTE — Op Note (Signed)
[facility] Patient Name: Kathryn Cook Procedure Date: 08/22/2018 10:10 AM MRN: [id number] Date of Birth: [birth year] Attending MD: Norvel Richards , MD CSN: [id number] Age: 67 Admit Type: Outpatient Procedure:                Colonoscopy Indications:              Screening for colorectal malignant neoplasm Providers:                Norvel Richards, MD, Jeanann Lewandowsky. Gwenlyn Perking RN, RN,                            Nelma Rothman, Technician Referring MD:              Medicines:                Midazolam 2 mg IV, Meperidine 25 mg IV, Ondansetron                            4 mg IV Complications:            No immediate complications. Estimated Blood Loss:     Estimated blood loss was minimal. Procedure:                Pre-Anesthesia Assessment:                           - Prior to the procedure, a History and Physical                            was performed, and patient medications and                            allergies were reviewed. The patient's tolerance of                            previous anesthesia was also reviewed. The risks                            and benefits of the procedure and the sedation                            options and risks were discussed with the patient.                            All questions were answered, and informed consent                            was obtained. Prior Anticoagulants: The patient has                            taken no previous anticoagulant or antiplatelet                            agents. ASA Grade Assessment: II - A patient with  mild systemic disease. After reviewing the risks                            and benefits, the patient was deemed in                            satisfactory condition to undergo the procedure.                           After obtaining informed consent, the colonoscope                            was passed under direct vision. Throughout the   procedure, the patient's blood pressure, pulse, and                            oxygen saturations were monitored continuously. The                            [id number]-[id number] ([id number]) scope was introduced through                            the anus and advanced to the the cecum, identified                            by appendiceal orifice and ileocecal valve. The                            colonoscopy was performed without difficulty. The                            patient tolerated the procedure well. The quality                            of the bowel preparation was adequate. The                            ileocecal valve, appendiceal orifice, and rectum                            were photographed. The entire colon was well                            visualized. Scope In: 10:10:59 AM Scope Out: 10:26:30 AM Scope Withdrawal Time: 0 hours 12 minutes 41 seconds  Total Procedure Duration: 0 hours 15 minutes 31 seconds  Findings:      The perianal and digital rectal examinations were normal.      Two sessile polyps were found in the descending colon and ileocecal       valve. The polyps were 2 to 3 mm in size. These polyps were removed with       a cold biopsy forceps. Resection and retrieval were complete. Estimated       blood loss was minimal.      The exam was otherwise without abnormality on direct and  retroflexion       views. Impression:               - Two 4 to 5 mm polyps in the descending colon and                            at the ileocecal valve, removed with a cold biopsy                            forceps. Resected and retrieved.                           - The examination was otherwise normal on direct                            and retroflexion views. Moderate Sedation:      Moderate (conscious) sedation was administered by the endoscopy nurse       and supervised by the endoscopist. The following parameters were       monitored: oxygen saturation, heart rate, blood pressure,  respiratory       rate, EKG, adequacy of pulmonary ventilation, and response to care.       Total physician intraservice time was 18 minutes. Recommendation:           - Patient has a contact number available for                            emergencies. The signs and symptoms of potential                            delayed complications were discussed with the                            patient. Return to normal activities tomorrow.                            Written discharge instructions were provided to the                            patient.                           - Resume previous diet.                           - Continue present medications.                           - Repeat colonoscopy date to be determined after                            pending pathology results are reviewed for                            surveillance based on pathology results.                           -  Return to GI office (date not yet determined). Procedure Code(s):        --- Professional ---                           [phone], Colonoscopy, flexible; with biopsy, single                            or multiple                           G0500, Moderate sedation services provided by the                            same physician or other qualified health care                            professional performing a gastrointestinal                            endoscopic service that sedation supports,                            requiring the presence of an independent trained                            observer to assist in the monitoring of the                            patient's level of consciousness and physiological                            status; initial 15 minutes of intra-service time;                            patient age 83 years or older (additional time may                            be reported with [phone], as appropriate) Diagnosis Code(s):        --- Professional ---                            Z12.11, Encounter for screening for malignant                            neoplasm of colon                           D12.4, Benign neoplasm of descending colon                           D12.0, Benign neoplasm of cecum CPT copyright 2018 [employer]. All rights reserved. The codes documented in this report are preliminary and upon coder review may  be revised to meet current compliance requirements. Cristopher Estimable. Rourk, MD Norvel Richards, MD 08/22/2018 10:32:50 AM This report has been signed  electronically. Number of Addenda: 0

## 2018-08-23 ENCOUNTER — Encounter: Payer: Self-pay | Admitting: Internal Medicine

## 2018-08-28 DIAGNOSIS — F0280 Dementia in other diseases classified elsewhere without behavioral disturbance: Secondary | ICD-10-CM | POA: Diagnosis not present

## 2018-08-28 DIAGNOSIS — J069 Acute upper respiratory infection, unspecified: Secondary | ICD-10-CM | POA: Diagnosis not present

## 2018-08-28 DIAGNOSIS — J42 Unspecified chronic bronchitis: Secondary | ICD-10-CM | POA: Diagnosis not present

## 2018-08-28 DIAGNOSIS — G301 Alzheimer's disease with late onset: Secondary | ICD-10-CM | POA: Diagnosis not present

## 2018-08-28 DIAGNOSIS — R2681 Unsteadiness on feet: Secondary | ICD-10-CM | POA: Diagnosis not present

## 2018-08-28 DIAGNOSIS — E1165 Type 2 diabetes mellitus with hyperglycemia: Secondary | ICD-10-CM | POA: Diagnosis not present

## 2018-08-30 ENCOUNTER — Encounter (HOSPITAL_COMMUNITY): Payer: Self-pay | Admitting: Internal Medicine

## 2018-09-01 DIAGNOSIS — E1165 Type 2 diabetes mellitus with hyperglycemia: Secondary | ICD-10-CM | POA: Diagnosis not present

## 2018-09-01 DIAGNOSIS — J42 Unspecified chronic bronchitis: Secondary | ICD-10-CM | POA: Diagnosis not present

## 2018-09-01 DIAGNOSIS — F0280 Dementia in other diseases classified elsewhere without behavioral disturbance: Secondary | ICD-10-CM | POA: Diagnosis not present

## 2018-09-01 DIAGNOSIS — J069 Acute upper respiratory infection, unspecified: Secondary | ICD-10-CM | POA: Diagnosis not present

## 2018-09-01 DIAGNOSIS — G301 Alzheimer's disease with late onset: Secondary | ICD-10-CM | POA: Diagnosis not present

## 2018-09-01 DIAGNOSIS — R2681 Unsteadiness on feet: Secondary | ICD-10-CM | POA: Diagnosis not present

## 2018-09-04 DIAGNOSIS — G301 Alzheimer's disease with late onset: Secondary | ICD-10-CM | POA: Diagnosis not present

## 2018-09-04 DIAGNOSIS — J069 Acute upper respiratory infection, unspecified: Secondary | ICD-10-CM | POA: Diagnosis not present

## 2018-09-04 DIAGNOSIS — E1165 Type 2 diabetes mellitus with hyperglycemia: Secondary | ICD-10-CM | POA: Diagnosis not present

## 2018-09-04 DIAGNOSIS — J42 Unspecified chronic bronchitis: Secondary | ICD-10-CM | POA: Diagnosis not present

## 2018-09-04 DIAGNOSIS — R2681 Unsteadiness on feet: Secondary | ICD-10-CM | POA: Diagnosis not present

## 2018-09-04 DIAGNOSIS — F0280 Dementia in other diseases classified elsewhere without behavioral disturbance: Secondary | ICD-10-CM | POA: Diagnosis not present

## 2018-09-13 ENCOUNTER — Ambulatory Visit: Payer: Medicare Other | Admitting: "Endocrinology

## 2018-10-11 ENCOUNTER — Ambulatory Visit: Payer: Medicare Other | Admitting: Podiatry

## 2018-11-22 ENCOUNTER — Ambulatory Visit: Payer: Medicare Other | Admitting: Urology

## 2019-01-30 DIAGNOSIS — R600 Localized edema: Secondary | ICD-10-CM | POA: Diagnosis not present

## 2019-01-30 DIAGNOSIS — I10 Essential (primary) hypertension: Secondary | ICD-10-CM | POA: Diagnosis not present

## 2019-01-30 DIAGNOSIS — G301 Alzheimer's disease with late onset: Secondary | ICD-10-CM | POA: Diagnosis not present

## 2019-01-30 DIAGNOSIS — E1165 Type 2 diabetes mellitus with hyperglycemia: Secondary | ICD-10-CM | POA: Diagnosis not present

## 2019-02-01 DIAGNOSIS — I10 Essential (primary) hypertension: Secondary | ICD-10-CM | POA: Diagnosis not present

## 2019-02-02 DIAGNOSIS — N183 Chronic kidney disease, stage 3 (moderate): Secondary | ICD-10-CM | POA: Diagnosis not present

## 2019-02-02 DIAGNOSIS — R600 Localized edema: Secondary | ICD-10-CM | POA: Diagnosis not present

## 2019-02-02 DIAGNOSIS — E1165 Type 2 diabetes mellitus with hyperglycemia: Secondary | ICD-10-CM | POA: Diagnosis not present

## 2019-03-07 ENCOUNTER — Ambulatory Visit: Payer: Medicare Other | Admitting: Podiatry

## 2019-03-08 ENCOUNTER — Ambulatory Visit: Payer: Medicare Other | Admitting: Podiatry

## 2019-03-14 ENCOUNTER — Ambulatory Visit: Payer: Medicare Other | Admitting: "Endocrinology

## 2019-03-27 ENCOUNTER — Ambulatory Visit: Payer: Medicare Other | Admitting: "Endocrinology

## 2019-03-27 ENCOUNTER — Encounter: Payer: Self-pay | Admitting: "Endocrinology

## 2019-03-27 ENCOUNTER — Ambulatory Visit (INDEPENDENT_AMBULATORY_CARE_PROVIDER_SITE_OTHER): Payer: Medicare Other | Admitting: Urology

## 2019-03-27 DIAGNOSIS — R3915 Urgency of urination: Secondary | ICD-10-CM | POA: Diagnosis not present

## 2019-03-27 DIAGNOSIS — M5489 Other dorsalgia: Secondary | ICD-10-CM | POA: Diagnosis not present

## 2019-04-05 DIAGNOSIS — G301 Alzheimer's disease with late onset: Secondary | ICD-10-CM | POA: Diagnosis not present

## 2019-04-05 DIAGNOSIS — E1165 Type 2 diabetes mellitus with hyperglycemia: Secondary | ICD-10-CM | POA: Diagnosis not present

## 2019-04-05 DIAGNOSIS — Z1331 Encounter for screening for depression: Secondary | ICD-10-CM | POA: Diagnosis not present

## 2019-04-05 DIAGNOSIS — Z1389 Encounter for screening for other disorder: Secondary | ICD-10-CM | POA: Diagnosis not present

## 2019-04-05 DIAGNOSIS — I10 Essential (primary) hypertension: Secondary | ICD-10-CM | POA: Diagnosis not present

## 2019-04-05 LAB — HEMOGLOBIN A1C
Hemoglobin A1C: 8.8
Hemoglobin A1C: 8.8

## 2019-04-27 ENCOUNTER — Other Ambulatory Visit: Payer: Self-pay

## 2019-05-06 DIAGNOSIS — E1165 Type 2 diabetes mellitus with hyperglycemia: Secondary | ICD-10-CM | POA: Diagnosis not present

## 2019-05-06 DIAGNOSIS — I10 Essential (primary) hypertension: Secondary | ICD-10-CM | POA: Diagnosis not present

## 2019-05-17 ENCOUNTER — Encounter: Payer: Self-pay | Admitting: "Endocrinology

## 2019-05-17 ENCOUNTER — Other Ambulatory Visit: Payer: Self-pay

## 2019-05-17 ENCOUNTER — Ambulatory Visit (INDEPENDENT_AMBULATORY_CARE_PROVIDER_SITE_OTHER): Payer: Medicare Other | Admitting: "Endocrinology

## 2019-05-17 VITALS — BP 120/79 | HR 71 | Ht 64.0 in | Wt 254.0 lb

## 2019-05-17 DIAGNOSIS — I10 Essential (primary) hypertension: Secondary | ICD-10-CM | POA: Diagnosis not present

## 2019-05-17 DIAGNOSIS — E1165 Type 2 diabetes mellitus with hyperglycemia: Secondary | ICD-10-CM | POA: Diagnosis not present

## 2019-05-17 DIAGNOSIS — E1122 Type 2 diabetes mellitus with diabetic chronic kidney disease: Secondary | ICD-10-CM | POA: Diagnosis not present

## 2019-05-17 DIAGNOSIS — N184 Chronic kidney disease, stage 4 (severe): Secondary | ICD-10-CM | POA: Diagnosis not present

## 2019-05-17 DIAGNOSIS — IMO0002 Reserved for concepts with insufficient information to code with codable children: Secondary | ICD-10-CM

## 2019-05-17 DIAGNOSIS — E782 Mixed hyperlipidemia: Secondary | ICD-10-CM | POA: Diagnosis not present

## 2019-05-17 MED ORDER — EASYMAX TEST VI STRP: ORAL_STRIP | 3 refills | Status: DC

## 2019-05-17 NOTE — Progress Notes (Signed)
Endocrinology Consult Note       05/17/2019, 1:35 PM   Subjective:    Patient ID: Kathryn Cook, female    DOB: 1951/09/09.  Kathryn Cook is being seen in consultation for management of currently uncontrolled symptomatic diabetes requested by  [name], MD.   Past Medical History:  Diagnosis Date  . Bronchitis   . CVA (cerebral infarction)   . Diabetes mellitus without complication ([place])   . Hyperlipidemia   . Hypertension   . Memory loss   . Stroke ([place])   . Substance abuse ([place])    alcohol, but patient states she doesn't drink that much anymore    Past Surgical History:  Procedure Laterality Date  . COLONOSCOPY N/A 08/22/2018   Procedure: COLONOSCOPY;  Surgeon: Daneil Dolin, MD;  Location: [facility];  Service: Endoscopy;  Laterality: N/A;  10:30  . POLYPECTOMY  08/22/2018   Procedure: POLYPECTOMY;  Surgeon: Daneil Dolin, MD;  Location: [facility];  Service: Endoscopy;;  colon    Social History   Socioeconomic History  . Marital status: Single    Spouse name: Not on file  . Number of children: 1  . Years of education: 68  . Highest education level: Not on file  Occupational History  . Occupation: unemployed  Social Needs  . Financial resource strain: Not on file  . Food insecurity    Worry: Not on file    Inability: Not on file  . Transportation needs    Medical: Not on file    Non-medical: Not on file  Tobacco Use  . Smoking status: Former Smoker    Packs/day: 0.25    Years: 45.00    Pack years: 11.25    Types: Cigarettes    Quit date: 09/27/2013    Years since quitting: 5.6  . Smokeless tobacco: Never Used  Substance and Sexual Activity  . Alcohol use: No    Comment: occasional  . Drug use: No    Types: Marijuana    Comment: last used 3 months ago  . Sexual activity: Yes  Lifestyle  . Physical activity    Days per week: Not on file   Minutes per session: Not on file  . Stress: Not on file  Relationships  . Social Herbalist on phone: Not on file    Gets together: Not on file    Attends religious service: Not on file    Active member of club or organization: Not on file    Attends meetings of clubs or organizations: Not on file    Relationship status: Not on file  Other Topics Concern  . Not on file  Social History Narrative   Patient is single and lives at [facility] home.   Patient has one child.   Patient is right-handed.   Patient drinks two cups of coffee daily.   Patient has an 11 th grade education.    Family History  Problem Relation Age of Onset  . Other Mother        unknown  . Other Father  unknown    Outpatient Encounter Medications as of 05/17/2019  Medication Sig  . amLODipine (NORVASC) 5 MG tablet Take 5 mg by mouth daily.  Marland Kitchen aspirin EC 325 MG tablet Take 325 mg by mouth daily.  . carvedilol (COREG) 12.5 MG tablet Take 12.5 mg by mouth 2 (two) times daily with a meal.  . donepezil (ARICEPT) 10 MG tablet Take 10 mg by mouth at bedtime.  . furosemide (LASIX) 40 MG tablet Take 40 mg by mouth daily.  . insulin aspart (NOVOLOG FLEXPEN) 100 UNIT/ML FlexPen Inject 10-16 Units into the skin 3 (three) times daily with meals.  . Insulin Glargine (BASAGLAR KWIKPEN) 100 UNIT/ML SOPN Inject 50 Units into the skin at bedtime.  Marland Kitchen linagliptin (TRADJENTA) 5 MG TABS tablet Take 5 mg by mouth daily.  Marland Kitchen losartan (COZAAR) 100 MG tablet Take 100 mg by mouth daily.  . methenamine (HIPREX) 1 g tablet Take 1 g by mouth 2 (two) times daily with a meal.  . PARoxetine (PAXIL) 20 MG tablet Take 20 mg by mouth daily.  Marland Kitchen Propylene Glycol (SYSTANE COMPLETE OP) Apply to eye 3 (three) times daily.  . simvastatin (ZOCOR) 20 MG tablet Take 20 mg by mouth daily.  Marland Kitchen umeclidinium bromide (INCRUSE ELLIPTA) 62.5 MCG/INH AEPB Inhale 1 puff into the lungs daily.  Marland Kitchen glucose blood (EASYMAX TEST) test  strip Use as instructed  . [DISCONTINUED] ACCU-CHEK AVIVA PLUS test strip CHECK BLOOD SUGAR ONCE DAILY.  . [DISCONTINUED] albuterol (PROVENTIL HFA;VENTOLIN HFA) 108 (90 Base) MCG/ACT inhaler Inhale 1-2 puffs into the lungs every 6 (six) hours as needed for wheezing or shortness of breath.  . [DISCONTINUED] amLODipine (NORVASC) 5 MG tablet TAKE (1) TABLET BY MOUTH ONCE DAILY. (Patient taking differently: Take 5 mg by mouth daily at 8 pm. (2000))  . [DISCONTINUED] aspirin EC 325 MG tablet TAKE (1) TABLET BY MOUTH ONCE DAILY. (Patient taking differently: Take 325 mg by mouth daily. (0800))  . [DISCONTINUED] Blood Glucose Monitoring Suppl (EASYMAX L BLOOD GLUCOSE) DEVI Check BS QD and PRN. E11.9  . [DISCONTINUED] carvedilol (COREG) 12.5 MG tablet TAKE (1) TABLET BY MOUTH TWICE DAILY. (Patient taking differently: Take 12.5 mg by mouth 2 (two) times daily. (0800 & 1700))  . [DISCONTINUED] dextromethorphan-guaiFENesin (TUSSIN COUGH DM SUGAR FREE) 10-100 MG/5ML liquid Take 5-10 mLs by mouth every 6 (six) hours as needed (cough/congestion.).  . [DISCONTINUED] donepezil (ARICEPT) 10 MG tablet Take 10 mg by mouth at bedtime. (2000)  . [DISCONTINUED] hydrochlorothiazide (HYDRODIURIL) 25 MG tablet TAKE (1) TABLET BY MOUTH ONCE DAILY. (Patient taking differently: Take 25 mg by mouth daily. (0800))  . [DISCONTINUED] INCRUSE ELLIPTA 62.5 MCG/INH AEPB INHALE 1 PUFF BY MOUTH ONCE DAILY. (Patient taking differently: Inhale 1 puff into the lungs daily. (0800))  . [DISCONTINUED] insulin aspart (NOVOLOG) 100 UNIT/ML injection Inject 10 Units into the skin 3 (three) times daily before meals. Hold for blood sugar lower than 100.  . [DISCONTINUED] Insulin Glargine (BASAGLAR KWIKPEN) 100 UNIT/ML SOPN Inject 30 Units into the skin at bedtime. Hold for blood sugar lower than 70.  . [DISCONTINUED] Ipratropium-Albuterol (COMBIVENT RESPIMAT) 20-100 MCG/ACT AERS respimat Inhale 1 puff into the lungs every 6 (six) hours as needed  (cough/wheezing.).   . [DISCONTINUED] linagliptin (TRADJENTA) 5 MG TABS tablet Take 5 mg by mouth daily. (0800)  . [DISCONTINUED] losartan (COZAAR) 100 MG tablet TAKE (1) TABLET BY MOUTH ONCE DAILY. (Patient taking differently: Take 100 mg by mouth daily. (0800))  . [DISCONTINUED] mirabegron ER (MYRBETRIQ) 25 MG TB24  tablet Take 25 mg by mouth daily. (0800)  . [DISCONTINUED] PARoxetine (PAXIL) 20 MG tablet TAKE (1) TABLET BY MOUTH ONCE DAILY. (Patient taking differently: Take 20 mg by mouth daily. (0800))  . [DISCONTINUED] Polyethyl Glycol-Propyl Glycol (SYSTANE) 0.4-0.3 % SOLN Place 1 drop into both eyes 3 (three) times daily. (0800, 1400, 2000)  . [DISCONTINUED] polyethylene glycol-electrolytes (TRILYTE) 420 g solution Take 4,000 mLs by mouth as directed.  . [DISCONTINUED] simvastatin (ZOCOR) 20 MG tablet TAKE 1 TABLET BY MOUTH ONCE DAILY. (Patient taking differently: Take 20 mg by mouth daily at 6 PM. )  . [DISCONTINUED] therapeutic multivitamin-minerals (THERAGRAN-M) tablet One po qd (Patient taking differently: Take 1 tablet by mouth daily. (0800))  . [DISCONTINUED] betamethasone acetate-betamethasone sodium phosphate (CELESTONE) injection 3 mg    No facility-administered encounter medications on file as of 05/17/2019.     ALLERGIES: Allergies  Allergen Reactions  . Ace Inhibitors Cough  . Diovan [Valsartan] Cough    VACCINATION STATUS: Immunization History  Administered Date(s) Administered  . Influenza,inj,Quad PF,6+ Mos 07/20/2016  . Pneumococcal Conjugate-13 07/20/2016    Diabetes She presents for her initial diabetic visit. She has type 2 diabetes mellitus. Onset time: She was diagnosed at approximate age of 22years. Her disease course has been worsening. There are no hypoglycemic associated symptoms. Pertinent negatives for hypoglycemia include no confusion, headaches, pallor or seizures. Associated symptoms include fatigue, polydipsia and polyuria. Pertinent negatives for  diabetes include no chest pain and no polyphagia. There are no hypoglycemic complications. Symptoms are worsening. Diabetic complications include a CVA and nephropathy. Risk factors for coronary artery disease include dyslipidemia, diabetes mellitus, family history, obesity, hypertension, sedentary lifestyle, post-menopausal and tobacco exposure. Current diabetic treatment includes insulin injections. Her weight is increasing steadily. She is following a generally unhealthy diet. When asked about meal planning, she reported none. She never participates in exercise. Her breakfast blood glucose range is generally >200 mg/dl. Her lunch blood glucose range is generally >200 mg/dl. Her dinner blood glucose range is generally >200 mg/dl. Her bedtime blood glucose range is generally >200 mg/dl. Her overall blood glucose range is >200 mg/dl. An ACE inhibitor/angiotensin II receptor blocker is being taken.  Hyperlipidemia This is a chronic problem. The current episode started more than 1 year ago. The problem is controlled. Exacerbating diseases include chronic renal disease, diabetes and obesity. Pertinent negatives include no chest pain, myalgias or shortness of breath. Risk factors for coronary artery disease include dyslipidemia, diabetes mellitus, family history, obesity, hypertension, post-menopausal and a sedentary lifestyle.  Hypertension This is a chronic problem. The current episode started more than 1 year ago. The problem is controlled. Pertinent negatives include no chest pain, headaches, palpitations or shortness of breath. Risk factors for coronary artery disease include dyslipidemia, diabetes mellitus, obesity, sedentary lifestyle, smoking/tobacco exposure, family history and post-menopausal state. Hypertensive end-organ damage includes kidney disease and CVA. Identifiable causes of hypertension include chronic renal disease.     Review of Systems  Constitutional: Positive for fatigue. Negative for  chills, fever and unexpected weight change.  HENT: Negative for trouble swallowing and voice change.   Eyes: Negative for visual disturbance.  Respiratory: Negative for cough, shortness of breath and wheezing.   Cardiovascular: Negative for chest pain, palpitations and leg swelling.  Gastrointestinal: Negative for diarrhea, nausea and vomiting.  Endocrine: Positive for polydipsia and polyuria. Negative for cold intolerance, heat intolerance and polyphagia.  Musculoskeletal: Negative for arthralgias and myalgias.  Skin: Negative for color change, pallor, rash and wound.  Neurological: Negative  for seizures and headaches.  Psychiatric/Behavioral: Negative for confusion and suicidal ideas.    Objective:    BP 120/79   Pulse 71   Ht 5\' 4"  (1.626 m)   Wt 254 lb (115.2 kg)   BMI 43.60 kg/m   Wt Readings from Last 3 Encounters:  05/17/19 254 lb (115.2 kg)  08/22/18 257 lb (116.6 kg)  03/02/17 245 lb 12.8 oz (111.5 kg)     Physical Exam Constitutional:      Appearance: She is well-developed.  HENT:     Head: Normocephalic and atraumatic.  Neck:     Musculoskeletal: Normal range of motion and neck supple.     Thyroid: No thyromegaly.     Trachea: No tracheal deviation.  Cardiovascular:     Rate and Rhythm: Normal rate.  Pulmonary:     Effort: Pulmonary effort is normal.  Abdominal:     Tenderness: There is no abdominal tenderness. There is no guarding.  Musculoskeletal: Normal range of motion.  Skin:    General: Skin is warm and dry.     Coloration: Skin is not pale.     Findings: No erythema or rash.  Neurological:     Mental Status: She is alert and oriented to person, place, and time.     Cranial Nerves: No cranial nerve deficit.     Coordination: Coordination normal.     Deep Tendon Reflexes: Reflexes are normal and symmetric.  Psychiatric:        Judgment: Judgment normal.     CMP ( most recent) CMP     Component Value Date/Time   NA 142 07/19/2018 1224   NA  143 12/10/2016 1337   K 4.5 07/19/2018 1224   CL 106 07/19/2018 1224   CO2 27 07/19/2018 1224   GLUCOSE 68 (L) 07/19/2018 1224   BUN 39 (H) 07/19/2018 1224   BUN 26 12/10/2016 1337   CREATININE 1.99 (H) 07/19/2018 1224   CREATININE 1.14 (H) 10/17/2013 1500   CALCIUM 9.3 07/19/2018 1224   PROT 7.7 07/19/2018 1224   PROT 7.4 12/10/2016 1337   ALBUMIN 3.8 07/19/2018 1224   ALBUMIN 4.3 12/10/2016 1337   AST 21 07/19/2018 1224   ALT 22 07/19/2018 1224   ALKPHOS 88 07/19/2018 1224   BILITOT 0.6 07/19/2018 1224   BILITOT 0.2 12/10/2016 1337   GFRNONAA 25 (L) 07/19/2018 1224   GFRAA 29 (L) 07/19/2018 1224     Diabetic Labs (most recent): Lab Results  Component Value Date   HGBA1C 8.8 04/05/2019   HGBA1C 8.8 04/05/2019   HGBA1C 7.3 (H) 12/10/2016     Lipid Panel ( most recent) Lipid Panel     Component Value Date/Time   CHOL 195 12/10/2016 1337   TRIG 71 12/10/2016 1337   HDL 103 12/10/2016 1337   CHOLHDL 1.9 12/10/2016 1337   CHOLHDL 2.4 06/11/2013 0550   VLDL 12 06/11/2013 0550   LDLCALC 78 12/10/2016 1337      Lab Results  Component Value Date   TSH 2.108 05/31/2013           Assessment & Plan:   1. Uncontrolled type 2 diabetes mellitus with stage 4 chronic kidney disease ([place]) - Alfonso Shackett Billiter has currently uncontrolled symptomatic type 2 DM since  68 years of age,  with most recent A1c of 8.8 %. Recent labs reviewed. - I had a long discussion with her about the progressive nature of diabetes and the pathology behind its complications. -her diabetes is complicated  by CVA, CKD stage 3-4 and she remains at a high risk for more acute and chronic complications which include CAD, CVA, CKD, retinopathy, and neuropathy. These are all discussed in detail with her.  - I have counseled her on diet management and  Weight loss, by adopting a carbohydrate restricted/protein rich diet. - she admits that there is a room for improvement in her food and drink choices. -  Suggestion is made for her to avoid simple carbohydrates  from her diet including Cakes, Sweet Desserts, Ice Cream, Soda (diet and regular), Sweet Tea, Candies, Chips, Cookies, Store Bought Juices, Alcohol in Excess of  1-2 drinks a day, Artificial Sweeteners,  Coffee Creamer, and "Sugar-free" Products. This will help patient to have more stable blood glucose profile and potentially avoid unintended weight gain.  - I encouraged her to switch to  unprocessed or minimally processed complex starch and increased protein intake (animal or plant source), fruits, and vegetables.  - she is advised to stick to a routine mealtimes to eat 3 meals  a day and avoid unnecessary snacks ( to snack only to correct hypoglycemia).   - she will be scheduled with Jearld Fenton, RDN, CDE for diabetes education.  - I have approached her with the following individualized plan to manage  her diabetes and patient agrees:   - she will continue to need intensive treatment with basal/bolus insulin in order for her to achieve and maintain control of diabetes to target.   -She is accompanied by her aide from group home where she is residing due to  mental health and failure to thrive.   -She is advised to increase beta-blocker to 50 units daily at bedtime , and prandial insulin NovoLog 10 units 3 times a day with meals  for pre-meal BG readings of 90-150mg /dl, plus patient specific correction dose for unexpected hyperglycemia above 150mg /dl, associated with strict monitoring of glucose 4 times a day-before meals and at bedtime. - she is warned not to take insulin without proper monitoring per orders. - Adjustment parameters are given to her for hypo and hyperglycemia in writing. - she is encouraged to call clinic for blood glucose levels less than 70 or above 300 mg /dl. - she is advised to continue Tradjenta 5 mg p.o. daily, therapeutically suitable for patient .  - she is not a candidate for metformin, SGLT2 inhibitors due  to concurrent renal insufficiency.  - she will be considered for incretin therapy as appropriate next visit.  - Patient specific target  A1c;  LDL, HDL, Triglycerides, and  Waist Circumference were discussed in detail.  2) Blood Pressure /Hypertension:  her blood pressure is controlled to target.   she is advised to continue her current medications including losartan 100 mg p.o. daily with breakfast . 3) Lipids/Hyperlipidemia:   Review of her recent lipid panel showed  controlled  LDL at 78 .  she  is advised to continue    simvastatin 20 mg daily at bedtime.  Side effects and precautions discussed with her.  4)  Weight/Diet:  Body mass index is 43.6 kg/m.  -   clearly complicating her diabetes care.  I discussed with her the fact that loss of 5 - 10% of her  current body weight will have the most impact on her diabetes management.  CDE Consult will be initiated . Exercise, and detailed carbohydrates information provided  -  detailed on discharge instructions.  5) Chronic Care/Health Maintenance:  -she  is on ACEI/ARB and Statin medications  and  is encouraged to initiate and continue to follow up with Ophthalmology, Dentist,  Podiatrist at least yearly or according to recommendations, and advised to  stay away from smoking. I have recommended yearly flu vaccine and pneumonia vaccine at least every 5 years; moderate intensity exercise for up to 150 minutes weekly; and  sleep for at least 7 hours a day.  - she is  advised to maintain close follow up with [name], MD for primary care needs, as well as her other providers for optimal and coordinated care.  - Time spent with the patient: 45 minutes, of which >50% was spent in obtaining information about her symptoms, reviewing her previous labs/studies, evaluations, and treatments, counseling her about her currently uncontrolled, complicated type 2 diabetes; hyperlipidemia, hypertension, and developing plans for long term treatment based on the  latest standards of care/guidelines.  Please refer to " Patient Self Inventory" in the Media  tab for reviewed elements of pertinent patient history.  [place] participated in the discussions, expressed understanding, and voiced agreement with the above plans.  All questions were answered to her satisfaction. she is encouraged to contact clinic should she have any questions or concerns prior to her return visit.  Follow up plan: - Return in about 9 weeks (around 07/19/2019) for Bring Meter and Logs- A1c in Office.  Glade Lloyd, MD [facility] [address] [place], [place] [address] Phone: [phone]  Fax: [phone]    05/17/2019, 1:35 PM  This note was partially dictated with voice recognition software. Similar sounding words can be transcribed inadequately or may not  be corrected upon review.

## 2019-05-17 NOTE — Patient Instructions (Signed)

## 2019-06-06 DIAGNOSIS — E1165 Type 2 diabetes mellitus with hyperglycemia: Secondary | ICD-10-CM | POA: Diagnosis not present

## 2019-06-06 DIAGNOSIS — I10 Essential (primary) hypertension: Secondary | ICD-10-CM | POA: Diagnosis not present

## 2019-06-14 ENCOUNTER — Other Ambulatory Visit: Payer: Self-pay

## 2019-06-14 ENCOUNTER — Encounter: Payer: Self-pay | Admitting: Podiatry

## 2019-06-14 ENCOUNTER — Ambulatory Visit (INDEPENDENT_AMBULATORY_CARE_PROVIDER_SITE_OTHER): Payer: Medicare Other | Admitting: Podiatry

## 2019-06-14 DIAGNOSIS — M79676 Pain in unspecified toe(s): Secondary | ICD-10-CM

## 2019-06-14 DIAGNOSIS — E1142 Type 2 diabetes mellitus with diabetic polyneuropathy: Secondary | ICD-10-CM

## 2019-06-14 DIAGNOSIS — B351 Tinea unguium: Secondary | ICD-10-CM

## 2019-06-14 NOTE — Progress Notes (Signed)
Complaint:  Visit Type: Patient returns to my office for continued preventative foot care services. Complaint: Patient states" my nails have grown long and thick and become painful to walk and wear shoes" Patient has been diagnosed with DM with neuropathy.. The patient presents for preventative foot care services. No changes to ROS  Podiatric Exam: Vascular: dorsalis pedis are palpable bilateral.  Posterior tibial pulses are absent  B/L. Capillary return is immediate. Temperature gradient is WNL. Skin turgor WNL  Sensorium: Normal Semmes Weinstein monofilament test. Normal tactile sensation bilaterally. Nail Exam: Pt has thick disfigured discolored nails with subungual debris noted bilateral entire nail hallux through fifth toenails Ulcer Exam: There is no evidence of ulcer or pre-ulcerative changes or infection. Orthopedic Exam: Muscle tone and strength are WNL. No limitations in general ROM. No crepitus or effusions noted. Foot type and digits show no abnormalities. Bony prominences are unremarkable. Skin: No Porokeratosis. No infection or ulcers  Diagnosis:  Onychomycosis, , Pain in right toe, pain in left toes  Treatment & Plan Procedures and Treatment: Consent by patient was obtained for treatment procedures.   Debridement of mycotic and hypertrophic toenails, 1 through 5 bilateral and clearing of subungual debris. No ulceration, no infection noted.  Return Visit-Office Procedure: Patient instructed to return to the office for a follow up visit 4 months for continued evaluation and treatment.    Gardiner Barefoot DPM

## 2019-07-10 DIAGNOSIS — B369 Superficial mycosis, unspecified: Secondary | ICD-10-CM | POA: Diagnosis not present

## 2019-07-10 DIAGNOSIS — E1165 Type 2 diabetes mellitus with hyperglycemia: Secondary | ICD-10-CM | POA: Diagnosis not present

## 2019-07-10 DIAGNOSIS — Z23 Encounter for immunization: Secondary | ICD-10-CM | POA: Diagnosis not present

## 2019-07-10 DIAGNOSIS — I10 Essential (primary) hypertension: Secondary | ICD-10-CM | POA: Diagnosis not present

## 2019-07-10 DIAGNOSIS — G301 Alzheimer's disease with late onset: Secondary | ICD-10-CM | POA: Diagnosis not present

## 2019-07-16 ENCOUNTER — Other Ambulatory Visit: Payer: Self-pay | Admitting: "Endocrinology

## 2019-07-25 ENCOUNTER — Encounter: Payer: Self-pay | Admitting: "Endocrinology

## 2019-07-25 ENCOUNTER — Other Ambulatory Visit: Payer: Self-pay

## 2019-07-25 ENCOUNTER — Ambulatory Visit: Payer: Medicare Other | Admitting: "Endocrinology

## 2019-07-25 ENCOUNTER — Ambulatory Visit (INDEPENDENT_AMBULATORY_CARE_PROVIDER_SITE_OTHER): Payer: Medicare Other | Admitting: "Endocrinology

## 2019-07-25 VITALS — BP 133/78 | HR 65 | Ht 64.0 in | Wt 258.0 lb

## 2019-07-25 DIAGNOSIS — I10 Essential (primary) hypertension: Secondary | ICD-10-CM

## 2019-07-25 DIAGNOSIS — E1165 Type 2 diabetes mellitus with hyperglycemia: Secondary | ICD-10-CM

## 2019-07-25 DIAGNOSIS — E1122 Type 2 diabetes mellitus with diabetic chronic kidney disease: Secondary | ICD-10-CM | POA: Diagnosis not present

## 2019-07-25 DIAGNOSIS — IMO0002 Reserved for concepts with insufficient information to code with codable children: Secondary | ICD-10-CM

## 2019-07-25 DIAGNOSIS — E782 Mixed hyperlipidemia: Secondary | ICD-10-CM

## 2019-07-25 DIAGNOSIS — N184 Chronic kidney disease, stage 4 (severe): Secondary | ICD-10-CM | POA: Diagnosis not present

## 2019-07-25 LAB — POCT GLYCOSYLATED HEMOGLOBIN (HGB A1C): Hemoglobin A1C: 8.1 % — AB (ref 4.0–5.6)

## 2019-07-25 NOTE — Patient Instructions (Signed)

## 2019-07-25 NOTE — Progress Notes (Signed)
07/25/2019, 4:51 PM                   Endocrinology follow-up note    Subjective:    Patient ID: Kathryn Cook, female    DOB: [birth year].  Kathryn Cook is being seen in follow-up after she was seen in consultation for management of currently uncontrolled symptomatic diabetes requested by  [name], MD.   Past Medical History:  Diagnosis Date  . Bronchitis   . CVA (cerebral infarction)   . Diabetes mellitus without complication ([place])   . Hyperlipidemia   . Hypertension   . Memory loss   . Stroke ([place])   . Substance abuse ([place])    alcohol, but patient states she doesn't drink that much anymore    Past Surgical History:  Procedure Laterality Date  . COLONOSCOPY N/A 08/22/2018   Procedure: COLONOSCOPY;  Surgeon: Daneil Dolin, MD;  [facility];  Service: Endoscopy;  Laterality: N/A;  10:30  . POLYPECTOMY  08/22/2018   Procedure: POLYPECTOMY;  Surgeon: Daneil Dolin, MD;  [facility];  Service: Endoscopy;;  colon    Social History   Socioeconomic History  . Marital status: Single    Spouse name: Not on file  . Number of children: 1  . Years of education: 77  . Highest education level: Not on file  Occupational History  . Occupation: unemployed  Social Needs  . Financial resource strain: Not on file  . Food insecurity    Worry: Not on file    Inability: Not on file  . Transportation needs    Medical: Not on file    Non-medical: Not on file  Tobacco Use  . Smoking status: Former Smoker    Packs/day: 0.25    Years: 45.00    Pack years: 11.25    Types: Cigarettes    Quit date: 09/27/2013    Years since quitting: 5.8  . Smokeless tobacco: Never Used  Substance and Sexual Activity  . Alcohol use: No    Comment: occasional  . Drug use: No    Types: Marijuana    Comment: last used 3 months ago  . Sexual activity: Yes  Lifestyle  .  Physical activity    Days per week: Not on file    Minutes per session: Not on file  . Stress: Not on file  Relationships  . Social Herbalist on phone: Not on file    Gets together: Not on file    Attends religious service: Not on file    Active member of club or organization: Not on file    Attends meetings of clubs or organizations: Not on file    Relationship status: Not on file  Other Topics Concern  . Not on file  Social History Narrative   Patient is single and lives at [facility] family Care home.   Patient has one child.   Patient is right-handed.   Patient drinks two cups of coffee daily.   Patient has an 11 th grade education.    Family History  Problem Relation Age of  Onset  . Other Mother        unknown  . Other Father        unknown    Outpatient Encounter Medications as of 07/25/2019  Medication Sig  . amLODipine (NORVASC) 5 MG tablet Take 5 mg by mouth daily.  Marland Kitchen aspirin EC 325 MG tablet Take 325 mg by mouth daily.  . carvedilol (COREG) 12.5 MG tablet Take 12.5 mg by mouth 2 (two) times daily with a meal.  . COMBIVENT RESPIMAT 20-100 MCG/ACT AERS respimat   . donepezil (ARICEPT) 10 MG tablet Take 10 mg by mouth at bedtime.  . furosemide (LASIX) 40 MG tablet Take 40 mg by mouth daily.  Marland Kitchen glucose blood (EASYMAX TEST) test strip Use as instructed  . insulin aspart (NOVOLOG FLEXPEN) 100 UNIT/ML FlexPen Inject 10-16 Units into the skin 3 (three) times daily with meals.  . Insulin Glargine (BASAGLAR KWIKPEN) 100 UNIT/ML SOPN INJECT 50 UNITS SUBCUTANEOUSLY AT BEDTIME.  Marland Kitchen linagliptin (TRADJENTA) 5 MG TABS tablet Take 5 mg by mouth daily.  Marland Kitchen losartan (COZAAR) 100 MG tablet Take 100 mg by mouth daily.  . methenamine (HIPREX) 1 g tablet Take 1 g by mouth 2 (two) times daily with a meal.  . PARoxetine (PAXIL) 20 MG tablet Take 20 mg by mouth daily.  Marland Kitchen Propylene Glycol (SYSTANE COMPLETE OP) Apply to eye 3 (three) times daily.  . simvastatin (ZOCOR) 20 MG  tablet Take 20 mg by mouth daily.  Marland Kitchen umeclidinium bromide (INCRUSE ELLIPTA) 62.5 MCG/INH AEPB Inhale 1 puff into the lungs daily.   No facility-administered encounter medications on file as of 07/25/2019.     ALLERGIES: Allergies  Allergen Reactions  . Ace Inhibitors Cough  . Diovan [Valsartan] Cough    VACCINATION STATUS: Immunization History  Administered Date(s) Administered  . Influenza,inj,Quad PF,6+ Mos 07/20/2016  . Pneumococcal Conjugate-13 07/20/2016    Diabetes She presents for her follow-up diabetic visit. She has type 2 diabetes mellitus. Onset time: She was diagnosed at approximate age of 15years. Her disease course has been improving. There are no hypoglycemic associated symptoms. Pertinent negatives for hypoglycemia include no confusion, headaches, pallor or seizures. Associated symptoms include fatigue, polydipsia and polyuria. Pertinent negatives for diabetes include no chest pain and no polyphagia. There are no hypoglycemic complications. Symptoms are improving. Diabetic complications include a CVA and nephropathy. Risk factors for coronary artery disease include dyslipidemia, diabetes mellitus, family history, obesity, hypertension, sedentary lifestyle, post-menopausal and tobacco exposure. Current diabetic treatment includes insulin injections. Her weight is fluctuating minimally. She is following a generally unhealthy diet. When asked about meal planning, she reported none. She never participates in exercise. Her breakfast blood glucose range is generally 140-180 mg/dl. Her lunch blood glucose range is generally 180-200 mg/dl. Her dinner blood glucose range is generally 180-200 mg/dl. Her bedtime blood glucose range is generally 180-200 mg/dl. Her overall blood glucose range is 180-200 mg/dl. An ACE inhibitor/angiotensin II receptor blocker is being taken.  Hyperlipidemia This is a chronic problem. The current episode started more than 1 year ago. The problem is  controlled. Exacerbating diseases include chronic renal disease, diabetes and obesity. Pertinent negatives include no chest pain, myalgias or shortness of breath. Risk factors for coronary artery disease include dyslipidemia, diabetes mellitus, family history, obesity, hypertension, post-menopausal and a sedentary lifestyle.  Hypertension This is a chronic problem. The current episode started more than 1 year ago. The problem is controlled. Pertinent negatives include no chest pain, headaches, palpitations or shortness of breath. Risk  factors for coronary artery disease include dyslipidemia, diabetes mellitus, obesity, sedentary lifestyle, smoking/tobacco exposure, family history and post-menopausal state. Hypertensive end-organ damage includes kidney disease and CVA. Identifiable causes of hypertension include chronic renal disease.     Review of Systems  Constitutional: Positive for fatigue. Negative for chills, fever and unexpected weight change.  HENT: Negative for trouble swallowing and voice change.   Eyes: Negative for visual disturbance.  Respiratory: Negative for cough, shortness of breath and wheezing.   Cardiovascular: Negative for chest pain, palpitations and leg swelling.  Gastrointestinal: Negative for diarrhea, nausea and vomiting.  Endocrine: Positive for polydipsia and polyuria. Negative for cold intolerance, heat intolerance and polyphagia.  Musculoskeletal: Negative for arthralgias and myalgias.  Skin: Negative for color change, pallor, rash and wound.  Neurological: Negative for seizures and headaches.  Psychiatric/Behavioral: Negative for confusion and suicidal ideas.    Objective:    BP 133/78   Pulse 65   Ht 5\' 4"  (1.626 m)   Wt 258 lb (117 kg)   BMI 44.29 kg/m   Wt Readings from Last 3 Encounters:  07/25/19 258 lb (117 kg)  05/17/19 254 lb (115.2 kg)  08/22/18 257 lb (116.6 kg)     Physical Exam Constitutional:      Appearance: She is well-developed.   HENT:     Head: Normocephalic and atraumatic.  Neck:     Musculoskeletal: Normal range of motion and neck supple.     Thyroid: No thyromegaly.     Trachea: No tracheal deviation.  Cardiovascular:     Rate and Rhythm: Normal rate.  Pulmonary:     Effort: Pulmonary effort is normal.  Abdominal:     Tenderness: There is no abdominal tenderness. There is no guarding.  Musculoskeletal: Normal range of motion.  Skin:    General: Skin is warm and dry.     Coloration: Skin is not pale.     Findings: No erythema or rash.  Neurological:     Mental Status: She is alert and oriented to person, place, and time.     Cranial Nerves: No cranial nerve deficit.     Coordination: Coordination normal.     Deep Tendon Reflexes: Reflexes are normal and symmetric.  Psychiatric:        Judgment: Judgment normal.     CMP ( most recent) CMP     Component Value Date/Time   NA 142 07/19/2018 1224   NA 143 12/10/2016 1337   K 4.5 07/19/2018 1224   CL 106 07/19/2018 1224   CO2 27 07/19/2018 1224   GLUCOSE 68 (L) 07/19/2018 1224   BUN 39 (H) 07/19/2018 1224   BUN 26 12/10/2016 1337   CREATININE 1.99 (H) 07/19/2018 1224   CREATININE 1.14 (H) 10/17/2013 1500   CALCIUM 9.3 07/19/2018 1224   PROT 7.7 07/19/2018 1224   PROT 7.4 12/10/2016 1337   ALBUMIN 3.8 07/19/2018 1224   ALBUMIN 4.3 12/10/2016 1337   AST 21 07/19/2018 1224   ALT 22 07/19/2018 1224   ALKPHOS 88 07/19/2018 1224   BILITOT 0.6 07/19/2018 1224   BILITOT 0.2 12/10/2016 1337   GFRNONAA 25 (L) 07/19/2018 1224   GFRAA 29 (L) 07/19/2018 1224     Diabetic Labs (most recent): Lab Results  Component Value Date   HGBA1C 8.1 (A) 07/25/2019   HGBA1C 8.8 04/05/2019   HGBA1C 8.8 04/05/2019     Lipid Panel ( most recent) Lipid Panel     Component Value Date/Time   CHOL 195 12/10/2016  1337   TRIG 71 12/10/2016 1337   HDL 103 12/10/2016 1337   CHOLHDL 1.9 12/10/2016 1337   CHOLHDL 2.4 06/11/2013 0550   VLDL 12 06/11/2013 0550    LDLCALC 78 12/10/2016 1337      Lab Results  Component Value Date   TSH 2.108 05/31/2013      Assessment & Plan:   1. Uncontrolled type 2 diabetes mellitus with stage 4 chronic kidney disease ([place]) - Kathryn Cook has currently uncontrolled symptomatic type 2 DM since  68 years of age.  -She returns with near target fasting glycemic profile, slightly above target postprandial readings.  Her point-of-care A1c was 8.1% improving from 8.8 %. Recent labs reviewed. - I had a long discussion with her about the progressive nature of diabetes and the pathology behind its complications. -her diabetes is complicated by CVA, CKD stage 3-4 and she remains at a high risk for more acute and chronic complications which include CAD, CVA, CKD, retinopathy, and neuropathy. These are all discussed in detail with her.  - I have counseled her on diet management and  Weight loss, by adopting a carbohydrate restricted/protein rich diet.  - she  admits there is a room for improvement in her diet and drink choices. -  Suggestion is made for her to avoid simple carbohydrates  from her diet including Cakes, Sweet Desserts / Pastries, Ice Cream, Soda (diet and regular), Sweet Tea, Candies, Chips, Cookies, Sweet Pastries,  Store Bought Juices, Alcohol in Excess of  1-2 drinks a day, Artificial Sweeteners, Coffee Creamer, and "Sugar-free" Products. This will help patient to have stable blood glucose profile and potentially avoid unintended weight gain.   - I encouraged her to switch to  unprocessed or minimally processed complex starch and increased protein intake (animal or plant source), fruits, and vegetables.  - she is advised to stick to a routine mealtimes to eat 3 meals  a day and avoid unnecessary snacks ( to snack only to correct hypoglycemia).   - she will be scheduled with Jearld Fenton, RDN, CDE for diabetes education.  - I have approached her with the following individualized plan to manage   her diabetes and patient agrees:   - she will continue to need intensive treatment with basal/bolus insulin in order for her to achieve and maintain control of diabetes to target.   -She is accompanied by her aide from group home where she is residing due to  her mental health problems and failure to care for self.   -Priority #1 in her care will be to avoid hypoglycemia.  She is advised to continue Basaglar  50 units daily at bedtime , and prandial insulin NovoLog 10 units 3 times a day with meals  for pre-meal BG readings of 90-150mg /dl, plus patient specific correction dose for unexpected hyperglycemia above 150mg /dl, associated with strict monitoring of glucose 4 times a day-before meals and at bedtime. - she is warned not to take insulin without proper monitoring per orders. - Adjustment parameters are given to her for hypo and hyperglycemia in writing. - she is encouraged to call clinic for blood glucose levels less than 70 or above 300 mg /dl. - she is advised to continue Tradjenta 5 mg p.o. daily, therapeutically suitable for patient .  - she is not a candidate for metformin, SGLT2 inhibitors due to concurrent renal insufficiency.  - Patient specific target  A1c;  LDL, HDL, Triglycerides, and  Waist Circumference were discussed in detail.  2) Blood Pressure /  Hypertension: Her blood pressure is controlled to target. she is advised to continue her current medications including losartan 100 mg p.o. daily with breakfast . 3) Lipids/Hyperlipidemia:   Review of her recent lipid panel showed  controlled  LDL at 78 .  she  is advised to continue simvastatin 20 mg p.o. daily at bedtime.  Side effects and precautions discussed with her.  4)  Weight/Diet:  Body mass index is 44.29 kg/m.  -   clearly complicating her diabetes care.  I discussed with her the fact that loss of 5 - 10% of her  current body weight will have the most impact on her diabetes management.  CDE Consult will be initiated .  Exercise, and detailed carbohydrates information provided  -  detailed on discharge instructions.  5) Chronic Care/Health Maintenance:  -she  is on ACEI/ARB and Statin medications and  is encouraged to initiate and continue to follow up with Ophthalmology, Dentist,  Podiatrist at least yearly or according to recommendations, and advised to  stay away from smoking. I have recommended yearly flu vaccine and pneumonia vaccine at least every 5 years; moderate intensity exercise for up to 150 minutes weekly; and  sleep for at least 7 hours a day.  - she is  advised to maintain close follow up with [name], MD for primary care needs, as well as her other providers for optimal and coordinated care.  - Time spent with the patient: 25 min, of which >50% was spent in reviewing her blood glucose logs , discussing her hypoglycemia and hyperglycemia episodes, reviewing her current and  previous labs / studies and medications  doses and developing a plan to avoid hypoglycemia and hyperglycemia. Please refer to Patient Instructions for Blood Glucose Monitoring and Insulin/Medications Dosing Guide"  in media tab for additional information. Please  also refer to " Patient Self Inventory" in the Media  tab for reviewed elements of pertinent patient history.  Hagerman participated in the discussions, expressed understanding, and voiced agreement with the above plans.  All questions were answered to her satisfaction. she is encouraged to contact clinic should she have any questions or concerns prior to her return visit.   Follow up plan: - Return in about 4 months (around 11/25/2019) for Bring Meter and Logs- A1c in Office.  Glade Lloyd, MD [facility] [address] [place], [place] [address] Phone: [phone]  Fax: [phone]    07/25/2019, 4:51 PM  This note was partially dictated with voice recognition software. Similar sounding words  can be transcribed inadequately or may not  be corrected upon review.

## 2019-08-28 ENCOUNTER — Telehealth: Payer: Self-pay | Admitting: "Endocrinology

## 2019-08-28 NOTE — Telephone Encounter (Signed)
No change for now, call back if she has >300 x 3 again.

## 2019-08-28 NOTE — Telephone Encounter (Signed)
Spoke with Kathryn Cook and advised of message below.

## 2019-08-28 NOTE — Telephone Encounter (Signed)
Patient's blood sugar yesterday morning was 360, lunch it was 320, supper it was 407 and bedtime it was 390. This morning it was 165. Please advise

## 2019-09-13 ENCOUNTER — Ambulatory Visit: Payer: Medicare Other | Admitting: Podiatry

## 2019-10-01 ENCOUNTER — Other Ambulatory Visit: Payer: Self-pay | Admitting: "Endocrinology

## 2019-10-15 DIAGNOSIS — E1165 Type 2 diabetes mellitus with hyperglycemia: Secondary | ICD-10-CM | POA: Diagnosis not present

## 2019-10-15 DIAGNOSIS — N390 Urinary tract infection, site not specified: Secondary | ICD-10-CM | POA: Diagnosis not present

## 2019-10-15 DIAGNOSIS — G301 Alzheimer's disease with late onset: Secondary | ICD-10-CM | POA: Diagnosis not present

## 2019-10-15 DIAGNOSIS — I10 Essential (primary) hypertension: Secondary | ICD-10-CM | POA: Diagnosis not present

## 2019-11-15 DIAGNOSIS — G301 Alzheimer's disease with late onset: Secondary | ICD-10-CM | POA: Diagnosis not present

## 2019-11-15 DIAGNOSIS — E1165 Type 2 diabetes mellitus with hyperglycemia: Secondary | ICD-10-CM | POA: Diagnosis not present

## 2019-11-21 DIAGNOSIS — Z23 Encounter for immunization: Secondary | ICD-10-CM | POA: Diagnosis not present

## 2019-11-28 ENCOUNTER — Ambulatory Visit: Payer: Medicare Other | Admitting: "Endocrinology

## 2019-11-29 ENCOUNTER — Other Ambulatory Visit: Payer: Self-pay | Admitting: "Endocrinology

## 2019-12-13 DIAGNOSIS — E1165 Type 2 diabetes mellitus with hyperglycemia: Secondary | ICD-10-CM | POA: Diagnosis not present

## 2019-12-13 DIAGNOSIS — I10 Essential (primary) hypertension: Secondary | ICD-10-CM | POA: Diagnosis not present

## 2019-12-19 DIAGNOSIS — Z23 Encounter for immunization: Secondary | ICD-10-CM | POA: Diagnosis not present

## 2019-12-23 DIAGNOSIS — Z20828 Contact with and (suspected) exposure to other viral communicable diseases: Secondary | ICD-10-CM | POA: Diagnosis not present

## 2019-12-26 ENCOUNTER — Ambulatory Visit
Admission: EM | Admit: 2019-12-26 | Discharge: 2019-12-26 | Disposition: A | Discharge status: Home / Self Care | Payer: Medicare Other | Attending: Emergency Medicine | Admitting: Emergency Medicine

## 2019-12-26 ENCOUNTER — Other Ambulatory Visit: Payer: Self-pay

## 2019-12-26 DIAGNOSIS — I959 Hypotension, unspecified: Secondary | ICD-10-CM | POA: Diagnosis not present

## 2019-12-26 DIAGNOSIS — R350 Frequency of micturition: Secondary | ICD-10-CM

## 2019-12-26 LAB — POCT URINALYSIS DIP (MANUAL ENTRY)
Bilirubin, UA: NEGATIVE
Glucose, UA: NEGATIVE mg/dL
Ketones, POC UA: NEGATIVE mg/dL
Nitrite, UA: NEGATIVE
Spec Grav, UA: 1.025 (ref 1.010–1.025)
Urobilinogen, UA: 0.2 E.U./dL
pH, UA: 5 (ref 5.0–8.0)

## 2019-12-26 LAB — POCT FASTING CBG KUC MANUAL ENTRY: POCT Glucose (KUC): 89 mg/dL (ref 70–99)

## 2019-12-26 NOTE — ED Triage Notes (Signed)
Pt is brought in by paid cg stating that she has been urinating frequently and is acting unwell.

## 2019-12-26 NOTE — ED Provider Notes (Signed)
[facility]   CC: Urine frequency and unwell  SUBJECTIVE:  Kathryn Cook is a 69 y.o. female with history of diabetes, chronic kidney disease, dementia present with her caregiver to the urgent care with a complaint  of urinary frequency, and unwell for the past few days.  Patient denies a precipitating event, recent sexual encounter, excessive caffeine intake.  Denies  lower abdomen/ flank pain.  Has not tried any OTC medication..  Symptoms are made worse with urination.  Admits to similar symptoms in the past and was diagnosed with UTI.  Denies fever, chills, nausea, vomiting, abdominal pain, flank pain, abnormal vaginal discharge or bleeding, hematuria.    LMP: No LMP recorded. Patient is postmenopausal.  ROS: As in HPI.  All other pertinent ROS negative.     Past Medical History:  Diagnosis Date  . Bronchitis   . CVA (cerebral infarction)   . Diabetes mellitus without complication ([place])   . Hyperlipidemia   . Hypertension   . Memory loss   . Stroke ([place])   . Substance abuse ([place])    alcohol, but patient states she doesn't drink that much anymore   Past Surgical History:  Procedure Laterality Date  . COLONOSCOPY N/A 08/22/2018   Procedure: COLONOSCOPY;  Surgeon: Daneil Dolin, MD;  [facility];  Service: Endoscopy;  Laterality: N/A;  10:30  . POLYPECTOMY  08/22/2018   Procedure: POLYPECTOMY;  Surgeon: Daneil Dolin, MD;  [facility];  Service: Endoscopy;;  colon   Allergies  Allergen Reactions  . Ace Inhibitors Cough  . Diovan [Valsartan] Cough   No current facility-administered medications on file prior to encounter.   Current Outpatient Medications on File Prior to Encounter  Medication Sig Dispense Refill  . amLODipine (NORVASC) 5 MG tablet Take 5 mg by mouth daily.    Marland Kitchen aspirin EC 325 MG tablet Take 325 mg by mouth daily.    . carvedilol (COREG) 12.5 MG tablet Take 12.5 mg by mouth 2 (two) times daily with a meal.    .  COMBIVENT RESPIMAT 20-100 MCG/ACT AERS respimat     . donepezil (ARICEPT) 10 MG tablet Take 10 mg by mouth at bedtime.    . furosemide (LASIX) 40 MG tablet Take 40 mg by mouth daily.    Marland Kitchen glucose blood (EASYMAX TEST) test strip Use as instructed 150 each 3  . Insulin Glargine (BASAGLAR KWIKPEN) 100 UNIT/ML SOPN INJECT 50 UNITS SUBCUTANEOUSLY AT BEDTIME. 15 mL 3  . linagliptin (TRADJENTA) 5 MG TABS tablet Take 5 mg by mouth daily.    Marland Kitchen losartan (COZAAR) 100 MG tablet Take 100 mg by mouth daily.    . methenamine (HIPREX) 1 g tablet Take 1 g by mouth 2 (two) times daily with a meal.    . NOVOLOG FLEXPEN 100 UNIT/ML FlexPen INJECT SQ SLIDING SCALE 3 TIMES DAILY WITH MEALS 90-150=10U:151-200=11U:20 1-250=12U:251-300=13U:301 -350=14U:351-400=15U:ABOV E400 CALL MD 5 pen 0  . PARoxetine (PAXIL) 20 MG tablet Take 20 mg by mouth daily.    Marland Kitchen Propylene Glycol (SYSTANE COMPLETE OP) Apply to eye 3 (three) times daily.    . simvastatin (ZOCOR) 20 MG tablet Take 20 mg by mouth daily.    Marland Kitchen umeclidinium bromide (INCRUSE ELLIPTA) 62.5 MCG/INH AEPB Inhale 1 puff into the lungs daily.     Social History   Socioeconomic History  . Marital status: Single    Spouse name: Not on file  . Number of children: 1  . Years of  education: 81  . Highest education level: Not on file  Occupational History  . Occupation: unemployed  Tobacco Use  . Smoking status: Former Smoker    Packs/day: 0.25    Years: 45.00    Pack years: 11.25    Types: Cigarettes    Quit date: 09/27/2013    Years since quitting: 6.2  . Smokeless tobacco: Never Used  Substance and Sexual Activity  . Alcohol use: No    Comment: occasional  . Drug use: No    Types: Marijuana    Comment: last used 3 months ago  . Sexual activity: Yes  Other Topics Concern  . Not on file  Social History Narrative   Patient is single and lives at [facility].   Patient has one child.   Patient is right-handed.   Patient drinks two cups of  coffee daily.   Patient has an 11 th grade education.   Social Determinants of Health   Financial Resource Strain:   . Difficulty of Paying Living Expenses:   Food Insecurity:   . Worried About Charity fundraiser in the Last Year:   . Arboriculturist in the Last Year:   Transportation Needs:   . Film/video editor (Medical):   Marland Kitchen Lack of Transportation (Non-Medical):   Physical Activity:   . Days of Exercise per Week:   . Minutes of Exercise per Session:   Stress:   . Feeling of Stress :   Social Connections:   . Frequency of Communication with Friends and Family:   . Frequency of Social Gatherings with Friends and Family:   . Attends Religious Services:   . Active Member of Clubs or Organizations:   . Attends Archivist Meetings:   Marland Kitchen Marital Status:   Intimate Partner Violence:   . Fear of Current or Ex-Partner:   . Emotionally Abused:   Marland Kitchen Physically Abused:   . Sexually Abused:    Family History  Problem Relation Age of Onset  . Other Mother        unknown  . Other Father        unknown    OBJECTIVE:  Vitals:   12/26/19 1548 12/26/19 1617  BP: 93/68 95/63  Pulse: 89   Resp: 18   Temp: 98.9 F (37.2 C)   SpO2: 92%    General appearance: AOx3 in no acute distress HEENT: NCAT.  Oropharynx clear.  Lungs: clear to auscultation bilaterally without adventitious breath sounds Heart: regular rate and rhythm.  Radial pulses 2+ symmetrical bilaterally Abdomen: soft; non-distended; no tenderness; bowel sounds present; no guarding or rebound tenderness Back: no CVA tenderness Extremities: no edema; symmetrical with no gross deformities Skin: warm and dry Neurologic: Ambulates from chair to exam table without difficulty Psychological: alert and cooperative; normal mood and affect  Labs Reviewed  POCT URINALYSIS DIP (MANUAL ENTRY) - Abnormal; Notable for the following components:      Result Value   Clarity, UA cloudy (*)    Blood, UA trace-intact (*)     Protein Ur, POC trace (*)    Leukocytes, UA Small (1+) (*)    All other components within normal limits  URINE CULTURE  POCT FASTING CBG KUC MANUAL ENTRY    ASSESSMENT & PLAN:  1. Urine frequency   2. Hypotension, unspecified hypotension type    POCT urine analysis shows trace of RBC and  protein and small amount of leukocyte which is inconclusive for UTI.  Will culture urine.  Low blood pressure likely from dehydration as patient is on Lasix and has not been drinking enough fluid.  Was advised to go to ED for further evaluation. Caregiver state she will take her tonight to the emergency department.  No orders of the defined types were placed in this encounter.  DISCHARGE INSTRUCTION POCT urine analysis show trace of blood, trace of protein and small amount of leukocyte.  Urine will be cultured Low blood pressure likely from dehydration. Advised patient to go to ED for further evaluation.   Outlined signs and symptoms indicating need for more acute intervention. Patient verbalized understanding. After Visit Summary given.     Emerson Monte, FNP 12/26/19 1627

## 2019-12-26 NOTE — Discharge Instructions (Signed)
POCT urine analysis show trace of blood, trace of protein and small amount of leukocyte.  Urine will be cultured Low blood pressure likely from dehydration. Advised patient to go to ED for further evaluation.

## 2019-12-29 LAB — URINE CULTURE: Culture: >=100000 — AB

## 2019-12-31 ENCOUNTER — Telehealth: Payer: Self-pay

## 2019-12-31 DIAGNOSIS — Z20828 Contact with and (suspected) exposure to other viral communicable diseases: Secondary | ICD-10-CM | POA: Diagnosis not present

## 2019-12-31 MED ORDER — CEPHALEXIN 500 MG PO CAPS: 500.0000 mg | ORAL_CAPSULE | Freq: Two times a day (BID) | ORAL | 0 refills | Status: AC

## 2019-12-31 NOTE — Telephone Encounter (Signed)
Received v/m from pt re urine cx results.  Reviewed results.  Spoke to pt and  Caregiver and advised of culture.  Sending abx to preferred pharmacy.  Advised pt to return for further evaluation if s/s continue after completion of abx.  Caregiver verbalizes understanding.

## 2020-01-06 DIAGNOSIS — Z20828 Contact with and (suspected) exposure to other viral communicable diseases: Secondary | ICD-10-CM | POA: Diagnosis not present

## 2020-01-07 ENCOUNTER — Emergency Department (HOSPITAL_COMMUNITY)
Admission: EM | Admit: 2020-01-07 | Discharge: 2020-01-08 | Disposition: A | Discharge status: Home / Self Care | Payer: Medicare Other | Attending: Emergency Medicine | Admitting: Emergency Medicine

## 2020-01-07 ENCOUNTER — Encounter (HOSPITAL_COMMUNITY): Payer: Self-pay | Admitting: Emergency Medicine

## 2020-01-07 ENCOUNTER — Other Ambulatory Visit: Payer: Self-pay

## 2020-01-07 DIAGNOSIS — E119 Type 2 diabetes mellitus without complications: Secondary | ICD-10-CM | POA: Diagnosis not present

## 2020-01-07 DIAGNOSIS — I10 Essential (primary) hypertension: Secondary | ICD-10-CM | POA: Diagnosis not present

## 2020-01-07 DIAGNOSIS — Z794 Long term (current) use of insulin: Secondary | ICD-10-CM | POA: Diagnosis not present

## 2020-01-07 DIAGNOSIS — R531 Weakness: Secondary | ICD-10-CM | POA: Insufficient documentation

## 2020-01-07 DIAGNOSIS — Z7982 Long term (current) use of aspirin: Secondary | ICD-10-CM | POA: Insufficient documentation

## 2020-01-07 DIAGNOSIS — Z79899 Other long term (current) drug therapy: Secondary | ICD-10-CM | POA: Insufficient documentation

## 2020-01-07 DIAGNOSIS — I959 Hypotension, unspecified: Secondary | ICD-10-CM | POA: Diagnosis not present

## 2020-01-07 DIAGNOSIS — R0902 Hypoxemia: Secondary | ICD-10-CM | POA: Diagnosis not present

## 2020-01-07 DIAGNOSIS — Z87891 Personal history of nicotine dependence: Secondary | ICD-10-CM | POA: Insufficient documentation

## 2020-01-07 DIAGNOSIS — R197 Diarrhea, unspecified: Secondary | ICD-10-CM | POA: Diagnosis not present

## 2020-01-07 DIAGNOSIS — R5381 Other malaise: Secondary | ICD-10-CM | POA: Diagnosis not present

## 2020-01-07 LAB — BASIC METABOLIC PANEL
Anion gap: 11 (ref 5–15)
BUN: 47 mg/dL — ABNORMAL HIGH (ref 8–23)
CO2: 23 mmol/L (ref 22–32)
Calcium: 9.7 mg/dL (ref 8.9–10.3)
Chloride: 104 mmol/L (ref 98–111)
Creatinine, Ser: 2.19 mg/dL — ABNORMAL HIGH (ref 0.44–1.00)
GFR calc Af Amer: 26 mL/min — ABNORMAL LOW (ref >60)
GFR calc non Af Amer: 22 mL/min — ABNORMAL LOW (ref >60)
Glucose, Bld: 235 mg/dL — ABNORMAL HIGH (ref 70–99)
Potassium: 4.3 mmol/L (ref 3.5–5.1)
Sodium: 138 mmol/L (ref 135–145)

## 2020-01-07 LAB — CBC
HCT: 42.5 % (ref 36.0–46.0)
Hemoglobin: 12.9 g/dL (ref 12.0–15.0)
MCH: 26.7 pg (ref 26.0–34.0)
MCHC: 30.4 g/dL (ref 30.0–36.0)
MCV: 87.8 fL (ref 80.0–100.0)
Platelets: 237 10*3/uL (ref 150–400)
RBC: 4.84 MIL/uL (ref 3.87–5.11)
RDW: 14.5 % (ref 11.5–15.5)
WBC: 8.3 10*3/uL (ref 4.0–10.5)
nRBC: 0 % (ref 0.0–0.2)

## 2020-01-07 LAB — CBG MONITORING, ED: Glucose-Capillary: 184 mg/dL — ABNORMAL HIGH (ref 70–99)

## 2020-01-07 MED ORDER — SODIUM CHLORIDE 0.9% FLUSH: 3.0000 mL | Freq: Once | INTRAVENOUS | Status: DC

## 2020-01-07 NOTE — ED Triage Notes (Signed)
Pt arrived by [facility] from [facility] for weakness for about 3-4 days. Per ems pt has been treated for UTI and just finished her antibiotics. Facility states shes not been eating or drinking much and has been having loose stools for 3-4 days.

## 2020-01-07 NOTE — ED Notes (Signed)
Called no answer

## 2020-01-08 DIAGNOSIS — R197 Diarrhea, unspecified: Secondary | ICD-10-CM | POA: Diagnosis not present

## 2020-01-08 LAB — URINALYSIS, ROUTINE W REFLEX MICROSCOPIC
Bilirubin Urine: NEGATIVE
Glucose, UA: NEGATIVE mg/dL
Hgb urine dipstick: NEGATIVE
Ketones, ur: NEGATIVE mg/dL
Leukocytes,Ua: NEGATIVE
Nitrite: NEGATIVE
Protein, ur: NEGATIVE mg/dL
Specific Gravity, Urine: 1.012 (ref 1.005–1.030)
pH: 5 (ref 5.0–8.0)

## 2020-01-08 NOTE — ED Provider Notes (Signed)
[facility] EMERGENCY DEPARTMENT Provider Note   CSN: [id number] Arrival date & time: 01/07/20  2120     History Chief Complaint  Patient presents with  . Weakness    Kathryn Cook is a 69 y.o. female.  HPI     This is a 69 year old female with a history of dementia, diabetes, hypertension, hyperlipidemia who presents with reported decreased oral intake, diarrhea.  Per report, patient was recently treated for UTI and finished her antibiotics.  Facility stated she had not been eating well over the last several days.  Patient is without significant complaint.  She does report that she has had some diarrhea.  Denies abdominal pain, nausea, vomiting.  When asked why she has not been eating she states "because I have not wanted to."  She is requesting something to drink right now.  She denies any fevers.  She is awake, alert, and oriented x3 at this time.  Past Medical History:  Diagnosis Date  . Bronchitis   . CVA (cerebral infarction)   . Diabetes mellitus without complication ([place])   . Hyperlipidemia   . Hypertension   . Memory loss   . Stroke ([place])   . Substance abuse ([place])    alcohol, but patient states she doesn't drink that much anymore    Patient Active Problem List   Diagnosis Date Noted  . Mixed hyperlipidemia 05/17/2019  . Essential hypertension, benign 05/17/2019  . Healthcare maintenance 03/02/2016  . Type II diabetes mellitus with neurological manifestations ([place]) 06/12/2015  . Moderate dementia without behavioral disturbance ([place]) 12/06/2013  . Dementia ([place]) 10/18/2013  . Tobacco abuse 10/18/2013  . HTN (hypertension), malignant 10/18/2013  . Uncontrolled type 2 diabetes mellitus with stage 4 chronic kidney disease ([place]) 10/01/2013  . Cough 08/27/2013  . Tobacco use disorder 08/27/2013  . H/O: CVA (cerebrovascular accident) 08/27/2013  . Vascular dementia ([place]) 08/27/2013  . Diastolic congestive heart failure, NYHA class 1 ([place]) 06/13/2013  . LVH  (left ventricular hypertrophy) due to hypertensive disease 06/13/2013  . CVA (cerebral infarction) 06/10/2013  . History of alcohol use 05/31/2013  . HTN (hypertension) 05/31/2013  . Memory loss 05/31/2013  . Kidney disease, chronic, stage III (GFR 30-59 ml/min) 05/31/2013    Past Surgical History:  Procedure Laterality Date  . COLONOSCOPY N/A 08/22/2018   Procedure: COLONOSCOPY;  Surgeon: Daneil Dolin, MD;  Location: [facility]O [facility]ITE;  Service: Endoscopy;  Laterality: N/A;  10:30  . POLYPECTOMY  08/22/2018   Procedure: POLYPECTOMY;  Surgeon: Daneil Dolin, MD;  Location: [facility] END[facility];  Service: Endoscopy;;  colon     OB History    Gravida  1   Para  1   Term  1   Preterm      AB      Living  1     SAB      TAB      Ectopic      Multiple      Live Births              Family History  Problem Relation Age of Onset  . Other Mother        unknown  . Other Father        unknown    Social History   Tobacco Use  . Smoking status: Former Smoker    Packs/day: 0.25    Years: 45.00    Pack years: 11.25    Types: Cigarettes    Quit date: 09/27/2013  Years since quitting: 6.2  . Smokeless tobacco: Never Used  Substance Use Topics  . Alcohol use: No    Comment: occasional  . Drug use: No    Types: Marijuana    Comment: last used 3 months ago    Home Medications Prior to Admission medications   Medication Sig Start Date End Date Taking? Authorizing Provider  amLODipine (NORVASC) 5 MG tablet Take 5 mg by mouth daily.    [provider]  aspirin EC 325 MG tablet Take 325 mg by mouth daily.    [provider]  carvedilol (COREG) 12.5 MG tablet Take 12.5 mg by mouth 2 (two) times daily with a meal.    [provider]  COMBIVENT RESPIMAT 20-100 MCG/ACT AERS respimat  06/05/19   [provider]  donepezil (ARICEPT) 10 MG tablet Take 10 mg by mouth at bedtime.    [provider]  furosemide (LASIX) 40 MG tablet  Take 40 mg by mouth daily.    [provider]  glucose blood (EASYMAX TEST) test strip Use as instructed 05/17/19   Cassandria Anger, MD  Insulin Glargine [facility] KWIKPEN) 100 UNIT/ML SOPN INJECT 50 UNITS SUBCUTANEOUSLY AT BEDTIME. 07/17/19   Nida, Marella Chimes, MD  linagliptin (TRADJENTA) 5 MG TABS tablet Take 5 mg by mouth daily.    [provider]  losartan (COZAAR) 100 MG tablet Take 100 mg by mouth daily.    [provider]  methenamine (HIPREX) 1 g tablet Take 1 g by mouth 2 (two) times daily with a meal.    [provider]  NOVOLOG FLEXPEN 100 UNIT/ML FlexPen INJECT SQ SLIDING SCALE 3 TIMES DAILY WITH MEALS 90-150=10U:151-200=11U:20 1-250=12U:251-300=13U:301 -350=14U:351-400=15U:ABOV E400 CALL MD 11/30/19   Cassandria Anger, MD  PARoxetine (PAXIL) 20 MG tablet Take 20 mg by mouth daily.    [provider]  Propylene Glycol (SYSTANE COMPLETE OP) Apply to eye 3 (three) times daily.    [provider]  simvastatin (ZOCOR) 20 MG tablet Take 20 mg by mouth daily.    [provider]  umeclidinium bromide (INCRUSE ELLIPTA) 62.5 MCG/INH AEPB Inhale 1 puff into the lungs daily.    [provider]    Allergies    Ace inhibitors and Diovan [valsartan]  Review of Systems   Review of Systems  Constitutional: Negative for fever.  Respiratory: Negative for shortness of breath.   Cardiovascular: Negative for chest pain.  Gastrointestinal: Positive for diarrhea. Negative for abdominal pain, nausea and vomiting.  Genitourinary: Negative for dysuria.  All other systems reviewed and are negative.   Physical Exam Updated Vital Signs BP 124/71 (BP Location: Left Arm)   Pulse 80   Temp 98.3 F (36.8 C) (Oral)   Resp 14   Ht 1.626 m (5\' 4" )   Wt 110 kg   SpO2 94%   BMI 41.63 kg/m   Physical Exam Vitals and nursing note reviewed.  Constitutional:      Appearance: She is well-developed. She is obese. She is not  ill-appearing.  HENT:     Head: Normocephalic and atraumatic.     Mouth/Throat:     Mouth: Mucous membranes are moist.  Eyes:     Pupils: Pupils are equal, round, and reactive to light.  Cardiovascular:     Rate and Rhythm: Normal rate and regular rhythm.     Heart sounds: Normal heart sounds.  Pulmonary:     Effort: Pulmonary effort is normal. No respiratory distress.  Breath sounds: No wheezing.  Abdominal:     General: Bowel sounds are normal.     Palpations: Abdomen is soft.     Tenderness: There is no abdominal tenderness. There is no guarding or rebound.  Musculoskeletal:     Cervical back: Neck supple.     Right lower leg: Edema present.     Left lower leg: Edema present.  Skin:    General: Skin is warm and dry.  Neurological:     Mental Status: She is alert and oriented to person, place, and time.  Psychiatric:        Mood and Affect: Mood normal.     ED Results / Procedures / Treatments   Labs (all labs ordered are listed, but only abnormal results are displayed) Labs Reviewed  BASIC METABOLIC PANEL - Abnormal; Notable for the following components:      Result Value   Glucose, Bld 235 (*)    BUN 47 (*)    Creatinine, Ser 2.19 (*)    GFR calc non Af Amer 22 (*)    GFR calc Af Amer 26 (*)    All other components within normal limits  URINALYSIS, ROUTINE W REFLEX MICROSCOPIC - Abnormal; Notable for the following components:   APPearance HAZY (*)    All other components within normal limits  CBG MONITORING, ED - Abnormal; Notable for the following components:   Glucose-Capillary 184 (*)    All other components within normal limits  URINE CULTURE  CBC    EKG EKG Interpretation  Date/Time:  Monday January 07 2020 23:37:20 EDT Ventricular Rate:  75 PR Interval:    QRS Duration: 89 QT Interval:  390 QTC Calculation: 436 R Axis:   -55 Text Interpretation: Sinus rhythm Inferior infarct, old Confirmed by Thayer Jew [phone]) on 01/08/2020 12:35:57  AM   Radiology No results found.  Procedures Procedures (including critical care time)  Medications Ordered in ED Medications  sodium chloride flush (NS) 0.9 % injection 3 mL (has no administration in time range)    ED Course  I have reviewed the triage vital signs and the nursing notes.  Pertinent labs & imaging results that were available during my care of the patient were reviewed by me and considered in my medical decision making (see chart for details).    MDM Rules/Calculators/A&P                       Patient presents with report of diarrhea and decreased oral intake.  Recent UTI.  She is overall nontoxic and vital signs are reassuring.  She is awake, alert, and oriented for me.  Her physical exam is fairly benign including a nontender abdomen.  Urinalysis does not show any evidence of acute infection.  I will reculture.  BMP shows a glucose of 235 without an anion gap.  Creatinine is 2.19.  This is up from most recent 2 years ago at 1.99.  However, GFR is approximately the same.  Suspect this is her baseline.  She does not have any evidence of dehydration on exam.  She has no complaints for me and is drinking without difficulty.  EKG shows no evidence of acute ischemia or arrhythmia.  She has not had any diarrhea here and has been unable to provide a stool sample.  Her living facility was called and updated.  At this time I do not feel she has an acute emergent process.  She has ongoing diarrhea, would recommend stool  studies given recent antibiotic use.  After history, exam, and medical workup I feel the patient has been appropriately medically screened and is safe for discharge home. Pertinent diagnoses were discussed with the patient. Patient was given return precautions.   Final Clinical Impression(s) / ED Diagnoses Final diagnoses:  Diarrhea, unspecified type    Rx / DC Orders ED Discharge Orders    None       Merryl Hacker, MD 01/08/20 [phone]

## 2020-01-08 NOTE — Discharge Instructions (Addendum)
Patient was seen today for diarrhea and decreased oral intake.  Lab work is reassuring including repeat urinalysis.  If she has ongoing diarrhea or worsening diarrhea, she should have stool studies done.  These can be done by her primary physician.

## 2020-01-09 DIAGNOSIS — E1165 Type 2 diabetes mellitus with hyperglycemia: Secondary | ICD-10-CM | POA: Diagnosis not present

## 2020-01-09 DIAGNOSIS — I10 Essential (primary) hypertension: Secondary | ICD-10-CM | POA: Diagnosis not present

## 2020-01-09 DIAGNOSIS — R197 Diarrhea, unspecified: Secondary | ICD-10-CM | POA: Diagnosis not present

## 2020-01-09 LAB — URINE CULTURE

## 2020-01-10 ENCOUNTER — Ambulatory Visit: Payer: Medicare Other | Admitting: Podiatry

## 2020-01-13 DIAGNOSIS — Z20828 Contact with and (suspected) exposure to other viral communicable diseases: Secondary | ICD-10-CM | POA: Diagnosis not present

## 2020-01-14 ENCOUNTER — Other Ambulatory Visit: Payer: Self-pay | Admitting: "Endocrinology

## 2020-01-20 DIAGNOSIS — Z20828 Contact with and (suspected) exposure to other viral communicable diseases: Secondary | ICD-10-CM | POA: Diagnosis not present

## 2020-01-23 ENCOUNTER — Encounter: Payer: Self-pay | Admitting: Internal Medicine

## 2020-01-23 DIAGNOSIS — E1165 Type 2 diabetes mellitus with hyperglycemia: Secondary | ICD-10-CM | POA: Diagnosis not present

## 2020-01-23 DIAGNOSIS — R197 Diarrhea, unspecified: Secondary | ICD-10-CM | POA: Diagnosis not present

## 2020-01-28 ENCOUNTER — Telehealth: Payer: Self-pay | Admitting: "Endocrinology

## 2020-01-28 ENCOUNTER — Encounter: Payer: Self-pay | Admitting: "Endocrinology

## 2020-01-28 ENCOUNTER — Other Ambulatory Visit: Payer: Self-pay

## 2020-01-28 ENCOUNTER — Ambulatory Visit (INDEPENDENT_AMBULATORY_CARE_PROVIDER_SITE_OTHER): Payer: Medicare Other | Admitting: "Endocrinology

## 2020-01-28 VITALS — BP 134/86 | HR 79 | Ht 64.0 in | Wt 234.6 lb

## 2020-01-28 DIAGNOSIS — E782 Mixed hyperlipidemia: Secondary | ICD-10-CM | POA: Diagnosis not present

## 2020-01-28 DIAGNOSIS — I10 Essential (primary) hypertension: Secondary | ICD-10-CM

## 2020-01-28 DIAGNOSIS — E1165 Type 2 diabetes mellitus with hyperglycemia: Secondary | ICD-10-CM

## 2020-01-28 DIAGNOSIS — E1122 Type 2 diabetes mellitus with diabetic chronic kidney disease: Secondary | ICD-10-CM

## 2020-01-28 DIAGNOSIS — N184 Chronic kidney disease, stage 4 (severe): Secondary | ICD-10-CM | POA: Diagnosis not present

## 2020-01-28 DIAGNOSIS — IMO0002 Reserved for concepts with insufficient information to code with codable children: Secondary | ICD-10-CM

## 2020-01-28 LAB — POCT GLYCOSYLATED HEMOGLOBIN (HGB A1C): Hemoglobin A1C: 7.4 % — AB (ref 4.0–5.6)

## 2020-01-28 NOTE — Progress Notes (Signed)
01/28/2020, 5:54 PM                   Endocrinology follow-up note    Subjective:    Patient ID: Kathryn Cook, female    DOB: [birth year].  Kathryn Cook is being seen in follow-up after she was seen in consultation for management of currently uncontrolled symptomatic diabetes requested by  [name], MD. -She is accompanied and assisted by one of her aides from nursing home.   Past Medical History:  Diagnosis Date  . Bronchitis   . CVA (cerebral infarction)   . Diabetes mellitus without complication ([place])   . Hyperlipidemia   . Hypertension   . Memory loss   . Stroke ([place])   . Substance abuse ([place])    alcohol, but patient states she doesn't drink that much anymore    Past Surgical History:  Procedure Laterality Date  . COLONOSCOPY N/A 08/22/2018   Procedure: COLONOSCOPY;  Surgeon: Daneil Dolin, MD;  Location: [facility];  Service: Endoscopy;  Laterality: N/A;  10:30  . POLYPECTOMY  08/22/2018   Procedure: POLYPECTOMY;  Surgeon: Daneil Dolin, MD;  Location: [facility];  Service: Endoscopy;;  colon    Social History   Socioeconomic History  . Marital status: Single    Spouse name: Not on file  . Number of children: 1  . Years of education: 53  . Highest education level: Not on file  Occupational History  . Occupation: unemployed  Tobacco Use  . Smoking status: Former Smoker    Packs/day: 0.25    Years: 45.00    Pack years: 11.25    Types: Cigarettes    Quit date: 09/27/2013    Years since quitting: 6.3  . Smokeless tobacco: Never Used  Substance and Sexual Activity  . Alcohol use: No    Comment: occasional  . Drug use: No    Types: Marijuana    Comment: last used 3 months ago  . Sexual activity: Yes  Other Topics Concern  . Not on file  Social History Narrative   Patient is single and lives at [facility].   Patient has one child.    Patient is right-handed.   Patient drinks two cups of coffee daily.   Patient has an 11 th grade education.   Social Determinants of Health   Financial Resource Strain:   . Difficulty of Paying Living Expenses:   Food Insecurity:   . Worried About Charity fundraiser in the Last Year:   . Arboriculturist in the Last Year:   Transportation Needs:   . Film/video editor (Medical):   Marland Kitchen Lack of Transportation (Non-Medical):   Physical Activity:   . Days of Exercise per Week:   . Minutes of Exercise per Session:   Stress:   . Feeling of Stress :   Social Connections:   . Frequency of Communication with Friends and Family:   . Frequency of Social Gatherings with Friends and Family:   . Attends Religious Services:   . Active Member of Clubs or Organizations:   .  Attends Archivist Meetings:   Marland Kitchen Marital Status:     Family History  Problem Relation Age of Onset  . Other Mother        unknown  . Other Father        unknown    Outpatient Encounter Medications as of 01/28/2020  Medication Sig  . amLODipine (NORVASC) 5 MG tablet Take 5 mg by mouth daily.  Marland Kitchen aspirin EC 325 MG tablet Take 325 mg by mouth daily.  . carvedilol (COREG) 12.5 MG tablet Take 12.5 mg by mouth 2 (two) times daily with a meal.  . COMBIVENT RESPIMAT 20-100 MCG/ACT AERS respimat   . donepezil (ARICEPT) 10 MG tablet Take 10 mg by mouth at bedtime.  . furosemide (LASIX) 40 MG tablet Take 40 mg by mouth daily.  Marland Kitchen glucose blood (EASYMAX TEST) test strip Use as instructed  . Insulin Glargine (BASAGLAR KWIKPEN) 100 UNIT/ML SOPN INJECT 50 UNITS SUBCUTANEOUSLY AT BEDTIME.  Marland Kitchen linagliptin (TRADJENTA) 5 MG TABS tablet Take 5 mg by mouth daily.  Marland Kitchen losartan (COZAAR) 100 MG tablet Take 100 mg by mouth daily.  . methenamine (HIPREX) 1 g tablet Take 1 g by mouth 2 (two) times daily with a meal.  . NOVOLOG FLEXPEN 100 UNIT/ML FlexPen INJECT SQ SLIDING SCALE 3 TIMES DAILY WITH MEALS 90-150=10U:151-200=11U:20  1-250=12U:251-300=13U:301 -350=14U:351-400=15U:ABOV E400 CALL MD  . PARoxetine (PAXIL) 20 MG tablet Take 20 mg by mouth daily.  Marland Kitchen Propylene Glycol (SYSTANE COMPLETE OP) Apply to eye 3 (three) times daily.  . simvastatin (ZOCOR) 20 MG tablet Take 20 mg by mouth daily.  Marland Kitchen umeclidinium bromide (INCRUSE ELLIPTA) 62.5 MCG/INH AEPB Inhale 1 puff into the lungs daily.   No facility-administered encounter medications on file as of 01/28/2020.    ALLERGIES: Allergies  Allergen Reactions  . Ace Inhibitors Cough  . Diovan [Valsartan] Cough    VACCINATION STATUS: Immunization History  Administered Date(s) Administered  . Influenza,inj,Quad PF,6+ Mos 07/20/2016  . Pneumococcal Conjugate-13 07/20/2016    Diabetes She presents for her follow-up diabetic visit. She has type 2 diabetes mellitus. Onset time: She was diagnosed at approximate age of 69years. Her disease course has been improving. There are no hypoglycemic associated symptoms. Pertinent negatives for hypoglycemia include no confusion, headaches, pallor or seizures. Associated symptoms include fatigue, polydipsia and polyuria. Pertinent negatives for diabetes include no chest pain and no polyphagia. There are no hypoglycemic complications. Symptoms are improving. Diabetic complications include a CVA and nephropathy. Risk factors for coronary artery disease include dyslipidemia, diabetes mellitus, family history, obesity, hypertension, sedentary lifestyle, post-menopausal and tobacco exposure. Current diabetic treatment includes insulin injections. Her weight is fluctuating minimally. She is following a generally unhealthy diet. When asked about meal planning, she reported none. She never participates in exercise. Her home blood glucose trend is decreasing steadily. Her breakfast blood glucose range is generally 140-180 mg/dl. Her lunch blood glucose range is generally 140-180 mg/dl. Her dinner blood glucose range is generally 140-180 mg/dl. Her  bedtime blood glucose range is generally 140-180 mg/dl. Her overall blood glucose range is 140-180 mg/dl. An ACE inhibitor/angiotensin II receptor blocker is being taken.  Hyperlipidemia This is a chronic problem. The current episode started more than 1 year ago. The problem is controlled. Exacerbating diseases include chronic renal disease, diabetes and obesity. Pertinent negatives include no chest pain, myalgias or shortness of breath. Risk factors for coronary artery disease include dyslipidemia, diabetes mellitus, family history, obesity, hypertension, post-menopausal and a sedentary lifestyle.  Hypertension This is  a chronic problem. The current episode started more than 1 year ago. The problem is controlled. Pertinent negatives include no chest pain, headaches, palpitations or shortness of breath. Risk factors for coronary artery disease include dyslipidemia, diabetes mellitus, obesity, sedentary lifestyle, smoking/tobacco exposure, family history and post-menopausal state. Hypertensive end-organ damage includes kidney disease and CVA. Identifiable causes of hypertension include chronic renal disease.     Review of Systems  Constitutional: Positive for fatigue. Negative for chills, fever and unexpected weight change.  HENT: Negative for trouble swallowing and voice change.   Eyes: Negative for visual disturbance.  Respiratory: Negative for cough, shortness of breath and wheezing.   Cardiovascular: Negative for chest pain, palpitations and leg swelling.  Gastrointestinal: Negative for diarrhea, nausea and vomiting.  Endocrine: Positive for polydipsia and polyuria. Negative for cold intolerance, heat intolerance and polyphagia.  Musculoskeletal: Negative for arthralgias and myalgias.  Skin: Negative for color change, pallor, rash and wound.  Neurological: Negative for seizures and headaches.  Psychiatric/Behavioral: Negative for confusion and suicidal ideas.    Objective:    BP 134/86    Pulse 79   Ht 5\' 4"  (1.626 m)   Wt 234 lb 9.6 oz (106.4 kg)   BMI 40.27 kg/m   Wt Readings from Last 3 Encounters:  01/28/20 234 lb 9.6 oz (106.4 kg)  01/07/20 242 lb 8.1 oz (110 kg)  07/25/19 258 lb (117 kg)     Physical Exam Constitutional:      Appearance: She is well-developed.  HENT:     Head: Normocephalic and atraumatic.  Neck:     Thyroid: No thyromegaly.     Trachea: No tracheal deviation.  Cardiovascular:     Rate and Rhythm: Normal rate.  Pulmonary:     Effort: Pulmonary effort is normal.  Abdominal:     Tenderness: There is no abdominal tenderness. There is no guarding.  Musculoskeletal:        General: Normal range of motion.     Cervical back: Normal range of motion and neck supple.  Skin:    General: Skin is warm and dry.     Coloration: Skin is not pale.     Findings: No erythema or rash.  Neurological:     Mental Status: She is alert and oriented to person, place, and time.     Cranial Nerves: No cranial nerve deficit.     Coordination: Coordination normal.     Deep Tendon Reflexes: Reflexes are normal and symmetric.  Psychiatric:        Judgment: Judgment normal.     CMP ( most recent) CMP     Component Value Date/Time   NA 138 01/07/2020 2255   NA 143 12/10/2016 1337   K 4.3 01/07/2020 2255   CL 104 01/07/2020 2255   CO2 23 01/07/2020 2255   GLUCOSE 235 (H) 01/07/2020 2255   BUN 47 (H) 01/07/2020 2255   BUN 26 12/10/2016 1337   CREATININE 2.19 (H) 01/07/2020 2255   CREATININE 1.14 (H) 10/17/2013 1500   CALCIUM 9.7 01/07/2020 2255   PROT 7.7 07/19/2018 1224   PROT 7.4 12/10/2016 1337   ALBUMIN 3.8 07/19/2018 1224   ALBUMIN 4.3 12/10/2016 1337   AST 21 07/19/2018 1224   ALT 22 07/19/2018 1224   ALKPHOS 88 07/19/2018 1224   BILITOT 0.6 07/19/2018 1224   BILITOT 0.2 12/10/2016 1337   GFRNONAA 22 (L) 01/07/2020 2255   GFRAA 26 (L) 01/07/2020 2255     Diabetic Labs (most recent): Lab  Results  Component Value Date   HGBA1C 7.4 (A)  01/28/2020   HGBA1C 8.1 (A) 07/25/2019   HGBA1C 8.8 04/05/2019   HGBA1C 8.8 04/05/2019     Lipid Panel ( most recent) Lipid Panel     Component Value Date/Time   CHOL 195 12/10/2016 1337   TRIG 71 12/10/2016 1337   HDL 103 12/10/2016 1337   CHOLHDL 1.9 12/10/2016 1337   CHOLHDL 2.4 06/11/2013 0550   VLDL 12 06/11/2013 0550   LDLCALC 78 12/10/2016 1337      Lab Results  Component Value Date   TSH 2.108 05/31/2013      Assessment & Plan:   1. Uncontrolled type 2 diabetes mellitus with stage 4 chronic kidney disease ([place]) - Jalie Eiland Gillum has currently uncontrolled symptomatic type 2 DM since  69 years of age.  -She returns with near target fasting glycemic profile, and a point-of-care A1c is 7.4%, overall improving from 8.8%.  No major  hypoglycemia was documented or reported on her logs.     Recent labs reviewed. - I had a long discussion with her about the progressive nature of diabetes and the pathology behind its complications. -her diabetes is complicated by CVA, CKD stage 3-4 and she remains at a high risk for more acute and chronic complications which include CAD, CVA, CKD, retinopathy, and neuropathy. These are all discussed in detail with her.  - I have counseled her on diet management and  Weight loss, by adopting a carbohydrate restricted/protein rich diet.  - she  admits there is a room for improvement in her diet and drink choices. -  Suggestion is made for her to avoid simple carbohydrates  from her diet including Cakes, Sweet Desserts / Pastries, Ice Cream, Soda (diet and regular), Sweet Tea, Candies, Chips, Cookies, Sweet Pastries,  Store Bought Juices, Alcohol in Excess of  1-2 drinks a day, Artificial Sweeteners, Coffee Creamer, and "Sugar-free" Products. This will help patient to have stable blood glucose profile and potentially avoid unintended weight gain.  - I encouraged her to switch to  unprocessed or minimally processed complex starch and  increased protein intake (animal or plant source), fruits, and vegetables.  - she is advised to stick to a routine mealtimes to eat 3 meals  a day and avoid unnecessary snacks ( to snack only to correct hypoglycemia).   - she will be scheduled with Jearld Fenton, RDN, CDE for diabetes education.  - I have approached her with the following individualized plan to manage  her diabetes and patient agrees:   -Patient was significant cognitive deficit, reported to be noncompliant at times with care. - she will continue to need intensive treatment with basal/bolus insulin in order for her to achieve and maintain control of diabetes to target.   -She is accompanied by her aide from group home where she is residing due to  her mental health problems and failure to care for self.   -Priority #1 in her care will be to avoid hypoglycemia.  Instruction was given to continue Basaglar 50 units daily at bedtime, continue NovoLog 10 units 3 times a day with meals  for pre-meal BG readings of 90-150mg /dl, plus patient specific correction dose for unexpected hyperglycemia above 150mg /dl, associated with strict monitoring of glucose 4 times a day-before meals and at bedtime. - she is warned not to take insulin without proper monitoring per orders. - Adjustment parameters are given to her for hypo and hyperglycemia in writing. - she is encouraged to  call clinic for blood glucose levels less than 70 or above 300 mg /dl. - she is advised to continue Tradjenta 5 mg p.o. daily, therapeutically suitable for patient .  - she is not a candidate for metformin, SGLT2 inhibitors due to concurrent renal insufficiency.  - Patient specific target  A1c;  LDL, HDL, Triglycerides, and  Waist Circumference were discussed in detail.  2) Blood Pressure /Hypertension: Her blood pressure is controlled to target. she is advised to continue her current medications including losartan 100 mg p.o. daily with breakfast .  3)  Lipids/Hyperlipidemia:   Review of her recent lipid panel showed  controlled  LDL at 78 .  she  is advised to continue simvastatin 20 mg p.o. daily at bedtime.  Side effects and precautions discussed with her.  4)  Weight/Diet:  Body mass index is 40.27 kg/m.  -   clearly complicating her diabetes care.  I discussed with her the fact that loss of 5 - 10% of her  current body weight will have the most impact on her diabetes management.  CDE Consult will be initiated . Exercise, and detailed carbohydrates information provided  -  detailed on discharge instructions.  5) Chronic Care/Health Maintenance:  -she  is on ACEI/ARB and Statin medications and  is encouraged to initiate and continue to follow up with Ophthalmology, Dentist,  Podiatrist at least yearly or according to recommendations, and advised to  stay away from smoking. I have recommended yearly flu vaccine and pneumonia vaccine at least every 5 years; moderate intensity exercise for up to 150 minutes weekly; and  sleep for at least 7 hours a day.  - she is  advised to maintain close follow up with [name], MD for primary care needs, as well as her other providers for optimal and coordinated care.  - Time spent on this patient care encounter:  35 min, of which > 50% was spent in  counseling and the rest reviewing her blood glucose logs , discussing her hypoglycemia and hyperglycemia episodes, reviewing her current and  previous labs / studies  ( including abstraction from other facilities) and medications  doses and developing a  long term treatment plan and documenting her care.   Please refer to Patient Instructions for Blood Glucose Monitoring and Insulin/Medications Dosing Guide"  in media tab for additional information. Please  also refer to " Patient Self Inventory" in the Media  tab for reviewed elements of pertinent patient history.  [place] participated in the discussions, expressed understanding, and voiced agreement  with the above plans.  All questions were answered to her satisfaction. she is encouraged to contact clinic should she have any questions or concerns prior to her return visit.    Follow up plan: - Return in about 4 months (around 06/01/2020) for Bring Meter and Logs- A1c in Office, Follow up with Pre-visit Labs.  Glade Lloyd, MD [facility] [address] [place], [place] [address] Phone: [phone]  Fax: [phone]    01/28/2020, 5:54 PM  This note was partially dictated with voice recognition software. Similar sounding words can be transcribed inadequately or may not  be corrected upon review.

## 2020-01-28 NOTE — Patient Instructions (Signed)

## 2020-01-28 NOTE — Telephone Encounter (Signed)
RX care called Butch Penny) and needs a call back about her RX that was sent in today after her visit. [phone]

## 2020-01-29 ENCOUNTER — Telehealth: Payer: Self-pay | Admitting: "Endocrinology

## 2020-01-29 DIAGNOSIS — E860 Dehydration: Secondary | ICD-10-CM | POA: Diagnosis not present

## 2020-01-29 DIAGNOSIS — E161 Other hypoglycemia: Secondary | ICD-10-CM | POA: Diagnosis not present

## 2020-01-29 DIAGNOSIS — R0902 Hypoxemia: Secondary | ICD-10-CM | POA: Diagnosis not present

## 2020-01-29 DIAGNOSIS — E162 Hypoglycemia, unspecified: Secondary | ICD-10-CM | POA: Diagnosis not present

## 2020-01-29 DIAGNOSIS — R410 Disorientation, unspecified: Secondary | ICD-10-CM | POA: Diagnosis not present

## 2020-01-29 NOTE — Telephone Encounter (Signed)
Spoke with Butch Penny. Went over insulin directions.

## 2020-01-29 NOTE — Telephone Encounter (Signed)
When she refuses to eat, she will not need NovoLog.

## 2020-01-29 NOTE — Telephone Encounter (Signed)
Kathryn Cook called and wants to know what you suggest when she refuses to eat? Please advise.

## 2020-01-29 NOTE — Telephone Encounter (Signed)
Patient's caregiver needs this in writing, can you give me something to fax.    [phone], call before I fax [phone]

## 2020-01-30 NOTE — Telephone Encounter (Signed)
Faxed

## 2020-02-04 ENCOUNTER — Encounter (HOSPITAL_COMMUNITY): Payer: Self-pay | Admitting: *Deleted

## 2020-02-04 ENCOUNTER — Telehealth: Payer: Self-pay | Admitting: *Deleted

## 2020-02-04 ENCOUNTER — Emergency Department (HOSPITAL_COMMUNITY)
Admission: EM | Admit: 2020-02-04 | Discharge: 2020-02-04 | Disposition: A | Discharge status: Home / Self Care | Payer: Medicare Other | Attending: Emergency Medicine | Admitting: Emergency Medicine

## 2020-02-04 DIAGNOSIS — Z794 Long term (current) use of insulin: Secondary | ICD-10-CM | POA: Diagnosis not present

## 2020-02-04 DIAGNOSIS — R531 Weakness: Secondary | ICD-10-CM | POA: Diagnosis not present

## 2020-02-04 DIAGNOSIS — E1122 Type 2 diabetes mellitus with diabetic chronic kidney disease: Secondary | ICD-10-CM | POA: Diagnosis not present

## 2020-02-04 DIAGNOSIS — Z7982 Long term (current) use of aspirin: Secondary | ICD-10-CM | POA: Insufficient documentation

## 2020-02-04 DIAGNOSIS — Z87891 Personal history of nicotine dependence: Secondary | ICD-10-CM | POA: Insufficient documentation

## 2020-02-04 DIAGNOSIS — I129 Hypertensive chronic kidney disease with stage 1 through stage 4 chronic kidney disease, or unspecified chronic kidney disease: Secondary | ICD-10-CM | POA: Diagnosis not present

## 2020-02-04 DIAGNOSIS — R197 Diarrhea, unspecified: Secondary | ICD-10-CM | POA: Insufficient documentation

## 2020-02-04 DIAGNOSIS — Z20828 Contact with and (suspected) exposure to other viral communicable diseases: Secondary | ICD-10-CM | POA: Diagnosis not present

## 2020-02-04 DIAGNOSIS — E860 Dehydration: Secondary | ICD-10-CM | POA: Diagnosis not present

## 2020-02-04 DIAGNOSIS — N184 Chronic kidney disease, stage 4 (severe): Secondary | ICD-10-CM | POA: Insufficient documentation

## 2020-02-04 DIAGNOSIS — R5381 Other malaise: Secondary | ICD-10-CM | POA: Diagnosis not present

## 2020-02-04 DIAGNOSIS — Z79899 Other long term (current) drug therapy: Secondary | ICD-10-CM | POA: Diagnosis not present

## 2020-02-04 DIAGNOSIS — K529 Noninfective gastroenteritis and colitis, unspecified: Secondary | ICD-10-CM

## 2020-02-04 LAB — CBC WITH DIFFERENTIAL/PLATELET
Abs Immature Granulocytes: 0.05 10*3/uL (ref 0.00–0.07)
Basophils Absolute: 0.1 10*3/uL (ref 0.0–0.1)
Basophils Relative: 1 %
Eosinophils Absolute: 0.1 10*3/uL (ref 0.0–0.5)
Eosinophils Relative: 1 %
HCT: 39.5 % (ref 36.0–46.0)
Hemoglobin: 12.1 g/dL (ref 12.0–15.0)
Immature Granulocytes: 1 %
Lymphocytes Relative: 22 %
Lymphs Abs: 2 10*3/uL (ref 0.7–4.0)
MCH: 27 pg (ref 26.0–34.0)
MCHC: 30.6 g/dL (ref 30.0–36.0)
MCV: 88.2 fL (ref 80.0–100.0)
Monocytes Absolute: 0.6 10*3/uL (ref 0.1–1.0)
Monocytes Relative: 7 %
Neutro Abs: 6.4 10*3/uL (ref 1.7–7.7)
Neutrophils Relative %: 68 %
Platelets: 211 10*3/uL (ref 150–400)
RBC: 4.48 MIL/uL (ref 3.87–5.11)
RDW: 15.4 % (ref 11.5–15.5)
WBC: 9.3 10*3/uL (ref 4.0–10.5)
nRBC: 0 % (ref 0.0–0.2)

## 2020-02-04 LAB — COMPREHENSIVE METABOLIC PANEL
ALT: 16 U/L (ref 0–44)
AST: 17 U/L (ref 15–41)
Albumin: 3.8 g/dL (ref 3.5–5.0)
Alkaline Phosphatase: 82 U/L (ref 38–126)
Anion gap: 10 (ref 5–15)
BUN: 49 mg/dL — ABNORMAL HIGH (ref 8–23)
CO2: 21 mmol/L — ABNORMAL LOW (ref 22–32)
Calcium: 9.2 mg/dL (ref 8.9–10.3)
Chloride: 106 mmol/L (ref 98–111)
Creatinine, Ser: 1.98 mg/dL — ABNORMAL HIGH (ref 0.44–1.00)
GFR calc Af Amer: 29 mL/min — ABNORMAL LOW (ref >60)
GFR calc non Af Amer: 25 mL/min — ABNORMAL LOW (ref >60)
Glucose, Bld: 132 mg/dL — ABNORMAL HIGH (ref 70–99)
Potassium: 4.2 mmol/L (ref 3.5–5.1)
Sodium: 137 mmol/L (ref 135–145)
Total Bilirubin: 0.6 mg/dL (ref 0.3–1.2)
Total Protein: 7.5 g/dL (ref 6.5–8.1)

## 2020-02-04 LAB — LIPASE, BLOOD: Lipase: 57 U/L — ABNORMAL HIGH (ref 11–51)

## 2020-02-04 MED ORDER — LOPERAMIDE HCL 2 MG PO CAPS: 2.0000 mg | ORAL_CAPSULE | Freq: Four times a day (QID) | ORAL | 0 refills | Status: DC | PRN

## 2020-02-04 NOTE — ED Provider Notes (Signed)
[facility] EMERGENCY DEPARTMENT Provider Note   CSN: [id number] Arrival date & time: 02/04/20  1259     History Chief Complaint  Patient presents with  . Diarrhea    Kathryn Cook is a 69 y.o. female.  She is here with a complaint of diarrhea.  She has had diarrhea for over 2 months sometimes 2 or 3 episodes a day.  Not associate with any bleeding.  No abdominal pain fevers chills nausea vomiting.  She is not sure if she is taking anything for it.  She was here last month for same and was unable to give a stool sample referred back to her doctor.  The history is provided by the patient.  Diarrhea Quality:  Semi-solid Severity:  Unable to specify Onset quality:  Gradual Number of episodes:  2-3 per day Duration:  2 months Timing:  Intermittent Progression:  Unchanged Relieved by:  Nothing Worsened by:  Nothing Ineffective treatments:  None tried Associated symptoms: no abdominal pain, no chills, no recent cough, no fever, no headaches and no vomiting   Risk factors: no recent antibiotic use and no sick contacts        Past Medical History:  Diagnosis Date  . Bronchitis   . CVA (cerebral infarction)   . Diabetes mellitus without complication ([place])   . Hyperlipidemia   . Hypertension   . Memory loss   . Stroke ([place])   . Substance abuse ([place])    alcohol, but patient states she doesn't drink that much anymore    Patient Active Problem List   Diagnosis Date Noted  . Mixed hyperlipidemia 05/17/2019  . Essential hypertension, benign 05/17/2019  . Healthcare maintenance 03/02/2016  . Type II diabetes mellitus with neurological manifestations ([place]) 06/12/2015  . Moderate dementia without behavioral disturbance ([place]) 12/06/2013  . Dementia ([place]) 10/18/2013  . Tobacco abuse 10/18/2013  . HTN (hypertension), malignant 10/18/2013  . Uncontrolled type 2 diabetes mellitus with stage 4 chronic kidney disease ([place]) 10/01/2013  . Cough 08/27/2013  . Tobacco use disorder  08/27/2013  . H/O: CVA (cerebrovascular accident) 08/27/2013  . Vascular dementia ([place]) 08/27/2013  . Diastolic congestive heart failure, NYHA class 1 ([place]) 06/13/2013  . LVH (left ventricular hypertrophy) due to hypertensive disease 06/13/2013  . CVA (cerebral infarction) 06/10/2013  . History of alcohol use 05/31/2013  . HTN (hypertension) 05/31/2013  . Memory loss 05/31/2013  . Kidney disease, chronic, stage III (GFR 30-59 ml/min) 05/31/2013    Past Surgical History:  Procedure Laterality Date  . COLONOSCOPY N/A 08/22/2018   Procedure: COLONOSCOPY;  Surgeon: Daneil Dolin, MD;  Location: [facility];  Service: Endoscopy;  Laterality: N/A;  10:30  . POLYPECTOMY  08/22/2018   Procedure: POLYPECTOMY;  Surgeon: Daneil Dolin, MD;  Location: [facility];  Service: Endoscopy;;  colon     OB History    Gravida  1   Para  1   Term  1   Preterm      AB      Living  1     SAB      TAB      Ectopic      Multiple      Live Births              Family History  Problem Relation Age of Onset  . Other Mother        unknown  . Other Father        unknown  Social History   Tobacco Use  . Smoking status: Former Smoker    Packs/day: 0.25    Years: 45.00    Pack years: 11.25    Types: Cigarettes    Quit date: 09/27/2013    Years since quitting: 6.3  . Smokeless tobacco: Never Used  Substance Use Topics  . Alcohol use: No    Comment: occasional  . Drug use: No    Types: Marijuana    Comment: last used 3 months ago    Home Medications Prior to Admission medications   Medication Sig Start Date End Date Taking? Authorizing Provider  amLODipine (NORVASC) 5 MG tablet Take 5 mg by mouth daily.    [provider]  aspirin EC 325 MG tablet Take 325 mg by mouth daily.    [provider]  carvedilol (COREG) 12.5 MG tablet Take 12.5 mg by mouth 2 (two) times daily with a meal.    [provider]  COMBIVENT RESPIMAT 20-100 MCG/ACT  AERS respimat  06/05/19   [provider]  donepezil (ARICEPT) 10 MG tablet Take 10 mg by mouth at bedtime.    [provider]  furosemide (LASIX) 40 MG tablet Take 40 mg by mouth daily.    [provider]  glucose blood (EASYMAX TEST) test strip Use as instructed 05/17/19   Cassandria Anger, MD  Insulin Glargine [facility] KWIKPEN) 100 UNIT/ML SOPN INJECT 50 UNITS SUBCUTANEOUSLY AT BEDTIME. 07/17/19   Nida, Marella Chimes, MD  linagliptin (TRADJENTA) 5 MG TABS tablet Take 5 mg by mouth daily.    [provider]  losartan (COZAAR) 100 MG tablet Take 100 mg by mouth daily.    [provider]  methenamine (HIPREX) 1 g tablet Take 1 g by mouth 2 (two) times daily with a meal.    [provider]  NOVOLOG FLEXPEN 100 UNIT/ML FlexPen INJECT SQ SLIDING SCALE 3 TIMES DAILY WITH MEALS 90-150=10U:151-200=11U:20 1-250=12U:251-300=13U:301 -350=14U:351-400=15U:ABOV E400 CALL MD 11/30/19   Cassandria Anger, MD  PARoxetine (PAXIL) 20 MG tablet Take 20 mg by mouth daily.    [provider]  Propylene Glycol (SYSTANE COMPLETE OP) Apply to eye 3 (three) times daily.    [provider]  simvastatin (ZOCOR) 20 MG tablet Take 20 mg by mouth daily.    [provider]  umeclidinium bromide (INCRUSE ELLIPTA) 62.5 MCG/INH AEPB Inhale 1 puff into the lungs daily.    [provider]    Allergies    Ace inhibitors and Diovan [valsartan]  Review of Systems   Review of Systems  Constitutional: Negative for chills and fever.  HENT: Negative for sore throat.   Eyes: Negative for visual disturbance.  Respiratory: Negative for shortness of breath.   Cardiovascular: Negative for chest pain.  Gastrointestinal: Positive for diarrhea. Negative for abdominal pain and vomiting.  Genitourinary: Negative for dysuria.  Musculoskeletal: Negative for neck pain.  Skin: Negative for rash.  Neurological: Negative for headaches.     Physical Exam Updated Vital Signs BP 120/66   Pulse 70   Temp 98.3 F (36.8 C)   Resp 20   SpO2 97%   Physical Exam Vitals and nursing note reviewed.  Constitutional:      General: She is not in acute distress.    Appearance: She is well-developed.  HENT:     Head: Normocephalic and atraumatic.  Eyes:     Conjunctiva/sclera: Conjunctivae normal.  Cardiovascular:     Rate and Rhythm: Normal rate and regular rhythm.  Heart sounds: No murmur.  Pulmonary:     Effort: Pulmonary effort is normal. No respiratory distress.     Breath sounds: Normal breath sounds.  Abdominal:     Palpations: Abdomen is soft.     Tenderness: There is no abdominal tenderness.  Musculoskeletal:        General: No deformity or signs of injury.     Cervical back: Neck supple.  Skin:    General: Skin is warm and dry.  Neurological:     General: No focal deficit present.     Mental Status: She is alert.     ED Results / Procedures / Treatments   Labs (all labs ordered are listed, but only abnormal results are displayed) Labs Reviewed  COMPREHENSIVE METABOLIC PANEL - Abnormal; Notable for the following components:      Result Value   CO2 21 (*)    Glucose, Bld 132 (*)    BUN 49 (*)    Creatinine, Ser 1.98 (*)    GFR calc non Af Amer 25 (*)    GFR calc Af Amer 29 (*)    All other components within normal limits  LIPASE, BLOOD - Abnormal; Notable for the following components:   Lipase 57 (*)    All other components within normal limits  GASTROINTESTINAL PANEL BY PCR, STOOL (REPLACES STOOL CULTURE)  CBC WITH DIFFERENTIAL/PLATELET    EKG None  Radiology No results found.  Procedures Procedures (including critical care time)  Medications Ordered in ED Medications - No data to display  ED Course  I have reviewed the triage vital signs and the nursing notes.  Pertinent labs & imaging results that were available during my care of the patient were reviewed by me and considered  in my medical decision making (see chart for details).  Clinical Course as of Feb 04 1053  Mon Feb 04, 2020  1935 Patient has been here for over 6 hours and unable to produce any stool for stool studies.  Will discharge and have follow-up with primary care doctor and GI.  Started on Imodium.   [MB]    Clinical Course User Index [MB] Hayden Rasmussen, MD   MDM Rules/Calculators/A&P                     This patient complains of diarrhea; this involves an extensive number of treatment Options and is a complaint that carries with it a high risk of complications and Morbidity. The differential includes infectious diarrhea, malabsorption, medication side effect, metabolic derangement dehydration  I ordered, reviewed and interpreted labs, which included CBC with a normal white count normal hemoglobin, chemistries with a mildly low bicarb elevated glucose elevated creatinine Previous records obtained and reviewed in epic including recent ED visit in April for similar After the interventions stated above, I reevaluated the patient and found the patient to be stable.  She has had no bowel movements here.  Unable to provide a stool sample.  Recommended that she follow-up with GI and will start her on some Imodium.   Final Clinical Impression(s) / ED Diagnoses Final diagnoses:  Chronic diarrhea    Rx / DC Orders ED Discharge Orders         Ordered    loperamide (IMODIUM) 2 MG capsule  4 times daily PRN     02/04/20 1935           Hayden Rasmussen, MD 02/05/20 1056

## 2020-02-04 NOTE — Telephone Encounter (Signed)
Pt consented to a virtual visit. 

## 2020-02-04 NOTE — ED Triage Notes (Signed)
Sent in for evaluation of diarrhea, states she has had diarrhea for quite some time

## 2020-02-04 NOTE — Telephone Encounter (Signed)
[facility], you are scheduled for a virtual visit with your provider today.  Just as we do with appointments in the office, we must obtain your consent to participate.  Your consent will be active for this visit and any virtual visit you may have with one of our providers in the next 365 days.  If you have a MyChart account, I can also send a copy of this consent to you electronically.  All virtual visits are billed to your insurance company just like a traditional visit in the office.  As this is a virtual visit, video technology does not allow for your provider to perform a traditional examination.  This may limit your provider's ability to fully assess your condition.  If your provider identifies any concerns that need to be evaluated in person or the need to arrange testing such as labs, EKG, etc, we will make arrangements to do so.  Although advances in technology are sophisticated, we cannot ensure that it will always work on either your end or our end.  If the connection with a video visit is poor, we may have to switch to a telephone visit.  With either a video or telephone visit, we are not always able to ensure that we have a secure connection.   I need to obtain your verbal consent now.   Are you willing to proceed with your visit today?

## 2020-02-04 NOTE — Discharge Instructions (Signed)
You were seen in the emergency department for chronic diarrhea.  You had lab work done that did not show serious findings.  You were unable to produce a stool sample here.  You should take Imodium as needed for diarrhea.  Please contact GI for close follow-up.  Return to the emergency department for any worsening or concerning symptoms

## 2020-02-06 ENCOUNTER — Encounter: Payer: Self-pay | Admitting: Podiatrist

## 2020-02-06 ENCOUNTER — Ambulatory Visit (INDEPENDENT_AMBULATORY_CARE_PROVIDER_SITE_OTHER): Payer: Medicare Other | Admitting: Podiatrist

## 2020-02-06 ENCOUNTER — Other Ambulatory Visit: Payer: Self-pay

## 2020-02-06 VITALS — Temp 97.3°F

## 2020-02-06 DIAGNOSIS — E1142 Type 2 diabetes mellitus with diabetic polyneuropathy: Secondary | ICD-10-CM | POA: Diagnosis not present

## 2020-02-06 DIAGNOSIS — L89620 Pressure ulcer of left heel, unstageable: Secondary | ICD-10-CM

## 2020-02-06 NOTE — Patient Instructions (Signed)
Keep all pressure off the heels with either a pillow under the leg allowing the heel to float or with a heel cradle.   Massage barrier cream onto heel sore or aquaphor daily to twice daily. Make sure to monitor for any increased warmth or redness or drainage-  Please call if you notice any of these signs.   Pressure Injury  A pressure injury is damage to the skin and underlying tissue that results from pressure being applied to an area of the body. It often affects people who must spend a long time in a bed or chair because of a medical condition. Pressure injuries usually occur:  Over bony parts of the body, such as the tailbone, shoulders, elbows, hips, heels, spine, ankles, and back of the head.  Under medical devices that make contact with the body, such as respiratory equipment, stockings, tubes, and splints. Pressure injuries start as reddened areas on the skin and can lead to pain and an open wound. What are the causes? This condition is caused by frequent or constant pressure to an area of the body. Decreased blood flow to the skin can eventually cause the skin tissue to die and break down, causing a wound. What increases the risk? You are more likely to develop this condition if you:  Are in the hospital or an extended care facility.  Are bedridden or in a wheelchair.  Have an injury or disease that keeps you from: ? Moving normally. ? Feeling pain or pressure.  Have a condition that: ? Makes you sleepy or less alert. ? Causes poor blood flow.  Need to wear a medical device.  Have poor control of your bladder or bowel functions (incontinence).  Have poor nutrition (malnutrition). If you are at risk for pressure injuries, your health care provider may recommend certain types of mattresses, mattress covers, pillows, cushions, or boots to help prevent them. These may include products filled with air, foam, gel, or sand. What are the signs or symptoms? Symptoms of this  condition depend on the severity of the injury. Symptoms may include:  Red or dark areas of the skin.  Pain, warmth, or a change of skin texture.  Blisters.  An open wound. How is this diagnosed? This condition is diagnosed with a medical history and physical exam. You may also have tests, such as:  Blood tests.  Imaging tests.  Blood flow tests. Your pressure injury will be staged based on its severity. Staging is based on:  The depth of the tissue injury, including whether there is exposure of muscle, bone, or tendon.  The cause of the pressure injury. How is this treated? This condition may be treated by:  Relieving or redistributing pressure on your skin. This includes: ? Frequently changing your position. ? Avoiding positions that caused the wound or that can make the wound worse. ? Using specific bed mattresses, chair cushions, or protective boots. ? Moving medical devices from an area of pressure, or placing padding between the skin and the device. ? Using foams, creams, or powders to prevent rubbing (friction) on the skin.  Keeping your skin clean and dry. This may include using a skin cleanser or skin barrier as told by your health care provider.  Cleaning your injury and removing any dead tissue from the wound (debridement).  Placing a bandage (dressing) over your injury.  Using medicines for pain or to prevent or treat infection. Surgery may be needed if other treatments are not working or if your injury  is very deep. Follow these instructions at home: Wound care  Follow instructions from your health care provider about how to take care of your wound. Make sure you: ? Wash your hands with soap and water before and after you change your bandage (dressing). If soap and water are not available, use hand sanitizer. ? Change your dressing as told by your health care provider.  Check your wound every day for signs of infection. Have a caregiver do this for you if  you are not able. Check for: ? Redness, swelling, or increased pain. ? More fluid or blood. ? Warmth. ? Pus or a bad smell. Skin care  Keep your skin clean and dry. Gently pat your skin dry.  Do not rub or massage your skin.  You or a caregiver should check your skin every day for any changes in color or any new blisters or sores (ulcers). Medicines  Take over-the-counter and prescription medicines only as told by your health care provider.  If you were prescribed an antibiotic medicine, take or apply it as told by your health care provider. Do not stop using the antibiotic even if your condition improves. Reducing and redistributing pressure  Do not lie or sit in one position for a long time. Move or change position every 1-2 hours, or as told by your health care provider.  Use pillows or cushions to reduce pressure. Ask your health care provider to recommend cushions or pads for you. General instructions   Eat a healthy diet that includes lots of protein.  Drink enough fluid to keep your urine pale yellow.  Be as active as you can every day. Ask your health care provider to suggest safe exercises or activities.  Do not abuse drugs or alcohol.  Do not use any products that contain nicotine or tobacco, such as cigarettes, e-cigarettes, and chewing tobacco. If you need help quitting, ask your health care provider.  Keep all follow-up visits as told by your health care provider. This is important. Contact a health care provider if:  You have: ? A fever or chills. ? Pain that is not helped by medicine. ? Any changes in skin color. ? New blisters or sores. ? Pus or a bad smell coming from your wound. ? Redness, swelling, or pain around your wound. ? More fluid or blood coming from your wound.  Your wound does not improve after 1-2 weeks of treatment. Summary  A pressure injury is damage to the skin and underlying tissue that results from pressure being applied to an area  of the body.  Do not lie or sit in one position for a long time. Your health care provider may advise you to move or change position every 1-2 hours.  Follow instructions from your health care provider about how to take care of your wound.  Keep all follow-up visits as told by your health care provider. This is important. This information is not intended to replace advice given to you by your health care provider. Make sure you discuss any questions you have with your health care provider. Document Revised: 04/12/2018 Document Reviewed: 04/12/2018 Elsevier Patient Education  [place].

## 2020-02-06 NOTE — Progress Notes (Signed)
    Chief Complaint  Patient presents with  . Wound Check    L back of heel.. Daughter stated, "I noticed it two days ago. No bleeding/drainage/pain. She's been in bed for 6 weeks because of diarrhea. I think her foot has been rubbing against the sheets".     HPI: Patient is 69 y.o. female who presents today for concern over a dark area on the left heel.  Patient's daughter states that she noticed it approximately 2 days ago.  She also relates her mom has been having to rest a lot during the day due to some gastro-intestinal issues.  No treatments have been tried. Review of Systems No fevers, chills, nausea, muscle aches, no difficulty breathing, no calf pain, no chest pain or shortness of breath.   Physical Exam  GENERAL APPEARANCE: Alert, conversant. Appropriately groomed. No acute distress.   VASCULAR: Pedal pulses palpable DP and PT bilateral at 1/4.  Capillary refill time is immediate to all digits,  Proximal to distal cooling it warm to warm.    NEUROLOGIC: sensation is decreased epicritically and protectively to 5.07 monofilament at 2/5 sites bilateral.  Light touch is decreased bilateral, vibratory sensation absent bilateral  MUSCULOSKELETAL: acceptable muscle strength, tone and stability bilateral.  No gross boney pedal deformities noted.  No pain, crepitus or limitation noted with foot and ankle range of motion bilateral.   DERMATOLOGIC: skin is warm, supple, and dry.  Well-circumscribed darkened pressure sore on the posterior aspect of the left heel measuring 2.5 cm in diameter with a central area measuring 1 cm in diameter is noted.  No warmth, no redness, no drainage, no sign of infection noted.  No streaking or lymphangitis seen.  Assessment    ICD-10-CM   1. Pressure injury of left heel, unstageable ([place])  L89.620   2. Diabetic polyneuropathy associated with type 2 diabetes mellitus ([place])  E11.42      Plan Discussed treatment the importance of keeping all weight off of  this left heel and wrote a prescription for a heel cradle to be used in the bed.  Also discussed putting a pillow under the leg allowing the heel to float off of the supporting surface.  Recommended applying a barrier cream or Aquaphor to the area and monitoring for any increased heat, any increased redness or any drainage and to call immediately if any of these are noted..  No debridement was performed today we will watch the area closely and leave the intact skin.  I will see the patient back in 2 weeks for a recheck.

## 2020-02-07 DIAGNOSIS — R197 Diarrhea, unspecified: Secondary | ICD-10-CM | POA: Diagnosis not present

## 2020-02-07 NOTE — Progress Notes (Signed)
Referring Provider: [name], MD  Primary Care Physician:  [name], MD  Primary GI: Dr. Gala Romney  Patient Location: [facility]   Provider Location: [facility] office   Reason for Visit: Diarrhea   Persons present on the virtual encounter, with roles: Aliene Altes, PA-C (Provider), Kathryn Cook (Patient), Bernell (administrator of facility)    Total time (minutes) spent on medical discussion: 20 minutes  Virtual Visit via Telephone Note Due to COVID-19, visit is conducted virtually and was requested by patient.   I connected with Kathryn Cook on 02/08/20 at  8:30 AM EDT by video and verified that I am speaking with the correct person using two identifiers.   I discussed the limitations, risks, security and privacy concerns of performing an evaluation and management service by video and the availability of in person appointments. I also discussed with the patient that there may be a patient responsible charge related to this service. The patient expressed understanding and agreed to proceed.  Chief Complaint  Patient presents with  . Diarrhea    watery, going on x6 weeks, started having more formed stool yesterday; Imodium seems to help more than Lomotil     History of Present Illness: Kathryn Cook is a 69 y.o. female with medical history of dementia, diabetes, hypertension, hyperlipidemia, and CVA presenting today at the request of [name], MD for irregular bowel movements/diarrhea. We have only ever seen patient when she had a colonoscopy in November 2019 which was for screening purposes. TCS with two tubular adenomas with recommendations to repeat in 2024.   Pre chart review, patient has been seen in the ED twice for diarrhea recently. 1st on 4/12 and 2nd on 5/10. On 4/12, patient had recently been treated for UTI and finished her antibiotics.  She was without any significant complaint but did report having diarrhea.  Her facility reported she had  not been eating well.  When asked about this, patient stated "because I have not wanted to."  Labs with glucose 235, creatinine 2.19 (close to baseline), urinalysis without significant findings.  Patient tolerated p.o. intake and was discharged.  On 5/10, patient reported 56-month history of sometimes 2-3 BMs a day.  No other significant GI symptoms.  No significant findings on CBC or CMP.  Lipase minimally elevated at 57.  She was unable to provide a stool sample.  She was discharged on Imodium and advised to follow-up with PCP and GI.  Reviewed information included in patient's referral.  Follow-up note with PCP on 01/09/2020 following visit to the ED.  States she continued without any significant diarrhea.  She was advised to discontinue laxative.  Today:  Administrator Bernell helps provide history.  Diarrhea x 6 weeks. Watery. Everything she was eating was going straight through her. Was having 2 BMs after each meal. Was refusing lunch. About 4 stools a day. No blood in the stool. No black stool. Green. Occasional mucous/slimy. No abdominal pain. Was on antibiotics prior to diarrhea. Can't remember name of antibiotic. No other residents with similar symptoms. Some nocturnal stools. Has been taking 1-2 imodium a day since last ED visit. Had 1 imodium yesterday. None so far today. No BM this morning.  Stools becoming formed yesterday.  Prior to 6 weeks ago, she had formed stools. About 1-2 a day. Has gas.   No nausea or vomiting. Has been losing weight due to not being able to keep anything in. Lost about 20 lbs over the last 6 weeks. Reports she also had  quite a bit of fluid on her before all this started which has also resolved.   No yogurt in the last few days. Used to have yogurt a few times a week.  Cheese this morning. Since diarrhea, she hasn't been having milk. Ice cream occasionally.   No known history of celiac disease or thyroid abnormalities. Glucose not ideally controlled. Administrator  states patient is not compliant with diet. She like to drink soda and eat sweets. No history of IBS.   No NSAIDs other than baby aspirin.   Administrator states Dr. Legrand Rams ordered C. Diff yesterday. Took stool sample to the lab yesterday but not sure if a sample will work as stool was more formed.     No fever or chills. Occasional lightheadedness. Not drinking enough fluids. Urine is pale yellow. No CP or heart palpitations. Occasional SOB with exertion none at rest. No significant cough.   Past Medical History:  Diagnosis Date  . Bronchitis   . CVA (cerebral infarction)   . Diabetes mellitus without complication ([place])   . Hyperlipidemia   . Hypertension   . Memory loss   . Stroke ([place])   . Substance abuse ([place])    alcohol, but patient states she doesn't drink that much anymore     Past Surgical History:  Procedure Laterality Date  . COLONOSCOPY N/A 08/22/2018   Procedure: COLONOSCOPY;  Surgeon: Daneil Dolin, MD;  two tubular adenomas with recommendations to repeat in 2024.   Marland Kitchen POLYPECTOMY  08/22/2018   Procedure: POLYPECTOMY;  Surgeon: Daneil Dolin, MD;  Location: [facility];  Service: Endoscopy;;  colon     Current Meds  Medication Sig  . albuterol (VENTOLIN HFA) 108 (90 Base) MCG/ACT inhaler Inhale into the lungs every 6 (six) hours as needed for wheezing or shortness of breath.  Marland Kitchen amLODipine (NORVASC) 5 MG tablet Take 5 mg by mouth daily.  Marland Kitchen aspirin EC 325 MG tablet Take 325 mg by mouth daily.  . carvedilol (COREG) 12.5 MG tablet Take 12.5 mg by mouth 2 (two) times daily with a meal.  . COMBIVENT RESPIMAT 20-100 MCG/ACT AERS respimat as needed.   . donepezil (ARICEPT) 10 MG tablet Take 10 mg by mouth at bedtime.  . furosemide (LASIX) 40 MG tablet Take 40 mg by mouth daily.  Marland Kitchen glucose blood (EASYMAX TEST) test strip Use as instructed  . Insulin Glargine (BASAGLAR KWIKPEN) 100 UNIT/ML SOPN INJECT 50 UNITS SUBCUTANEOUSLY AT BEDTIME.  Marland Kitchen ketoconazole (NIZORAL) 2 %  cream Apply 1 application topically daily.  Marland Kitchen linagliptin (TRADJENTA) 5 MG TABS tablet Take 5 mg by mouth daily.  Marland Kitchen loperamide (IMODIUM) 2 MG capsule Take 1 capsule (2 mg total) by mouth 4 (four) times daily as needed for diarrhea or loose stools.  Marland Kitchen losartan (COZAAR) 100 MG tablet Take 100 mg by mouth daily.  . Multiple Vitamin (MULTIVITAMIN) tablet Take 1 tablet by mouth daily.  Marland Kitchen NOVOLOG FLEXPEN 100 UNIT/ML FlexPen INJECT SQ SLIDING SCALE 3 TIMES DAILY WITH MEALS 90-150=10U:151-200=11U:20 1-250=12U:251-300=13U:301 -350=14U:351-400=15U:ABOV E400 CALL MD  . PARoxetine (PAXIL) 20 MG tablet Take 20 mg by mouth daily.  Marland Kitchen Propylene Glycol (SYSTANE COMPLETE OP) Apply to eye 3 (three) times daily.  . simvastatin (ZOCOR) 20 MG tablet Take 20 mg by mouth daily.  Marland Kitchen umeclidinium bromide (INCRUSE ELLIPTA) 62.5 MCG/INH AEPB Inhale 1 puff into the lungs daily.     Family History  Problem Relation Age of Onset  . Other Mother  unknown  . Other Father        unknown  . Colon cancer Neg Hx     Social History   Socioeconomic History  . Marital status: Single    Spouse name: Not on file  . Number of children: 1  . Years of education: 61  . Highest education level: Not on file  Occupational History  . Occupation: unemployed  Tobacco Use  . Smoking status: Former Smoker    Packs/day: 0.25    Years: 45.00    Pack years: 11.25    Types: Cigarettes    Quit date: 09/27/2013    Years since quitting: 6.3  . Smokeless tobacco: Never Used  Substance and Sexual Activity  . Alcohol use: No    Comment: occasional  . Drug use: No    Types: Marijuana    Comment: last used 3 months ago  . Sexual activity: Yes  Other Topics Concern  . Not on file  Social History Narrative   Patient is single and lives at [facility] family Care home.   Patient has one child.   Patient is right-handed.   Patient drinks two cups of coffee daily.   Patient has an 11 th grade education.   Social Determinants  of Health   Financial Resource Strain:   . Difficulty of Paying Living Expenses:   Food Insecurity:   . Worried About Charity fundraiser in the Last Year:   . Arboriculturist in the Last Year:   Transportation Needs:   . Film/video editor (Medical):   Marland Kitchen Lack of Transportation (Non-Medical):   Physical Activity:   . Days of Exercise per Week:   . Minutes of Exercise per Session:   Stress:   . Feeling of Stress :   Social Connections:   . Frequency of Communication with Friends and Family:   . Frequency of Social Gatherings with Friends and Family:   . Attends Religious Services:   . Active Member of Clubs or Organizations:   . Attends Archivist Meetings:   Marland Kitchen Marital Status:      Review of Systems: Gen: See HPI CV: See HPI Resp: See HPI GI: see HPI Heme: See HPI  Observations/Objective: Video quality is poor. No distress. Alert and oriented. Pleasant. Normal mood and affect. Unable to perform complete physical exam due to video encounter.   Assessment and Plan: 69 year old female with history of dementia, diabetes, HTN, HLD, CVA, and adenomatous colon polyps due for colonoscopy in 2024 who resides at [facility] presenting with chief complaint of diarrhea.  Diarrhea: Postprandial watery diarrhea x6 weeks.  Occasional mucus/slimy appearance.  Occasional nocturnal stools.  Treated with antibiotics for UTI prior to diarrhea onset.  Denies sick contacts.  No nausea, vomiting, abdominal pain, BRBPR, or melena.  Reports weight loss due to foods running straight through her as well as loss of excess fluid/edema.  Recently started on Imodium after being seen in the ED on 5/10.  Taking 1-2 Imodium daily with stools becoming formed yesterday.  No Imodium today.  No BM today.  Baseline prior to diarrhea onset was 1-2 BMs daily.  No known history of IBS, celiac disease, or thyroid abnormalities.  Per administrator, diabetes not ideally controlled.  Differential is broad  and includes infectious/self resolved diarrhea, postinfectious IBS, pancreatic insufficiency, celiac disease, thyroid abnormalities, diabetic enteropathy, and SIBO.   Plan: Stop Imodium for now as stools have become more formed and she has not had  a BM today.  If diarrhea returns, she is to complete C. difficile and GI pathogen panel. Check TSH, IgA total, and TTG IgA Follow lactose-free diet or take Lactaid tablets prior to consuming dairy products. Handout provided.  Follow a low-fat diet.  Avoid fried, fatty, greasy foods.  All meats should be lean and baked, boiled, or broiled. Discussed the importance of keeping good control of her blood sugars. We will touch base with her once her labs result.   Plan follow-up in 3 months.   Follow Up Instructions: Follow-up in 3 months.  Advised to call questions or concerns prior.   I discussed the assessment and treatment plan with the patient. The patient was provided an opportunity to ask questions and all were answered. The patient agreed with the plan and demonstrated an understanding of the instructions.   The patient was advised to call back or seek an in-person evaluation if the symptoms worsen or if the condition fails to improve as anticipated.  I provided 20 minutes of video-face-to-face time during this encounter.  Aliene Altes, PA-C [facility]

## 2020-02-08 ENCOUNTER — Encounter: Payer: Self-pay | Admitting: Gastroenterology

## 2020-02-08 ENCOUNTER — Telehealth (INDEPENDENT_AMBULATORY_CARE_PROVIDER_SITE_OTHER): Payer: Medicare Other | Admitting: Gastroenterology

## 2020-02-08 ENCOUNTER — Other Ambulatory Visit: Payer: Self-pay

## 2020-02-08 DIAGNOSIS — R197 Diarrhea, unspecified: Secondary | ICD-10-CM | POA: Diagnosis not present

## 2020-02-08 NOTE — Patient Instructions (Addendum)
Please have labs and stool studies completed.  We are checking your thyroid function as well as for celiac disease.  We are also checking for any infection in your stool.  I recommend you hold off on the Imodium for now to see if your diarrhea returns.  If it does, please complete the stool studies.  I recommend you follow a lactose-free diet or take Lactaid tablets prior to consuming any dairy products.  You should also follow a low-fat diet.  Avoid fried, fatty, greasy foods.  All meats should be lean (poultry or fish) and baked, boiled, or broiled.  It is very important that you keep good control of your blood sugars and avoid sugary sweetened beverages and foods as uncontrolled diabetes can also contribute to diarrhea.  We will plan to follow-up with you in 3 months.  We will be in touch with you with your lab results in the interim.  Do not hesitate to call if you have any questions or concerns.  Aliene Altes, PA-C [facility]   Lactose-Free Diet, Adult If you have lactose intolerance, you are not able to digest lactose. Lactose is a natural sugar found mainly in dairy milk and dairy products. You may need to avoid all foods and beverages that contain lactose. A lactose-free diet can help you do this. Which foods have lactose? Lactose is found in dairy milk and dairy products, such as:  Yogurt.  Cheese.  Butter.  Margarine.  Sour cream.  Cream.  Whipped toppings and nondairy creamers.  Ice cream and other dairy-based desserts. Lactose is also found in foods or products made with dairy milk or milk ingredients. To find out whether a food contains dairy milk or a milk ingredient, look at the ingredients list. Avoid foods with the statement "May contain milk" and foods that contain:  Milk powder.  Whey.  Curd.  Caseinate.  Lactose.  Lactalbumin.  Lactoglobulin. What are alternatives to dairy milk and foods made with milk products?  Lactose-free  milk.  Soy milk with added calcium and vitamin D.  Almond milk, coconut milk, rice milk, or other nondairy milk alternatives with added calcium and vitamin D. Note that these are low in protein.  Soy products, such as soy yogurt, soy cheese, soy ice cream, and soy-based sour cream.  Other nut milk products, such as almond yogurt, almond cheese, cashew yogurt, cashew cheese, cashew ice cream, coconut yogurt, and coconut ice cream. What are tips for following this plan?  Do not consume foods, beverages, vitamins, minerals, or medicines containing lactose. Read ingredient lists carefully.  Look for the words "lactose-free" on labels.  Use lactase enzyme drops or tablets as directed by your health care provider.  Use lactose-free milk or a milk alternative, such as soy milk or almond milk, for drinking and cooking.  Make sure you get enough calcium and vitamin D in your diet. A lactose-free eating plan can be lacking in these important nutrients.  Take calcium and vitamin D supplements as directed by your health care provider. Talk to your health care provider about supplements if you are not able to get enough calcium and vitamin D from food. What foods can I eat?  Fruits All fresh, canned, frozen, or dried fruits that are not processed with lactose. Vegetables All fresh, frozen, and canned vegetables without cheese, cream, or butter sauces. Grains Any that are not made with dairy milk or dairy products. Meats and other proteins Any meat, fish, poultry, and other protein sources that  are not made with dairy milk or dairy products. Soy cheese and yogurt. Fats and oils Any that are not made with dairy milk or dairy products. Beverages Lactose-free milk. Soy, rice, or almond milk with added calcium and vitamin D. Fruit and vegetable juices. Sweets and desserts Any that are not made with dairy milk or dairy products. Seasonings and condiments Any that are not made with dairy milk or  dairy products. Calcium Calcium is found in many foods that contain lactose and is important for bone health. The amount of calcium you need depends on your age:  Adults younger than 50 years: 1,000 mg of calcium a day.  Adults older than 50 years: 1,200 mg of calcium a day. If you are not getting enough calcium, you may get it from other sources, including:  Orange juice with calcium added. There are 300-350 mg of calcium in 1 cup of orange juice.  Calcium-fortified soy milk. There are 300-400 mg of calcium in 1 cup of calcium-fortified soy milk.  Calcium-fortified rice or almond milk. There are 300 mg of calcium in 1 cup of calcium-fortified rice or almond milk.  Calcium-fortified breakfast cereals. There are 100-1,000 mg of calcium in calcium-fortified breakfast cereals.  Spinach, cooked. There are 145 mg of calcium in  cup of cooked spinach.  Edamame, cooked. There are 130 mg of calcium in  cup of cooked edamame.  Collard greens, cooked. There are 125 mg of calcium in  cup of cooked collard greens.  Kale, frozen or cooked. There are 90 mg of calcium in  cup of cooked or frozen kale.  Almonds. There are 95 mg of calcium in  cup of almonds.  Broccoli, cooked. There are 60 mg of calcium in 1 cup of cooked broccoli. The items listed above may not be a complete list of recommended foods and beverages. Contact a dietitian for more options. What foods are not recommended? Fruits None, unless they are made with dairy milk or dairy products. Vegetables None, unless they are made with dairy milk or dairy products. Grains Any grains that are made with dairy milk or dairy products. Meats and other proteins None, unless they are made with dairy milk or dairy products. Dairy All dairy products, including milk, goat's milk, buttermilk, kefir, acidophilus milk, flavored milk, evaporated milk, condensed milk, dulce de Bruno, eggnog, yogurt, cheese, and cheese spreads. Fats and  oils Any that are made with milk or milk products. Margarines and salad dressings that contain milk or cheese. Cream. Half and half. Cream cheese. Sour cream. Chip dips made with sour cream or yogurt. Beverages Hot chocolate. Cocoa with lactose. Instant iced teas. Powdered fruit drinks. Smoothies made with dairy milk or yogurt. Sweets and desserts Any that are made with milk or milk products. Seasonings and condiments Chewing gum that has lactose. Spice blends if they contain lactose. Artificial sweeteners that contain lactose. Nondairy creamers. The items listed above may not be a complete list of foods and beverages to avoid. Contact a dietitian for more information. Summary  If you are lactose intolerant, it means that you have a hard time digesting lactose, a natural sugar found in milk and milk products.  Following a lactose-free diet can help you manage this condition.  Calcium is important for bone health and is found in many foods that contain lactose. Talk with your health care provider about other sources of calcium. This information is not intended to replace advice given to you by your health care provider. Make  sure you discuss any questions you have with your health care provider. Document Revised: 10/11/2017 Document Reviewed: 10/11/2017 Elsevier Patient Education  2020 [employer].

## 2020-02-13 LAB — C. DIFFICILE GDH AND TOXIN A/B
GDH ANTIGEN: NOT DETECTED
MICRO NUMBER:: 10492211
SPECIMEN QUALITY:: ADEQUATE
TOXIN A AND B: NOT DETECTED

## 2020-02-13 LAB — GASTROINTESTINAL PATHOGEN PANEL PCR
C. difficile Tox A/B, PCR: NOT DETECTED
Campylobacter, PCR: NOT DETECTED
Cryptosporidium, PCR: NOT DETECTED
E coli (ETEC) LT/ST PCR: NOT DETECTED
E coli (STEC) stx1/stx2, PCR: NOT DETECTED
E coli 0157, PCR: NOT DETECTED
Giardia lamblia, PCR: NOT DETECTED
Norovirus, PCR: NOT DETECTED
Rotavirus A, PCR: NOT DETECTED
Salmonella, PCR: NOT DETECTED
Shigella, PCR: NOT DETECTED

## 2020-02-13 LAB — IGA: Immunoglobulin A: 240 mg/dL (ref 70–320)

## 2020-02-13 LAB — TSH: TSH: 1.39 mIU/L (ref 0.40–4.50)

## 2020-02-13 LAB — TISSUE TRANSGLUTAMINASE, IGA: (tTG) Ab, IgA: 1 U/mL

## 2020-02-18 ENCOUNTER — Other Ambulatory Visit: Payer: Self-pay

## 2020-02-18 ENCOUNTER — Encounter: Payer: Self-pay | Admitting: Podiatrist

## 2020-02-18 ENCOUNTER — Ambulatory Visit (INDEPENDENT_AMBULATORY_CARE_PROVIDER_SITE_OTHER): Payer: Medicare Other | Admitting: Podiatrist

## 2020-02-18 VITALS — Temp 97.5°F

## 2020-02-18 DIAGNOSIS — L89620 Pressure ulcer of left heel, unstageable: Secondary | ICD-10-CM | POA: Diagnosis not present

## 2020-02-18 NOTE — Progress Notes (Signed)
Patient presents today with her daughter for follow-up of left heel pressure ulcer.  She states it is not hurting her anymore and she has been keeping weight off of her heel as much as she can.  She spends most of the day in bed and has been careful to float this left heel.  Her daughter is also been applying a barrier zinc cream to the left heel.  Physical Exam   GENERAL APPEARANCE: Alert, conversant. Appropriately groomed. No acute distress.    VASCULAR: Pedal pulses palpable DP and PT bilateral at 1/4.  Capillary refill time is immediate to all digits,  Proximal to distal cooling it warm to warm.     NEUROLOGIC: sensation is decreased epicritically and protectively to 5.07 monofilament at 2/5 sites bilateral.  Light touch is decreased bilateral, vibratory sensation absent bilateral   MUSCULOSKELETAL: acceptable muscle strength, tone and stability bilateral.  No gross boney pedal deformities noted.  No pain, crepitus or limitation noted with foot and ankle range of motion bilateral.    DERMATOLOGIC: skin is warm, supple, and dry.    Pressure sore on the posterior aspect of the left heel is significantly improved at today's visit the area is supple and soft with a very small area of induration in the central aspect measuring 4 mm in diameter.  No bogginess, no fluid, no streaking, no lymphangitis, no sign of infection noted.  Assessment: Resolving pressure ulcer left heel  Plan: Recommended continuing to float both heels while resting and to move pressure from the bony prominences every 15 to 20 minutes while resting.  Discussed continued use of a barrier cream to massage into the heel.  Discussed that the area were to become red, swollen, hot, or any drainage is noted they are to call immediately otherwise she will be seen as needed for follow-up as this should resolve uneventfully.

## 2020-02-18 NOTE — Patient Instructions (Signed)
Continue applying a cream to your heels .  Your sore area looks great!  Keep wearing the protective heel protector and turning in the bed every 15-20 minutes.

## 2020-02-19 ENCOUNTER — Telehealth: Payer: Self-pay | Admitting: Internal Medicine

## 2020-02-19 NOTE — Telephone Encounter (Signed)
See result note.  

## 2020-02-19 NOTE — Telephone Encounter (Signed)
[phone] patient caretaker needs to speak to a nurse.  Patient is having diarrhea after every meal

## 2020-02-20 ENCOUNTER — Other Ambulatory Visit: Payer: Self-pay

## 2020-02-20 ENCOUNTER — Telehealth: Payer: Self-pay | Admitting: Gastroenterology

## 2020-02-20 DIAGNOSIS — R197 Diarrhea, unspecified: Secondary | ICD-10-CM

## 2020-02-20 DIAGNOSIS — Z79899 Other long term (current) drug therapy: Secondary | ICD-10-CM

## 2020-02-20 NOTE — Telephone Encounter (Addendum)
error 

## 2020-02-21 ENCOUNTER — Other Ambulatory Visit: Payer: Self-pay

## 2020-02-21 ENCOUNTER — Ambulatory Visit (HOSPITAL_COMMUNITY)
Admission: RE | Admit: 2020-02-21 | Discharge: 2020-02-21 | Disposition: A | Discharge status: Home / Self Care | Payer: Medicare Other | Source: Ambulatory Visit | Attending: Gastroenterology | Admitting: Gastroenterology

## 2020-02-21 DIAGNOSIS — R197 Diarrhea, unspecified: Secondary | ICD-10-CM | POA: Insufficient documentation

## 2020-02-21 DIAGNOSIS — R634 Abnormal weight loss: Secondary | ICD-10-CM | POA: Diagnosis not present

## 2020-02-21 LAB — POCT I-STAT CREATININE: Creatinine, Ser: 1.1 mg/dL — ABNORMAL HIGH (ref 0.44–1.00)

## 2020-02-21 MED ORDER — IOHEXOL 9 MG/ML PO SOLN
ORAL | Status: AC
  Filled 2020-02-21: qty 1000

## 2020-02-22 ENCOUNTER — Other Ambulatory Visit: Payer: Self-pay | Admitting: Gastroenterology

## 2020-02-22 DIAGNOSIS — E1165 Type 2 diabetes mellitus with hyperglycemia: Secondary | ICD-10-CM | POA: Diagnosis not present

## 2020-02-22 DIAGNOSIS — R8290 Unspecified abnormal findings in urine: Secondary | ICD-10-CM

## 2020-02-22 DIAGNOSIS — R9389 Abnormal findings on diagnostic imaging of other specified body structures: Secondary | ICD-10-CM

## 2020-02-22 DIAGNOSIS — I10 Essential (primary) hypertension: Secondary | ICD-10-CM | POA: Diagnosis not present

## 2020-02-22 DIAGNOSIS — R197 Diarrhea, unspecified: Secondary | ICD-10-CM

## 2020-02-22 MED ORDER — CREON 24000-76000 UNITS PO CPEP: ORAL_CAPSULE | ORAL | 3 refills | Status: DC

## 2020-02-22 NOTE — Progress Notes (Signed)
No acute findings on CT to explain her diarrhea. However, there are several other findings that are going to need further evaluation.  Pancreas with focal prominence and associated calcification over the body of the pancreas, few nonspecific adjacent lymph nodes. Liver with few indistinct hypodensities. States findings are indeterminate and metastatic disease is possible. She needs MRI abdomen with and without contrast for further evaluation.  Also with focus of air over the bladder that could represent infection or enteric fistula. Spoke with Larwance Sachs who states patient had UTI prior to onset of diarrhea and that her urine does have a strong smell. Patient denies dysuria, flatus or feces through the urethra, although history is not reliable in the setting of dementia. I am adding on a urine analysis and urine culture to her other labs that she is needing completed. We will also place referral to urology. Vernelle requesting Dr. Theador Hawthorne.   2 nodules over the right mid and lower lung with recommendations to complete chest CT for further evaluation. I will defer this to her PCP. We will fax over results/recommendations.  Regarding diarrhea, considering pancreatic findings, I am going to go ahead and try her on pancreatic enzymes. She will also continue Imodium as Vernelle does note this is helping decrease frequency of diarrhea.  [facility] Clinical Pool:  1. Please arrange MRI with and without contrast. Dx: abnormal CT, pancreatic prominence with associated calcifications, nonspecific lymph nodes along peripancreatic region and gastrohepatic ligament, several hypodense liver lesions with question of metastatic disease. 2. Please also refer to urology ASAP due to abnormal CT, tiny focus of air over the anterior bladder, infection versus enteric fistula. Vernelle requesting Dr. Theador Hawthorne.   Manuela Schwartz: Please fax CT report to Dr. Legrand Rams with note for patient needing chest CT.

## 2020-02-26 ENCOUNTER — Other Ambulatory Visit: Payer: Self-pay | Admitting: *Deleted

## 2020-02-26 ENCOUNTER — Other Ambulatory Visit: Payer: Self-pay | Admitting: Gastroenterology

## 2020-02-26 DIAGNOSIS — R9389 Abnormal findings on diagnostic imaging of other specified body structures: Secondary | ICD-10-CM | POA: Diagnosis not present

## 2020-02-26 DIAGNOSIS — R8290 Unspecified abnormal findings in urine: Secondary | ICD-10-CM | POA: Diagnosis not present

## 2020-02-26 DIAGNOSIS — R197 Diarrhea, unspecified: Secondary | ICD-10-CM | POA: Diagnosis not present

## 2020-02-26 DIAGNOSIS — Z79899 Other long term (current) drug therapy: Secondary | ICD-10-CM | POA: Diagnosis not present

## 2020-02-27 ENCOUNTER — Telehealth: Payer: Self-pay | Admitting: Emergency Medicine

## 2020-02-27 NOTE — Telephone Encounter (Signed)
Pt is in an assistant living home and she is skipping meals and snacks. The facility needs an order stating if pt doesn't eat then not to administer creon. They also have an order to give creon prn and they need a letter stating why it is to be given prn and to include her dx. The facility stated they need the letter to be faxed to rx care, just in case the state comes in it will cover them when they are not able to administer the medication to the patient.

## 2020-02-27 NOTE — Telephone Encounter (Signed)
Spoke with Kathryn Cook who states they needed the information on the Rx. She is going to talk with nursing staff to get an example of exactly they are needed to meet state requirements as the Rx states to give with meals TID and and snacks up to twice daily. She will reach back out to [place] with the information and I will adjust as needed.

## 2020-02-28 ENCOUNTER — Inpatient Hospital Stay (HOSPITAL_COMMUNITY): Payer: Medicare Other

## 2020-02-28 ENCOUNTER — Other Ambulatory Visit: Payer: Self-pay

## 2020-02-28 ENCOUNTER — Emergency Department (HOSPITAL_COMMUNITY): Payer: Medicare Other

## 2020-02-28 ENCOUNTER — Telehealth: Payer: Self-pay | Admitting: Internal Medicine

## 2020-02-28 ENCOUNTER — Inpatient Hospital Stay (HOSPITAL_COMMUNITY)
Admission: EM | Admit: 2020-02-28 | Discharge: 2020-03-07 | DRG: 682 | Disposition: A | Discharge status: Hospice | Payer: Medicare Other | Attending: Family Medicine | Admitting: Family Medicine

## 2020-02-28 ENCOUNTER — Encounter (HOSPITAL_COMMUNITY): Payer: Self-pay

## 2020-02-28 DIAGNOSIS — K529 Noninfective gastroenteritis and colitis, unspecified: Secondary | ICD-10-CM | POA: Diagnosis present

## 2020-02-28 DIAGNOSIS — Z87891 Personal history of nicotine dependence: Secondary | ICD-10-CM | POA: Diagnosis not present

## 2020-02-28 DIAGNOSIS — Z8673 Personal history of transient ischemic attack (TIA), and cerebral infarction without residual deficits: Secondary | ICD-10-CM | POA: Diagnosis not present

## 2020-02-28 DIAGNOSIS — R0602 Shortness of breath: Secondary | ICD-10-CM | POA: Diagnosis not present

## 2020-02-28 DIAGNOSIS — F0390 Unspecified dementia without behavioral disturbance: Secondary | ICD-10-CM | POA: Diagnosis present

## 2020-02-28 DIAGNOSIS — I130 Hypertensive heart and chronic kidney disease with heart failure and stage 1 through stage 4 chronic kidney disease, or unspecified chronic kidney disease: Secondary | ICD-10-CM | POA: Diagnosis present

## 2020-02-28 DIAGNOSIS — R197 Diarrhea, unspecified: Secondary | ICD-10-CM | POA: Diagnosis present

## 2020-02-28 DIAGNOSIS — Z79899 Other long term (current) drug therapy: Secondary | ICD-10-CM | POA: Diagnosis not present

## 2020-02-28 DIAGNOSIS — E785 Hyperlipidemia, unspecified: Secondary | ICD-10-CM | POA: Diagnosis present

## 2020-02-28 DIAGNOSIS — Z66 Do not resuscitate: Secondary | ICD-10-CM

## 2020-02-28 DIAGNOSIS — C252 Malignant neoplasm of tail of pancreas: Secondary | ICD-10-CM | POA: Diagnosis present

## 2020-02-28 DIAGNOSIS — Z7982 Long term (current) use of aspirin: Secondary | ICD-10-CM | POA: Diagnosis not present

## 2020-02-28 DIAGNOSIS — E1149 Type 2 diabetes mellitus with other diabetic neurological complication: Secondary | ICD-10-CM | POA: Diagnosis present

## 2020-02-28 DIAGNOSIS — K8689 Other specified diseases of pancreas: Secondary | ICD-10-CM | POA: Diagnosis not present

## 2020-02-28 DIAGNOSIS — I5032 Chronic diastolic (congestive) heart failure: Secondary | ICD-10-CM | POA: Diagnosis present

## 2020-02-28 DIAGNOSIS — I959 Hypotension, unspecified: Secondary | ICD-10-CM | POA: Diagnosis present

## 2020-02-28 DIAGNOSIS — Z09 Encounter for follow-up examination after completed treatment for conditions other than malignant neoplasm: Secondary | ICD-10-CM

## 2020-02-28 DIAGNOSIS — R404 Transient alteration of awareness: Secondary | ICD-10-CM | POA: Diagnosis not present

## 2020-02-28 DIAGNOSIS — N183 Chronic kidney disease, stage 3 unspecified: Secondary | ICD-10-CM | POA: Diagnosis present

## 2020-02-28 DIAGNOSIS — Z515 Encounter for palliative care: Secondary | ICD-10-CM

## 2020-02-28 DIAGNOSIS — T383X5A Adverse effect of insulin and oral hypoglycemic [antidiabetic] drugs, initial encounter: Secondary | ICD-10-CM | POA: Diagnosis present

## 2020-02-28 DIAGNOSIS — J9690 Respiratory failure, unspecified, unspecified whether with hypoxia or hypercapnia: Secondary | ICD-10-CM | POA: Diagnosis not present

## 2020-02-28 DIAGNOSIS — F0150 Vascular dementia without behavioral disturbance: Secondary | ICD-10-CM | POA: Diagnosis present

## 2020-02-28 DIAGNOSIS — N179 Acute kidney failure, unspecified: Secondary | ICD-10-CM | POA: Diagnosis not present

## 2020-02-28 DIAGNOSIS — E1122 Type 2 diabetes mellitus with diabetic chronic kidney disease: Secondary | ICD-10-CM | POA: Diagnosis present

## 2020-02-28 DIAGNOSIS — C787 Secondary malignant neoplasm of liver and intrahepatic bile duct: Secondary | ICD-10-CM | POA: Diagnosis present

## 2020-02-28 DIAGNOSIS — Z7189 Other specified counseling: Secondary | ICD-10-CM | POA: Diagnosis not present

## 2020-02-28 DIAGNOSIS — Z794 Long term (current) use of insulin: Secondary | ICD-10-CM | POA: Diagnosis not present

## 2020-02-28 DIAGNOSIS — Z20822 Contact with and (suspected) exposure to covid-19: Secondary | ICD-10-CM | POA: Diagnosis present

## 2020-02-28 DIAGNOSIS — R918 Other nonspecific abnormal finding of lung field: Secondary | ICD-10-CM | POA: Diagnosis not present

## 2020-02-28 DIAGNOSIS — R410 Disorientation, unspecified: Secondary | ICD-10-CM | POA: Diagnosis not present

## 2020-02-28 DIAGNOSIS — J9601 Acute respiratory failure with hypoxia: Secondary | ICD-10-CM | POA: Diagnosis present

## 2020-02-28 DIAGNOSIS — K769 Liver disease, unspecified: Secondary | ICD-10-CM | POA: Diagnosis not present

## 2020-02-28 DIAGNOSIS — I5030 Unspecified diastolic (congestive) heart failure: Secondary | ICD-10-CM | POA: Diagnosis not present

## 2020-02-28 DIAGNOSIS — N390 Urinary tract infection, site not specified: Secondary | ICD-10-CM | POA: Diagnosis present

## 2020-02-28 DIAGNOSIS — R935 Abnormal findings on diagnostic imaging of other abdominal regions, including retroperitoneum: Secondary | ICD-10-CM | POA: Diagnosis not present

## 2020-02-28 DIAGNOSIS — I10 Essential (primary) hypertension: Secondary | ICD-10-CM | POA: Diagnosis present

## 2020-02-28 DIAGNOSIS — R0902 Hypoxemia: Secondary | ICD-10-CM | POA: Diagnosis not present

## 2020-02-28 DIAGNOSIS — B952 Enterococcus as the cause of diseases classified elsewhere: Secondary | ICD-10-CM | POA: Diagnosis present

## 2020-02-28 DIAGNOSIS — E860 Dehydration: Secondary | ICD-10-CM | POA: Diagnosis present

## 2020-02-28 DIAGNOSIS — R9389 Abnormal findings on diagnostic imaging of other specified body structures: Secondary | ICD-10-CM | POA: Diagnosis not present

## 2020-02-28 DIAGNOSIS — E11649 Type 2 diabetes mellitus with hypoglycemia without coma: Secondary | ICD-10-CM | POA: Diagnosis present

## 2020-02-28 LAB — URINE CULTURE
MICRO NUMBER:: 10538260
SPECIMEN QUALITY:: ADEQUATE

## 2020-02-28 LAB — APTT: aPTT: 34 seconds (ref 24–36)

## 2020-02-28 LAB — CBC WITH DIFFERENTIAL/PLATELET
Abs Immature Granulocytes: 0.07 10*3/uL (ref 0.00–0.07)
Absolute Monocytes: 604 cells/uL (ref 200–950)
Basophils Absolute: 80 cells/uL (ref 0–200)
Basophils Absolute: 0.1 10*3/uL (ref 0.0–0.1)
Basophils Relative: 0.7 %
Basophils Relative: 1 %
Eosinophils Absolute: 125 cells/uL (ref 15–500)
Eosinophils Absolute: 0.1 10*3/uL (ref 0.0–0.5)
Eosinophils Relative: 1.1 %
Eosinophils Relative: 1 %
HCT: 41.3 % (ref 35.0–45.0)
HCT: 40.6 % (ref 36.0–46.0)
Hemoglobin: 12.9 g/dL (ref 11.7–15.5)
Hemoglobin: 12.5 g/dL (ref 12.0–15.0)
Immature Granulocytes: 1 %
Lymphocytes Relative: 20 %
Lymphs Abs: 1562 cells/uL (ref 850–3900)
Lymphs Abs: 2 10*3/uL (ref 0.7–4.0)
MCH: 26.8 pg — ABNORMAL LOW (ref 27.0–33.0)
MCH: 26.3 pg (ref 26.0–34.0)
MCHC: 31.2 g/dL — ABNORMAL LOW (ref 32.0–36.0)
MCHC: 30.8 g/dL (ref 30.0–36.0)
MCV: 85.9 fL (ref 80.0–100.0)
MCV: 85.5 fL (ref 80.0–100.0)
MPV: 10.9 fL (ref 7.5–12.5)
Monocytes Absolute: 0.9 10*3/uL (ref 0.1–1.0)
Monocytes Relative: 5.3 %
Monocytes Relative: 10 %
Neutro Abs: 9029 cells/uL — ABNORMAL HIGH (ref 1500–7800)
Neutro Abs: 6.4 10*3/uL (ref 1.7–7.7)
Neutrophils Relative %: 79.2 %
Neutrophils Relative %: 67 %
Platelets: 235 10*3/uL (ref 140–400)
Platelets: 243 10*3/uL (ref 150–400)
RBC: 4.81 10*6/uL (ref 3.80–5.10)
RBC: 4.75 MIL/uL (ref 3.87–5.11)
RDW: 14.9 % (ref 11.0–15.0)
RDW: 16.5 % — ABNORMAL HIGH (ref 11.5–15.5)
Total Lymphocyte: 13.7 %
WBC: 11.4 10*3/uL — ABNORMAL HIGH (ref 3.8–10.8)
WBC: 9.5 10*3/uL (ref 4.0–10.5)
nRBC: 0 % (ref 0.0–0.2)

## 2020-02-28 LAB — URINALYSIS
Bilirubin Urine: NEGATIVE
Glucose, UA: NEGATIVE
Hgb urine dipstick: NEGATIVE
Nitrite: NEGATIVE
Specific Gravity, Urine: 1.019 (ref 1.001–1.03)
pH: <=5 (ref 5.0–8.0)

## 2020-02-28 LAB — COMPREHENSIVE METABOLIC PANEL
AG Ratio: 1.2 (calc) (ref 1.0–2.5)
ALT: 10 U/L (ref 6–29)
ALT: 12 U/L (ref 0–44)
AST: 13 U/L (ref 10–35)
AST: 15 U/L (ref 15–41)
Albumin: 4.2 g/dL (ref 3.6–5.1)
Albumin: 3.7 g/dL (ref 3.5–5.0)
Alkaline Phosphatase: 84 U/L (ref 38–126)
Alkaline phosphatase (APISO): 97 U/L (ref 37–153)
Anion gap: 12 (ref 5–15)
BUN/Creatinine Ratio: 19 (calc) (ref 6–22)
BUN: 56 mg/dL — ABNORMAL HIGH (ref 7–25)
BUN: 60 mg/dL — ABNORMAL HIGH (ref 8–23)
CO2: 21 mmol/L (ref 20–32)
CO2: 17 mmol/L — ABNORMAL LOW (ref 22–32)
Calcium: 9.9 mg/dL (ref 8.6–10.4)
Calcium: 9.3 mg/dL (ref 8.9–10.3)
Chloride: 107 mmol/L (ref 98–110)
Chloride: 110 mmol/L (ref 98–111)
Creat: 2.88 mg/dL — ABNORMAL HIGH (ref 0.50–0.99)
Creatinine, Ser: 3.6 mg/dL — ABNORMAL HIGH (ref 0.44–1.00)
GFR calc Af Amer: 14 mL/min — ABNORMAL LOW (ref >60)
GFR calc non Af Amer: 12 mL/min — ABNORMAL LOW (ref >60)
Globulin: 3.5 g/dL (calc) (ref 1.9–3.7)
Glucose, Bld: 237 mg/dL — ABNORMAL HIGH (ref 65–139)
Glucose, Bld: 47 mg/dL — ABNORMAL LOW (ref 70–99)
Potassium: 3.9 mmol/L (ref 3.5–5.3)
Potassium: 3.7 mmol/L (ref 3.5–5.1)
Sodium: 142 mmol/L (ref 135–146)
Sodium: 139 mmol/L (ref 135–145)
Total Bilirubin: 0.5 mg/dL (ref 0.2–1.2)
Total Bilirubin: 0.5 mg/dL (ref 0.3–1.2)
Total Protein: 7.7 g/dL (ref 6.1–8.1)
Total Protein: 7.4 g/dL (ref 6.5–8.1)

## 2020-02-28 LAB — URINALYSIS, ROUTINE W REFLEX MICROSCOPIC
Bilirubin Urine: NEGATIVE
Glucose, UA: NEGATIVE mg/dL
Hgb urine dipstick: NEGATIVE
Ketones, ur: NEGATIVE mg/dL
Nitrite: NEGATIVE
Protein, ur: NEGATIVE mg/dL
Specific Gravity, Urine: 1.005 (ref 1.005–1.030)
pH: 5 (ref 5.0–8.0)

## 2020-02-28 LAB — CBG MONITORING, ED
Glucose-Capillary: 44 mg/dL — CL (ref 70–99)
Glucose-Capillary: 88 mg/dL (ref 70–99)

## 2020-02-28 LAB — SARS CORONAVIRUS 2 BY RT PCR (HOSPITAL ORDER, PERFORMED IN ~~LOC~~ HOSPITAL LAB): SARS Coronavirus 2: NEGATIVE

## 2020-02-28 LAB — PROTIME-INR
INR: 1.3 — ABNORMAL HIGH (ref 0.8–1.2)
Prothrombin Time: 15.6 seconds — ABNORMAL HIGH (ref 11.4–15.2)

## 2020-02-28 LAB — LACTIC ACID, PLASMA
Lactic Acid, Venous: 1.8 mmol/L (ref 0.5–1.9)
Lactic Acid, Venous: 1.3 mmol/L (ref 0.5–1.9)

## 2020-02-28 LAB — GLUCOSE, CAPILLARY: Glucose-Capillary: 73 mg/dL (ref 70–99)

## 2020-02-28 LAB — BRAIN NATRIURETIC PEPTIDE: B Natriuretic Peptide: 356 pg/mL — ABNORMAL HIGH (ref 0.0–100.0)

## 2020-02-28 MED ORDER — DONEPEZIL HCL 5 MG PO TABS
10.0000 mg | ORAL_TABLET | Freq: Every day | ORAL | Status: DC
  Administered 2020-02-28 – 2020-03-06 (×8): 10 mg via ORAL
  Filled 2020-02-28 (×9): qty 2

## 2020-02-28 MED ORDER — DEXTROSE 50 % IV SOLN: 25.0000 mL | Freq: Once | INTRAVENOUS | Status: DC

## 2020-02-28 MED ORDER — ASPIRIN EC 325 MG PO TBEC
325.0000 mg | DELAYED_RELEASE_TABLET | Freq: Every day | ORAL | Status: DC
  Administered 2020-02-29 – 2020-03-07 (×8): 325 mg via ORAL
  Filled 2020-02-28 (×8): qty 1

## 2020-02-28 MED ORDER — UMECLIDINIUM BROMIDE 62.5 MCG/INH IN AEPB
1.0000 | INHALATION_SPRAY | Freq: Every day | RESPIRATORY_TRACT | Status: DC
  Administered 2020-02-29 – 2020-03-07 (×8): 1 via RESPIRATORY_TRACT
  Filled 2020-02-28 (×2): qty 7

## 2020-02-28 MED ORDER — ONDANSETRON HCL 4 MG/2ML IJ SOLN: 4.0000 mg | Freq: Four times a day (QID) | INTRAMUSCULAR | Status: DC | PRN

## 2020-02-28 MED ORDER — IPRATROPIUM-ALBUTEROL 0.5-2.5 (3) MG/3ML IN SOLN: 3.0000 mL | Freq: Four times a day (QID) | RESPIRATORY_TRACT | Status: DC | PRN

## 2020-02-28 MED ORDER — IPRATROPIUM-ALBUTEROL 20-100 MCG/ACT IN AERS: 1.0000 | INHALATION_SPRAY | Freq: Four times a day (QID) | RESPIRATORY_TRACT | Status: DC | PRN

## 2020-02-28 MED ORDER — ACETAMINOPHEN 650 MG RE SUPP: 650.0000 mg | Freq: Four times a day (QID) | RECTAL | Status: DC | PRN

## 2020-02-28 MED ORDER — INSULIN ASPART 100 UNIT/ML ~~LOC~~ SOLN
0.0000 [IU] | Freq: Three times a day (TID) | SUBCUTANEOUS | Status: DC
  Administered 2020-02-29: 3 [IU] via SUBCUTANEOUS
  Administered 2020-02-29: 2 [IU] via SUBCUTANEOUS
  Administered 2020-02-29 – 2020-03-01 (×2): 7 [IU] via SUBCUTANEOUS
  Administered 2020-03-01: 3 [IU] via SUBCUTANEOUS
  Administered 2020-03-01 – 2020-03-02 (×2): 2 [IU] via SUBCUTANEOUS
  Administered 2020-03-02: 5 [IU] via SUBCUTANEOUS
  Administered 2020-03-02 – 2020-03-03 (×2): 2 [IU] via SUBCUTANEOUS
  Administered 2020-03-03: 1 [IU] via SUBCUTANEOUS
  Administered 2020-03-03 – 2020-03-04 (×2): 2 [IU] via SUBCUTANEOUS
  Administered 2020-03-04: 1 [IU] via SUBCUTANEOUS
  Administered 2020-03-06 – 2020-03-07 (×3): 2 [IU] via SUBCUTANEOUS

## 2020-02-28 MED ORDER — SODIUM CHLORIDE 0.9 % IV SOLN: INTRAVENOUS | Status: DC

## 2020-02-28 MED ORDER — POLYETHYLENE GLYCOL 3350 17 G PO PACK: 17.0000 g | PACK | Freq: Every day | ORAL | Status: DC | PRN

## 2020-02-28 MED ORDER — SODIUM CHLORIDE 0.9 % IV SOLN
1.0000 g | Freq: Once | INTRAVENOUS | Status: AC
  Administered 2020-02-28: 1 g via INTRAVENOUS
  Filled 2020-02-28: qty 10

## 2020-02-28 MED ORDER — HEPARIN SODIUM (PORCINE) 5000 UNIT/ML IJ SOLN
5000.0000 [IU] | Freq: Three times a day (TID) | INTRAMUSCULAR | Status: DC
  Administered 2020-02-28 – 2020-03-07 (×21): 5000 [IU] via SUBCUTANEOUS
  Filled 2020-02-28 (×23): qty 1

## 2020-02-28 MED ORDER — LOPERAMIDE HCL 2 MG PO CAPS: 2.0000 mg | ORAL_CAPSULE | Freq: Four times a day (QID) | ORAL | Status: DC | PRN

## 2020-02-28 MED ORDER — INSULIN ASPART 100 UNIT/ML ~~LOC~~ SOLN: 0.0000 [IU] | Freq: Every day | SUBCUTANEOUS | Status: DC

## 2020-02-28 MED ORDER — POTASSIUM CHLORIDE CRYS ER 20 MEQ PO TBCR
20.0000 meq | EXTENDED_RELEASE_TABLET | Freq: Once | ORAL | Status: AC
  Administered 2020-02-28: 20 meq via ORAL
  Filled 2020-02-28: qty 1

## 2020-02-28 MED ORDER — ONDANSETRON HCL 4 MG PO TABS: 4.0000 mg | ORAL_TABLET | Freq: Four times a day (QID) | ORAL | Status: DC | PRN

## 2020-02-28 MED ORDER — SODIUM CHLORIDE 0.9 % IV SOLN
2.0000 g | INTRAVENOUS | Status: DC
  Administered 2020-02-29 – 2020-03-03 (×4): 2 g via INTRAVENOUS
  Filled 2020-02-28 (×4): qty 20

## 2020-02-28 MED ORDER — ACETAMINOPHEN 325 MG PO TABS: 650.0000 mg | ORAL_TABLET | Freq: Four times a day (QID) | ORAL | Status: DC | PRN

## 2020-02-28 MED ORDER — SODIUM CHLORIDE 0.9 % IV BOLUS (SEPSIS)
3000.0000 mL | Freq: Once | INTRAVENOUS | Status: AC
  Administered 2020-02-28: 3000 mL via INTRAVENOUS

## 2020-02-28 MED ORDER — DEXTROSE-NACL 5-0.9 % IV SOLN: INTRAVENOUS | Status: AC

## 2020-02-28 MED ORDER — PANCRELIPASE (LIP-PROT-AMYL) 12000-38000 UNITS PO CPEP
24000.0000 [IU] | ORAL_CAPSULE | Freq: Three times a day (TID) | ORAL | Status: DC
  Administered 2020-02-29 – 2020-03-03 (×10): 24000 [IU] via ORAL
  Filled 2020-02-28 (×10): qty 2

## 2020-02-28 MED ORDER — CITALOPRAM HYDROBROMIDE 20 MG PO TABS
40.0000 mg | ORAL_TABLET | Freq: Every day | ORAL | Status: DC
  Administered 2020-02-28 – 2020-03-07 (×9): 40 mg via ORAL
  Filled 2020-02-28 (×9): qty 2

## 2020-02-28 NOTE — ED Notes (Signed)
Hypoglycemic Event  CBG: 44  Treatment: 8 oz juice/soda  Symptoms: None  Follow-up CBG: Time:1833 CBG Result:88  Possible Reasons for Event: Inadequate meal intake  Comments/MD notified: Dr. Denton Brick aware

## 2020-02-28 NOTE — ED Triage Notes (Addendum)
Pt reside of [facility] assisted living.  Kristen with [facility] called and reports they did some outpt lab work recently and her kidney function increased to 2.88 recently and has had decreased oral intake, diarrhea, and confusion.  Reports checked ua and urine culture yesterday and was positive.  Gastro instructed pt to come to ed yesterday for evaluation but says pt refused.  Reports pt has dementia.  EMS reports pt's bp was 90/60, o2 sat 89-90% on room air and increased 92% on 2liters.  cbg 140 and reports increased swelling to bilateral lower extremities.  Facility says pt has had loos stools for the past 6 weeks.  Reports has only had approx 10oz of fluid today.  Gertie Fey reports they have not started pt on antibiotics for her UTI yet.

## 2020-02-28 NOTE — ED Provider Notes (Signed)
[name] EMERGENCY DEPARTMENT Provider Note   CSN: [id number] Arrival date & time: 02/28/20  1346     History Chief Complaint  Patient presents with  . Urinary Tract Infection    Kathryn Cook is a 69 y.o. female.  Patient sent over the emergency department for diarrhea weakness and urinary tract infection.  Labs from her doctor shows her to have an AKI and urinary tract infection.  Patient has no complaints but she has a history of dementia  The history is provided by the patient, medical records and the nursing home. No language interpreter was used.  Weakness Severity:  Moderate Onset quality:  Sudden Timing:  Constant Progression:  Worsening Chronicity:  New Context: not alcohol use   Relieved by:  Nothing Worsened by:  Nothing Ineffective treatments:  None tried Associated symptoms: diarrhea   Associated symptoms: no abdominal pain        Past Medical History:  Diagnosis Date  . Bronchitis   . CVA (cerebral infarction)   . Diabetes mellitus without complication ([place])   . Hyperlipidemia   . Hypertension   . Memory loss   . Stroke ([place])   . Substance abuse ([place])    alcohol, but patient states she doesn't drink that much anymore    Patient Active Problem List   Diagnosis Date Noted  . AKI (acute kidney injury) ([place]) 02/28/2020  . Diarrhea 02/08/2020  . Mixed hyperlipidemia 05/17/2019  . Essential hypertension, benign 05/17/2019  . Healthcare maintenance 03/02/2016  . Type II diabetes mellitus with neurological manifestations ([place]) 06/12/2015  . Moderate dementia without behavioral disturbance ([place]) 12/06/2013  . Dementia ([place]) 10/18/2013  . Tobacco abuse 10/18/2013  . HTN (hypertension), malignant 10/18/2013  . Uncontrolled type 2 diabetes mellitus with stage 4 chronic kidney disease ([place]) 10/01/2013  . Cough 08/27/2013  . Tobacco use disorder 08/27/2013  . H/O: CVA (cerebrovascular accident) 08/27/2013  . Vascular dementia ([place]) 08/27/2013    . Diastolic congestive heart failure, NYHA class 1 ([place]) 06/13/2013  . LVH (left ventricular hypertrophy) due to hypertensive disease 06/13/2013  . CVA (cerebral infarction) 06/10/2013  . History of alcohol use 05/31/2013  . HTN (hypertension) 05/31/2013  . Memory loss 05/31/2013  . Kidney disease, chronic, stage III (GFR 30-59 ml/min) 05/31/2013    Past Surgical History:  Procedure Laterality Date  . COLONOSCOPY N/A 08/22/2018   Procedure: COLONOSCOPY;  Surgeon: Daneil Dolin, MD;  two tubular adenomas with recommendations to repeat in 2024.   Marland Kitchen POLYPECTOMY  08/22/2018   Procedure: POLYPECTOMY;  Surgeon: Daneil Dolin, MD;  Location: [facility];  Service: Endoscopy;;  colon     OB History    Gravida  1   Para  1   Term  1   Preterm      AB      Living  1     SAB      TAB      Ectopic      Multiple      Live Births              Family History  Problem Relation Age of Onset  . Other Mother        unknown  . Other Father        unknown  . Colon cancer Neg Hx     Social History   Tobacco Use  . Smoking status: Former Smoker    Packs/day: 0.25    Years: 45.00  Pack years: 11.25    Types: Cigarettes    Quit date: 09/27/2013    Years since quitting: 6.4  . Smokeless tobacco: Never Used  Substance Use Topics  . Alcohol use: No    Comment: occasional  . Drug use: No    Types: Marijuana    Comment: last used 3 months ago    Home Medications Prior to Admission medications   Medication Sig Start Date End Date Taking? Authorizing Provider  albuterol (VENTOLIN HFA) 108 (90 Base) MCG/ACT inhaler Inhale into the lungs every 6 (six) hours as needed for wheezing or shortness of breath.    [provider]  amLODipine (NORVASC) 5 MG tablet Take 5 mg by mouth daily.    [provider]  aspirin EC 325 MG tablet Take 325 mg by mouth daily.    [provider]  carvedilol (COREG) 12.5 MG tablet Take 12.5 mg by mouth 2 (two)  times daily with a meal.    [provider]  citalopram (CELEXA) 40 MG tablet Take 40 mg by mouth daily. 02/27/20   [provider]  COMBIVENT RESPIMAT 20-100 MCG/ACT AERS respimat as needed.  06/05/19   [provider]  donepezil (ARICEPT) 10 MG tablet Take 10 mg by mouth at bedtime.    [provider]  furosemide (LASIX) 40 MG tablet Take 40 mg by mouth daily.    [provider]  glucose blood (EASYMAX TEST) test strip Use as instructed 05/17/19   Cassandria Anger, MD  Insulin Glargine [facility] KWIKPEN) 100 UNIT/ML SOPN INJECT 50 UNITS SUBCUTANEOUSLY AT BEDTIME. 07/17/19   Nida, Marella Chimes, MD  ketoconazole (NIZORAL) 2 % cream Apply 1 application topically daily.    [provider]  linagliptin (TRADJENTA) 5 MG TABS tablet Take 5 mg by mouth daily.    [provider]  loperamide (IMODIUM) 2 MG capsule TAKE 1 CAPSULE (2MG  TOTAL) BY MOUTH 4 (FOUR) TIMES DAILY AS NEEDED FOR DIARRHEA OR LOOSE STOOLS. 02/27/20   Erenest Rasher, PA-C  losartan (COZAAR) 100 MG tablet Take 100 mg by mouth daily.    [provider]  Multiple Vitamin (MULTIVITAMIN) tablet Take 1 tablet by mouth daily.    [provider]  NOVOLOG FLEXPEN 100 UNIT/ML FlexPen INJECT SQ SLIDING SCALE 3 TIMES DAILY WITH MEALS 90-150=10U:151-200=11U:20 1-250=12U:251-300=13U:301 -350=14U:351-400=15U:ABOV E400 CALL MD 11/30/19   Cassandria Anger, MD  Pancrelipase, Lip-Prot-Amyl, (CREON) 24000-76000 units CPEP Take 2 capsules with meals 3 times daily and 1 capsule with snacks up to twice daily. 02/22/20   Erenest Rasher, PA-C  PARoxetine (PAXIL) 20 MG tablet Take 20 mg by mouth daily.    [provider]  Propylene Glycol (SYSTANE COMPLETE OP) Apply to eye 3 (three) times daily.    [provider]  simvastatin (ZOCOR) 20 MG tablet Take 20 mg by mouth daily.    [provider]  umeclidinium bromide (INCRUSE ELLIPTA) 62.5 MCG/INH AEPB  Inhale 1 puff into the lungs daily.    [provider]    Allergies    Ace inhibitors and Diovan [valsartan]  Review of Systems   Review of Systems  Unable to perform ROS: Mental status change  Gastrointestinal: Positive for diarrhea. Negative for abdominal pain.  Neurological: Positive for weakness.    Physical Exam Updated Vital Signs BP 100/70   Pulse 64   Temp 97.7 F (36.5 C) (Oral)   Resp 19   Ht 5\' 4"  (1.626 m)   Wt 107 kg  SpO2 94%   BMI 40.51 kg/m   Physical Exam Vitals and nursing note reviewed.  Constitutional:      Appearance: She is well-developed.  HENT:     Head: Normocephalic.     Comments: Dry mucous membranes    Nose: Nose normal.  Eyes:     General: No scleral icterus.    Conjunctiva/sclera: Conjunctivae normal.  Neck:     Thyroid: No thyromegaly.  Cardiovascular:     Rate and Rhythm: Normal rate and regular rhythm.     Heart sounds: No murmur. No friction rub. No gallop.   Pulmonary:     Breath sounds: No stridor. No wheezing or rales.  Chest:     Chest wall: No tenderness.  Abdominal:     General: There is no distension.     Tenderness: There is no abdominal tenderness. There is no rebound.  Musculoskeletal:        General: Normal range of motion.     Cervical back: Neck supple.  Lymphadenopathy:     Cervical: No cervical adenopathy.  Skin:    Findings: No erythema or rash.  Neurological:     Mental Status: She is alert.     Motor: No abnormal muscle tone.     Coordination: Coordination normal.     Comments: Oriented to person place  Psychiatric:        Behavior: Behavior normal.     ED Results / Procedures / Treatments   Labs (all labs ordered are listed, but only abnormal results are displayed) Labs Reviewed  COMPREHENSIVE METABOLIC PANEL - Abnormal; Notable for the following components:      Result Value   CO2 17 (*)    Glucose, Bld 47 (*)    BUN 60 (*)    Creatinine, Ser 3.60 (*)    GFR calc non Af Amer 12  (*)    GFR calc Af Amer 14 (*)    All other components within normal limits  CBC WITH DIFFERENTIAL/PLATELET - Abnormal; Notable for the following components:   RDW 16.5 (*)    All other components within normal limits  PROTIME-INR - Abnormal; Notable for the following components:   Prothrombin Time 15.6 (*)    INR 1.3 (*)    All other components within normal limits  SARS CORONAVIRUS 2 BY RT PCR (HOSPITAL ORDER, [facility])  CULTURE, BLOOD (ROUTINE X 2)  CULTURE, BLOOD (ROUTINE X 2)  URINE CULTURE  LACTIC ACID, PLASMA  APTT  LACTIC ACID, PLASMA  URINALYSIS, ROUTINE W REFLEX MICROSCOPIC    EKG None  Radiology DG Chest Port 1 View  Result Date: 02/28/2020 CLINICAL DATA:  Shortness of breath. EXAM: PORTABLE CHEST 1 VIEW COMPARISON:  Chest x-ray dated July 19, 2018. FINDINGS: The heart size and mediastinal contours are within normal limits. Pulmonary vascular congestion with chronically coarse interstitial markings. New 1.5 cm nodule in the left upper lobe. No focal consolidation, pleural effusion, or pneumothorax. No acute osseous abnormality. IMPRESSION: 1. New 1.5 cm nodule in the left upper lobe. Chest CT is recommended for further evaluation. 2. Mild pulmonary vascular congestion. Electronically Signed   By: Titus Dubin M.D.   On: 02/28/2020 15:49    Procedures Procedures (including critical care time)  Medications Ordered in ED Medications  sodium chloride 0.9 % bolus 3,000 mL (3,000 mLs Intravenous New Bag/Given 02/28/20 1544)  cefTRIAXone (ROCEPHIN) 1 g in sodium chloride 0.9 % 100 mL IVPB ( Intravenous Stopped 02/28/20 1627)  ED Course  I have reviewed the triage vital signs and the nursing notes.  Pertinent labs & imaging results that were available during my care of the patient were reviewed by me and considered in my medical decision making (see chart for details).    MDM Rules/Calculators/A&P                      Patient with AKI.   Also urine tract infection.  Initial hypotension.  Patient will be admitted to medicine     This patient presents to the ED for concern of weakness this involves an extensive number of treatment options, and is a complaint that carries with it a high risk of complications and morbidity.  The differential diagnosis includes dehydration urinary tract infection   Lab Tests:   I Ordered, reviewed, and interpreted labs, which included CBC and chemistries which show AKI  Medicines ordered:   I ordered medication normal saline for dehydration and Rocephin for infection  Imaging Studies ordered:   I ordered imaging studies which included chest x-ray and  I independently visualized and interpreted imaging which showed lung mass  Additional history obtained:   Additional history obtained from nursing home  Previous records obtained and reviewed all records  Consultations Obtained:   I consulted hospitalist and discussed lab and imaging findings  Reevaluation:  After the interventions stated above, I reevaluated the patient and found improved  Critical Interventions:  .   Final Clinical Impression(s) / ED Diagnoses Final diagnoses:  AKI (acute kidney injury) [facility])    Rx / [place] Orders ED Discharge Orders    None       Milton Ferguson, MD 02/28/20 1735

## 2020-02-28 NOTE — Telephone Encounter (Signed)
SOMEONE CALLED FOR THE PATIENT AND STATED THAT SHE IS GOING TO GO TO THE ER, THEY ARE GOING TO CALL EMS

## 2020-02-28 NOTE — ED Notes (Addendum)
Pt's nursing facility called to report that pt had a pressure sore on her left heel with no skin broken prior to leaving their facility today.   Upon RN's assessment pt does have  a small, stage II pressure wound noted to pt's left posterior foot.

## 2020-02-28 NOTE — Telephone Encounter (Signed)
Spoke with [employer]. Patient is on her way to ED as I had requested yesterday due to AKI, decreased oral intake, and mild worsening of confusion. See result note from 6/1. Also with UTI with culture positive for citrobacter koseri. Holding off and prescribing antibiotics as patient is on her way to the ED.

## 2020-02-28 NOTE — H&P (Addendum)
History and Physical    [name] NLZ:[id number] DOB: [birth year] DOA: 02/28/2020  PCP: [name], MD   Patient coming from: [facility].  I have personally briefly reviewed patient's old medical records in [place]  Chief Complaint: Abnormal labs, diarrhea, confusion.  HPI: Kathryn Cook is a 69 y.o. female with medical history significant for diastolic CHF, CKD, diabetes mellitus, dementia, CVA, hypertension. Patient was sent to the ED from nursing home with reports of worsening renal function, creatinine checked 2 days ago was 2.88.  Also with reported poor p.o. intake, loose stools for 6 weeks. She reports having on average 2-3 bowel movements a day, basically anytime she eats something it just runs through her. Patient had urine culture drawn, resulted yesterday, grew Citrobacter koseri.  Patient has not been started on antibiotics yet for her UTI, she was sent to the ED. On EMS arrival patient's blood pressure was 90/60, with O2 sats 89-90% on room air improved with 2 L O2 to 92%.    On my evaluation, patient has dementia, but she is able to answer simple questions unsure if these answers are appropriate. Sister- [name] is present at bedside. Patient has chronic unchanged lower extremity swelling. Patient denies difficulty breathing. She denies pain with urination. No abdominal pain, no vomiting. Patient's mental status at this time. Sister is at baseline.  ED Course: Temperature 97.7, heart rate 60s to 70s, blood pressure systolic down to 90/24 improved to 170 after 3 L sepsis fluid bolus.  O2 sats 90 to 97% on 3 L O2.  Creatinine continues to increase now 3.6, BUN of 60.  Normal lactic acid 1.8.  Covid test negative.  Stable chest x-ray showing new 1.5 cm nodule left upper lobe, chest CT recommended, mild pulmonary vascular congestion.  Obtained.  Patient was started on ceftriaxone for UTI.  Hospitalist to admit for further evaluation and  management.  Review of Systems: As per HPI all other systems reviewed and negative.  Past Medical History:  Diagnosis Date  . Bronchitis   . CVA (cerebral infarction)   . Diabetes mellitus without complication ([place])   . Hyperlipidemia   . Hypertension   . Memory loss   . Stroke ([place])   . Substance abuse ([place])    alcohol, but patient states she doesn't drink that much anymore    Past Surgical History:  Procedure Laterality Date  . COLONOSCOPY N/A 08/22/2018   Procedure: COLONOSCOPY;  Surgeon: Daneil Dolin, MD;  two tubular adenomas with recommendations to repeat in 2024.   Marland Kitchen POLYPECTOMY  08/22/2018   Procedure: POLYPECTOMY;  Surgeon: Daneil Dolin, MD;  Location: [facility];  Service: Endoscopy;;  colon     reports that she quit smoking about 6 years ago. Her smoking use included cigarettes. She has a 11.25 pack-year smoking history. She has never used smokeless tobacco. She reports that she does not drink alcohol or use drugs.  Allergies  Allergen Reactions  . Ace Inhibitors Cough  . Diovan [Valsartan] Cough    Family History  Problem Relation Age of Onset  . Other Mother        unknown  . Other Father        unknown  . Colon cancer Neg Hx     Prior to Admission medications   Medication Sig Start Date End Date Taking? Authorizing Provider  albuterol (VENTOLIN HFA) 108 (90 Base) MCG/ACT inhaler Inhale into the lungs every 6 (six) hours as needed  for wheezing or shortness of breath.    [provider]  amLODipine (NORVASC) 5 MG tablet Take 5 mg by mouth daily.    [provider]  aspirin EC 325 MG tablet Take 325 mg by mouth daily.    [provider]  carvedilol (COREG) 12.5 MG tablet Take 12.5 mg by mouth 2 (two) times daily with a meal.    [provider]  citalopram (CELEXA) 40 MG tablet Take 40 mg by mouth daily. 02/27/20   [provider]  COMBIVENT RESPIMAT 20-100 MCG/ACT AERS respimat as needed.  06/05/19    [provider]  donepezil (ARICEPT) 10 MG tablet Take 10 mg by mouth at bedtime.    [provider]  furosemide (LASIX) 40 MG tablet Take 40 mg by mouth daily.    [provider]  glucose blood (EASYMAX TEST) test strip Use as instructed 05/17/19   Cassandria Anger, MD  Insulin Glargine [facility] KWIKPEN) 100 UNIT/ML SOPN INJECT 50 UNITS SUBCUTANEOUSLY AT BEDTIME. 07/17/19   Nida, Marella Chimes, MD  ketoconazole (NIZORAL) 2 % cream Apply 1 application topically daily.    [provider]  linagliptin (TRADJENTA) 5 MG TABS tablet Take 5 mg by mouth daily.    [provider]  loperamide (IMODIUM) 2 MG capsule TAKE 1 CAPSULE (2MG  TOTAL) BY MOUTH 4 (FOUR) TIMES DAILY AS NEEDED FOR DIARRHEA OR LOOSE STOOLS. 02/27/20   Erenest Rasher, PA-C  losartan (COZAAR) 100 MG tablet Take 100 mg by mouth daily.    [provider]  Multiple Vitamin (MULTIVITAMIN) tablet Take 1 tablet by mouth daily.    [provider]  NOVOLOG FLEXPEN 100 UNIT/ML FlexPen INJECT SQ SLIDING SCALE 3 TIMES DAILY WITH MEALS 90-150=10U:151-200=11U:20 1-250=12U:251-300=13U:301 -350=14U:351-400=15U:ABOV E400 CALL MD 11/30/19   Cassandria Anger, MD  Pancrelipase, Lip-Prot-Amyl, (CREON) 24000-76000 units CPEP Take 2 capsules with meals 3 times daily and 1 capsule with snacks up to twice daily. 02/22/20   Erenest Rasher, PA-C  PARoxetine (PAXIL) 20 MG tablet Take 20 mg by mouth daily.    [provider]  Propylene Glycol (SYSTANE COMPLETE OP) Apply to eye 3 (three) times daily.    [provider]  simvastatin (ZOCOR) 20 MG tablet Take 20 mg by mouth daily.    [provider]  umeclidinium bromide (INCRUSE ELLIPTA) 62.5 MCG/INH AEPB Inhale 1 puff into the lungs daily.    [provider]    Physical Exam: Vitals:   02/28/20 1556 02/28/20 1600 02/28/20 1651 02/28/20 1700  BP:  99/72  100/70  Pulse:  75  64  Resp:  19  19  Temp:        TempSrc:      SpO2:  91%  94%  Weight: 111.1 kg  107 kg   Height:        Constitutional: NAD, calm, comfortable Vitals:   02/28/20 1556 02/28/20 1600 02/28/20 1651 02/28/20 1700  BP:  99/72  100/70  Pulse:  75  64  Resp:  19  19  Temp:      TempSrc:      SpO2:  91%  94%  Weight: 111.1 kg  107 kg   Height:       Eyes: Right eye directed superiorly, this is normal for patient,, lids and conjunctivae normal ENMT: Mucous membranes are dry  Neck: normal, supple, no masses, no thyromegaly Respiratory: On my evaluation O2 sats down to 89% on room air, normal respiratory effort. No  accessory muscle use.  Cardiovascular: Regular rate and rhythm, . Trace bilateral lower extremity edema. Extremities warm and well-perfused. Abdomen: no tenderness, no masses palpated. No hepatosplenomegaly. Bowel sounds positive.  Musculoskeletal: no clubbing / cyanosis. No joint deformity upper and lower extremities. Good ROM, no contractures. Normal muscle tone.  Skin: no rashes, lesions, ulcers. No induration Neurologic: 4+/5 strength in all extremities, no facial asymmetry, except for right. Psychiatric: Awake and alert, oriented to person, place, not oriented to situation or time.  Labs on Admission: I have personally reviewed following labs and imaging studies  CBC: Recent Labs  Lab 02/26/20 1039 02/28/20 1522  WBC 11.4* 9.5  NEUTROABS 9,029* 6.4  HGB 12.9 12.5  HCT 41.3 40.6  MCV 85.9 85.5  PLT 235 101   Basic Metabolic Panel: Recent Labs  Lab 02/26/20 1039 02/28/20 1522  NA 142 139  K 3.9 3.7  CL 107 110  CO2 21 17*  GLUCOSE 237* 47*  BUN 56* 60*  CREATININE 2.88* 3.60*  CALCIUM 9.9 9.3   Liver Function Tests: Recent Labs  Lab 02/26/20 1039 02/28/20 1522  AST 13 15  ALT 10 12  ALKPHOS  --  84  BILITOT 0.5 0.5  PROT 7.7 7.4  ALBUMIN  --  3.7   Coagulation Profile: Recent Labs  Lab 02/28/20 1522  INR 1.3*   Urine analysis:    Component Value Date/Time    COLORURINE YELLOW 02/26/2020 1039   APPEARANCEUR CLOUDY (A) 02/26/2020 1039   APPEARANCEUR Clear 07/20/2016 1115   LABSPEC 1.019 02/26/2020 1039   PHURINE < OR = 5.0 02/26/2020 1039   GLUCOSEU NEGATIVE 02/26/2020 1039   HGBUR NEGATIVE 02/26/2020 1039   BILIRUBINUR NEGATIVE 01/08/2020 0003   BILIRUBINUR negative 12/26/2019 1554   BILIRUBINUR Negative 07/20/2016 1115   KETONESUR TRACE (A) 02/26/2020 1039   PROTEINUR 1+ (A) 02/26/2020 1039   UROBILINOGEN 0.2 12/26/2019 1554   UROBILINOGEN 0.2 06/11/2013 0303   NITRITE NEGATIVE 02/26/2020 1039   LEUKOCYTESUR 2+ (A) 02/26/2020 1039    Radiological Exams on Admission: DG Chest Port 1 View  Result Date: 02/28/2020 CLINICAL DATA:  Shortness of breath. EXAM: PORTABLE CHEST 1 VIEW COMPARISON:  Chest x-ray dated July 19, 2018. FINDINGS: The heart size and mediastinal contours are within normal limits. Pulmonary vascular congestion with chronically coarse interstitial markings. New 1.5 cm nodule in the left upper lobe. No focal consolidation, pleural effusion, or pneumothorax. No acute osseous abnormality. IMPRESSION: 1. New 1.5 cm nodule in the left upper lobe. Chest CT is recommended for further evaluation. 2. Mild pulmonary vascular congestion. Electronically Signed   By: Titus Dubin M.D.   On: 02/28/2020 15:49    EKG: Independently reviewed.  Multiple artifacts versus atrial fibrillation/atrial flutter, rate is regular.  Repeat EKG showing sinus rhythm.  Assessment/Plan Principal Problem:   AKI (acute kidney injury) ([place]) Active Problems:   HTN (hypertension)   Kidney disease, chronic, stage III (GFR 30-59 ml/min)   Diastolic congestive heart failure, NYHA class 1 (HCC)   H/O: CVA (cerebrovascular accident)   Dementia ([place])   Type II diabetes mellitus with neurological manifestations ([place])   Essential hypertension, benign   Diarrhea   Acute kidney injury-creatinine 3.6, BUN of 60, 7 days ago, her creatinine was 1.1.  Likely due  to hypotension from diarrhea, poor p.o. intake in the setting of ongoing losartan 100 and Lasix 40 mg use.  Recent abdominal/pelvic CT with contrast shows normal sized kidneys without hydronephrosis or nephrolithiasis. -3 L bolus given, continue  gentle hydration with D5 N/s + 50cc/hr x 15hrs -Hold Lasix, losartan -BMP a.m.  Urinary tract infection- urine culture 02/26/20 grew Citrobacter koseri.  She is afebrile without leukocytosis, normal lactic acid.  She rules out for sepsis.  Hypotension is likely from chronic diarrhea, poor p.o. intake and diuretics. -Continue IV ceftriaxone 1 g daily -Follow-up blood cultures, repeat urine cultures ordered in the ED  Diarrhea-chronic, ongoing for up to 8 weeks now.  Evaluated by gastroenterology for same 5//21.  Work-up included negative if stool C. difficile, GI pathogen panel, unremarkable TSH, IgA and tissue transglutaminase.  Abdominal CT showed calcification over the body of the pancreas.  Plan was to start creon.  -Resume Creon -Hydration -Further GI work-up as outpatient -As needed Imodium  Acute hypoxic respiratory failure- on my evaluation O2 sats down to 89% on room air, improved with 3 L O2. Denies dyspnea or cough. Chest x-ray showing- mild pulmonary vascular congestion, left upper lobe nodule, chest CT recommended. -Obtain CT chest without contrast -Supplemental O2 -Obtain BNP -Hypotensive required 3 L of fluid, continue very gentle hydration.  Diastolic CHF-appears mildly decompensated, unable to view 2014 echo results.  Chest x-ray suggesting pulmonary vascular congestion.  Currently hypotensive requiring IV fluids. -Hold Lasix -Obtain echocardiogram, BNP -Would likely require diuresis prior to discharge.  Diabetes mellitus-with hypoglycemia random glucose 47, due to poor p.o. intake, hypoglycemic agents .  01/28/20 hemoglobin A1c 7.4. - Blood sugars persistently low, will add D5 to fluids -Hold home medications Lantus, Tradjenta,  sliding scale insulin - SSI- s  Vascular dementia, stroke -Resume aspirin, statins, donepezil, Celexa  Hypertension-hypotensive, requiring IV fluids for now. -Hold Norvasc, carvedilol, Lasix, losartan.  Recent abnormal CT abdomen/pelvis findings- 02/21/20-  A few indistinct hypodensities over the liver with the best defined over the posterior right lobe measuring 3.1 cm. Findings are indeterminate as metastatic disease is possible. This could also be further evaluated with MRI. -Being followed by gastroenterology, follow-up as outpatient.   DVT prophylaxis: Heparin Code Status: Full code, confirmed with sister nanny at bedside. Family Communication: Patient 69 year old bedside contact #[phone]. Disposition Plan: > 2 days, pending improvement in renal function cardiovascular status . Consults called: None  admission status: Inpatient, telemetry I certify that at the point of admission it is my clinical judgment that the patient will require inpatient hospital care spanning beyond 2 midnights from the point of admission due to high intensity of service, high risk for further deterioration and high frequency of surveillance required. The following factors support the patient status of inpatient:    Bethena Roys MD [facility]  02/28/2020, 8:07 PM

## 2020-02-29 ENCOUNTER — Inpatient Hospital Stay (HOSPITAL_COMMUNITY): Payer: Medicare Other

## 2020-02-29 DIAGNOSIS — I5030 Unspecified diastolic (congestive) heart failure: Secondary | ICD-10-CM

## 2020-02-29 LAB — GLUCOSE, CAPILLARY
Glucose-Capillary: 134 mg/dL — ABNORMAL HIGH (ref 70–99)
Glucose-Capillary: 194 mg/dL — ABNORMAL HIGH (ref 70–99)
Glucose-Capillary: 197 mg/dL — ABNORMAL HIGH (ref 70–99)
Glucose-Capillary: 222 mg/dL — ABNORMAL HIGH (ref 70–99)
Glucose-Capillary: 346 mg/dL — ABNORMAL HIGH (ref 70–99)

## 2020-02-29 LAB — ECHOCARDIOGRAM COMPLETE
Height: 64 in
Weight: 3571.2 oz

## 2020-02-29 LAB — HIV ANTIBODY (ROUTINE TESTING W REFLEX): HIV Screen 4th Generation wRfx: NONREACTIVE

## 2020-02-29 LAB — BASIC METABOLIC PANEL
Anion gap: 9 (ref 5–15)
BUN: 49 mg/dL — ABNORMAL HIGH (ref 8–23)
CO2: 20 mmol/L — ABNORMAL LOW (ref 22–32)
Calcium: 8.2 mg/dL — ABNORMAL LOW (ref 8.9–10.3)
Chloride: 112 mmol/L — ABNORMAL HIGH (ref 98–111)
Creatinine, Ser: 2.86 mg/dL — ABNORMAL HIGH (ref 0.44–1.00)
GFR calc Af Amer: 19 mL/min — ABNORMAL LOW (ref >60)
GFR calc non Af Amer: 16 mL/min — ABNORMAL LOW (ref >60)
Glucose, Bld: 271 mg/dL — ABNORMAL HIGH (ref 70–99)
Potassium: 4.1 mmol/L (ref 3.5–5.1)
Sodium: 141 mmol/L (ref 135–145)

## 2020-02-29 NOTE — Plan of Care (Signed)
  Problem: Education: Goal: Knowledge of General Education information will improve Description: Including pain rating scale, medication(s)/side effects and non-pharmacologic comfort measures Outcome: Progressing   Problem: Clinical Measurements: Goal: Ability to maintain clinical measurements within normal limits will improve Outcome: Progressing   

## 2020-02-29 NOTE — Care Management Important Message (Signed)
Important Message  Patient Details  Name: Kathryn Cook MRN: [id number] Date of Birth: 1951-06-12   Medicare Important Message Given:  Yes     Tommy Medal 02/29/2020, 1:02 PM

## 2020-02-29 NOTE — Progress Notes (Signed)
Patient Demographics:    Kathryn Cook, is a 69 y.o. female, DOB - December 04, 1950, HBZ:[id number]  Admit date - 02/28/2020   Admitting Physician Ejiroghene Arlyce Dice, MD  Outpatient Primary MD for the patient is [name], MD  LOS - 1   Chief Complaint  Patient presents with  . Urinary Tract Infection        Subjective:    [name] today has no fevers, no emesis,  No chest pain,   Sleepy, but wakes up to answer questions appropriately  Assessment  & Plan :    Principal Problem:   AKI (acute kidney injury) ([place]) Active Problems:   HTN (hypertension)   Kidney disease, chronic, stage III (GFR 30-59 ml/min)   Diastolic congestive heart failure, NYHA class 1 (HCC)   H/O: CVA (cerebrovascular accident)   Dementia ([place])   Type II diabetes mellitus with neurological manifestations ([place])   Essential hypertension, benign   Diarrhea  Brief Summary:- 69 y.o. female with medical history significant for diastolic CHF, CKD, diabetes mellitus, dementia, CVA, hypertension admitted on 02/28/20 with AKI in the setting of diarrhea   A/p 1)AKI----acute kidney injury on CKD stage - IIIB   creatinine on admission= 3.6, baseline creatinine = around 2    , creatinine is now= 2.8, renally adjust medications, avoid nephrotoxic agents / dehydration  / hypotension  -Worsening renal function related to dehydration in the setting of diarrhea and GI losses  2)HFpEF--Echo with EF of 65 to 70% and grade 1 diastolic dysfunction--- be judicious with IV fluids  3) chronic diarrhea--27-month duration , extensive outpatient GI work-up, C. difficile and GI pathogen negative as outpatient -Completed treatment with Creon -Patient is to follow-up as outpatient with GI for ongoing work-up  4) Citrobacter koseri UTI--continue IV Rocephin, reluctant to switch to Augmentin due to increased risk for diarrhea  5)Acute hypoxic  respiratory failure----CT chest without contrast suggest possible primary lung cancer with a spiculated nodule in the left upper lobe -Outpatient PET scans recommended  6)H/o Vascular dementia and prior CVA-aspirin, statin, Aricept and Celexa  7)Recent abnormal CT abdomen/pelvis findings- 02/21/20-  A few indistinct hypodensities over the liver with the best defined over the posterior right lobe measuring 3.1 cm. Findings are indeterminate as metastatic disease is possible. This could also be further evaluated with MRI. -Being followed by gastroenterology, follow-up as outpatient.  8)HTN--- hypotension resolved with IV fluids, Norvasc Coreg Lasix and losartan currently on hold  9)DM2-A1c 7.4 reflecting uncontrolled DM PTA -Hold Lantus and Tradjenta Use Novolog/Humalog Sliding scale insulin with Accu-Cheks/Fingersticks as ordered    Disposition/Need for in-Hospital Stay- patient unable to be discharged at this time due to --acute kidney injury and dehydration requiring continuous IV fluids and close monitoring due to risk for volume overload and CHF exacerbation  Status is: Inpatient  Remains inpatient appropriate because:Altered mental status, IV treatments appropriate due to intensity of illness or inability to take PO and Inpatient level of care appropriate due to severity of illness   Dispo: The patient is from: Home              Anticipated d/c is to: Group home              Anticipated d/c date is: 2  days              Patient currently is not medically stable to d/c.  -acute kidney injury and dehydration requiring continuous IV fluids and close monitoring due to risk for volume overload and CHF exacerbation Barriers: Not Clinically Stable-   Code Status :   Family Communication:   From group home Consults  :  na  DVT Prophylaxis  :   - Heparin - SCDs   Lab Results  Component Value Date   PLT 243 02/28/2020    Inpatient Medications  Scheduled Meds: . aspirin EC  325 mg  Oral Daily  . citalopram  40 mg Oral Daily  . donepezil  10 mg Oral QHS  . heparin  5,000 Units Subcutaneous Q8H  . insulin aspart  0-5 Units Subcutaneous QHS  . insulin aspart  0-9 Units Subcutaneous TID WC  . lipase/protease/amylase  24,000 Units Oral TID WC  . umeclidinium bromide  1 puff Inhalation Daily   Continuous Infusions: . cefTRIAXone (ROCEPHIN)  IV 2 g (02/29/20 1742)   PRN Meds:.acetaminophen **OR** acetaminophen, ipratropium-albuterol, loperamide, ondansetron **OR** ondansetron (ZOFRAN) IV, polyethylene glycol    Anti-infectives (From admission, onward)   Start     Dose/Rate Route Frequency Ordered Stop   02/29/20 1800  cefTRIAXone (ROCEPHIN) 2 g in sodium chloride 0.9 % 100 mL IVPB     2 g 200 mL/hr over 30 Minutes Intravenous Every 24 hours 02/28/20 2005     02/28/20 1915  cefTRIAXone (ROCEPHIN) 1 g in sodium chloride 0.9 % 100 mL IVPB    Note to Pharmacy: Dose adjusted for weight.   1 g 200 mL/hr over 30 Minutes Intravenous  Once 02/28/20 1904 02/28/20 2130   02/28/20 1600  cefTRIAXone (ROCEPHIN) 1 g in sodium chloride 0.9 % 100 mL IVPB     1 g 200 mL/hr over 30 Minutes Intravenous  Once 02/28/20 1511 02/28/20 1627        Objective:   Vitals:   02/29/20 0348 02/29/20 0418 02/29/20 0804 02/29/20 1755  BP: 105/74   119/74  Pulse: 73   71  Resp: 18   18  Temp: 98.1 F (36.7 C)   98.2 F (36.8 C)  TempSrc: Oral   Oral  SpO2: 96%  97% 93%  Weight:  101.2 kg    Height:        Wt Readings from Last 3 Encounters:  02/29/20 101.2 kg  02/04/20 104.3 kg  01/28/20 106.4 kg     Intake/Output Summary (Last 24 hours) at 02/29/2020 1933 Last data filed at 02/29/2020 1900 Gross per 24 hour  Intake 1188.59 ml  Output 1700 ml  Net -511.41 ml     Physical Exam Gen:-Sleepy on and off in no apparent distress  HEENT:- [place].AT, No sclera icterus Neck-Supple Neck,No JVD,.  Lungs-  CTAB , fair symmetrical air movement CV- S1, S2 normal, regular  Abd-  +ve  B.Sounds, Abd Soft, No tenderness, no CVA area tenderness Extremity/Skin:- No  edema, pedal pulses present  Psych-affect is appropriate, oriented x3 Neuro-generalized weakness, no new focal deficits, no tremors   Data Review:   Micro Results Recent Results (from the past 240 hour(s))  Urine Culture     Status: Abnormal   Collection Time: 02/26/20 10:39 AM   Specimen: Urine  Result Value Ref Range Status   MICRO NUMBER: [id number]  Final   SPECIMEN QUALITY: Adequate  Final   Sample Source URINE  Final   STATUS: FINAL  Final   ISOLATE 1: Citrobacter koseri (A)  Final    Comment: Greater than 100,000 CFU/mL of Citrobacter koseri      Susceptibility   Citrobacter koseri - URINE CULTURE, REFLEX    AMOX/CLAVULANIC <=2 Sensitive     CEFAZOLIN* <=4 Not Reportable      * For infections other than uncomplicated UTIcaused by E. coli, K. pneumoniae or P. mirabilis:Cefazolin is resistant if MIC > or = 8 mcg/mL.(Distinguishing susceptible versus intermediatefor isolates with MIC < or = 4 mcg/mL requiresadditional testing.)For uncomplicated UTI caused by E. coli,K. pneumoniae or P. mirabilis: Cefazolin issusceptible if MIC <32 mcg/mL and predictssusceptible to the oral agents cefaclor, cefdinir,cefpodoxime, cefprozil, cefuroxime, cephalexinand loracarbef.    CEFEPIME <=1 Sensitive     CEFTRIAXONE <=1 Sensitive     CIPROFLOXACIN <=0.25 Sensitive     LEVOFLOXACIN <=0.12 Sensitive     ERTAPENEM <=0.5 Sensitive     GENTAMICIN <=1 Sensitive     IMIPENEM <=0.25 Sensitive     NITROFURANTOIN 32 Sensitive     PIP/TAZO <=4 Sensitive     TOBRAMYCIN <=1 Sensitive     TRIMETH/SULFA* <=20 Sensitive      * For infections other than uncomplicated UTIcaused by E. coli, K. pneumoniae or P. mirabilis:Cefazolin is resistant if MIC > or = 8 mcg/mL.(Distinguishing susceptible versus intermediatefor isolates with MIC < or = 4 mcg/mL requiresadditional testing.)For uncomplicated UTI caused by E. coli,K. pneumoniae or P.  mirabilis: Cefazolin issusceptible if MIC <32 mcg/mL and predictssusceptible to the oral agents cefaclor, cefdinir,cefpodoxime, cefprozil, cefuroxime, cephalexinand loracarbef.Legend:S = Susceptible  I = IntermediateR = Resistant  NS = Not susceptible* = Not tested  NR = Not reported**NN = See antimicrobic comments  Blood Culture (routine x 2)     Status: None (Preliminary result)   Collection Time: 02/28/20  3:23 PM   Specimen: Right Antecubital; Blood  Result Value Ref Range Status   Specimen Description RIGHT ANTECUBITAL  Final   Special Requests   Final    BOTTLES DRAWN AEROBIC AND ANAEROBIC Blood Culture adequate volume   Culture   Final    NO GROWTH < 24 HOURS Performed at [facility], [address] [place], [place] [address]    Report Status PENDING  Incomplete  Blood Culture (routine x 2)     Status: None (Preliminary result)   Collection Time: 02/28/20  3:29 PM   Specimen: Left Antecubital; Blood  Result Value Ref Range Status   Specimen Description LEFT ANTECUBITAL  Final   Special Requests   Final    BOTTLES DRAWN AEROBIC AND ANAEROBIC Blood Culture adequate volume   Culture   Final    NO GROWTH < 24 HOURS Performed at [facility], [address], [place], [place] [address]    Report Status PENDING  Incomplete  SARS Coronavirus 2 by RT PCR (hospital order, performed in [place] hospital lab) Nasopharyngeal Nasopharyngeal Swab     Status: None   Collection Time: 02/28/20  4:08 PM   Specimen: Nasopharyngeal Swab  Result Value Ref Range Status   SARS Coronavirus 2 NEGATIVE NEGATIVE Final    Comment: (NOTE) SARS-CoV-2 target nucleic acids are NOT DETECTED. The SARS-CoV-2 RNA is generally detectable in upper and lower respiratory specimens during the acute phase of infection. The lowest concentration of SARS-CoV-2 viral copies this assay can detect is 250 copies / mL. A negative result does not preclude SARS-CoV-2 infection and should not be used as the sole basis for  treatment or other patient management  decisions.  A negative result may occur with improper specimen collection / handling, submission of specimen other than nasopharyngeal swab, presence of viral mutation(s) within the areas targeted by this assay, and inadequate number of viral copies (<250 copies / mL). A negative result must be combined with clinical observations, patient history, and epidemiological information. Fact Sheet for Patients:   StrictlyIdeas.no Fact Sheet for Healthcare Providers: BankingDealers.co.za This test is not yet approved or cleared  by the [place] FDA and has been authorized for detection and/or diagnosis of SARS-CoV-2 by FDA under an Emergency Use Authorization (EUA).  This EUA will remain in effect (meaning this test can be used) for the duration of the COVID-19 declaration under Section 564(b)(1) of the Act, 21 U.S.C. section 360bbb-3(b)(1), unless the authorization is terminated or revoked sooner. Performed at [facility], [address], [place], [place] [address]   Urine culture     Status: Abnormal (Preliminary result)   Collection Time: 02/28/20  5:40 PM   Specimen: In/Out Cath Urine  Result Value Ref Range Status   Specimen Description   Final    IN/OUT CATH URINE Performed at [facility], [address], [place], [place] [address]    Special Requests   Final    NONE Performed at [facility], [address], [place], [place] [address]    Culture (A)  Final    20,000 COLONIES/mL ENTEROCOCCUS FAECALIS SUSCEPTIBILITIES TO FOLLOW Performed at [facility], [place] [address], [place], [place] [address]    Report Status PENDING  Incomplete    Radiology Reports CT ABDOMEN PELVIS WO CONTRAST  Result Date: 02/21/2020 CLINICAL DATA:  Diarrhea with weight loss.  Diabetes. EXAM: CT ABDOMEN AND PELVIS WITHOUT CONTRAST TECHNIQUE: Multidetector CT imaging of the abdomen and pelvis was performed  following the standard protocol without IV contrast. COMPARISON:  None. FINDINGS: Lower chest: Lung bases demonstrate linear scarring over the left lower lobe. There is a 5 x 11 mm nodule over the right lower lobe as well as a 4 mm subpleural nodule over the right middle lobe. Hepatobiliary: Suggestion of several indistinct hypodense liver lesions with the best defined over the posterior right lobe measuring 3.1 cm. Possible 1.6 cm hyperdense nodule over the dome of the medial segment left lobe of the liver. Gallbladder and biliary tree are unremarkable. Pancreas: Prominence with minimal associated calcification over the body of the pancreas. Spleen: Normal. Adrenals/Urinary Tract: Adrenal glands are normal. Kidneys are normal in size without hydronephrosis or nephrolithiasis. No focal renal mass. Ureters are normal. Single focus of air over the anterior bladder. Stomach/Bowel: Stomach and small bowel are normal. Appendix is normal. Colon is unremarkable. Vascular/Lymphatic: Minimal calcified plaque over the abdominal aorta which is normal in caliber. Few small nonspecific lymph nodes over the upper abdomen over the gastrohepatic ligament and Peri pancreatic region. Reproductive: Uterus is right of midline.  Ovaries are normal. Other: No free fluid or focal inflammatory change. Musculoskeletal: Mild degenerative changes of the spine and hips. IMPRESSION: 1.  No acute findings in the abdomen/pelvis. 2. Focal prominence with associated calcification over the body of the pancreas. Few small nonspecific adjacent lymph nodes. Recommend further evaluation with MRI. 3. A few indistinct hypodensities over the liver with the best defined over the posterior right lobe measuring 3.1 cm. Findings are indeterminate as metastatic disease is possible. This could also be further evaluated with MRI. 4. Two nodules over the right mid to lower lung with the larger over the lateral right lower  lobe measuring 8 mm. Recommend chest CT  with contrast for complete evaluation of the lungs and thorax. 5.  Aortic Atherosclerosis (ICD10-I70.0). 6. Tiny focus of air over the anterior bladder which may be due to recent instrumentation although can be seen due to infection or enteric fistula. Electronically Signed   By: Marin Olp M.D.   On: 02/21/2020 16:38   CT CHEST WO CONTRAST  Result Date: 02/28/2020 CLINICAL DATA:  Respiratory failure.  Pulmonary nodules EXAM: CT CHEST WITHOUT CONTRAST TECHNIQUE: Multidetector CT imaging of the chest was performed following the standard protocol without IV contrast. COMPARISON:  CT abdomen 02/21/2020 FINDINGS: Cardiovascular: Heart is normal size. Aorta is normal caliber. Aortic atherosclerosis. Mediastinum/Nodes: No mediastinal, hilar, or axillary adenopathy. Lungs/Pleura: 13 mm spiculated nodule in the left upper lobe on image 28. Right lower lobe nodule as seen on prior abdominal CT measures 8 mm on image 64. Small subpleural nodule in the right middle lobe measures 4 mm. Mild elevation of the right hemidiaphragm. Bibasilar atelectasis or scarring. No effusions. Upper Abdomen: Ill-defined hypodensities again noted in the liver, best seen in the posterior right hepatic lobe measuring approximately 3.3 cm, similar to prior abdominal CT. Musculoskeletal: Chest wall soft tissues are unremarkable. No acute bony abnormality. IMPRESSION: Previously seen nodules in the right lower lobe and right middle lobe as seen on prior abdominal CT. There is also a spiculated 13 mm nodule in the left upper lobe. Appearance is concerning for possible primary lung cancer. Consider further evaluation with PET CT. Ill-defined low-density lesions in the liver as seen on prior abdominal CT. Cannot exclude metastatic disease. Aortic Atherosclerosis (ICD10-I70.0). Electronically Signed   By: Rolm Baptise M.D.   On: 02/28/2020 20:55   DG Chest Port 1 View  Result Date: 02/28/2020 CLINICAL DATA:  Shortness of breath. EXAM: PORTABLE  CHEST 1 VIEW COMPARISON:  Chest x-ray dated July 19, 2018. FINDINGS: The heart size and mediastinal contours are within normal limits. Pulmonary vascular congestion with chronically coarse interstitial markings. New 1.5 cm nodule in the left upper lobe. No focal consolidation, pleural effusion, or pneumothorax. No acute osseous abnormality. IMPRESSION: 1. New 1.5 cm nodule in the left upper lobe. Chest CT is recommended for further evaluation. 2. Mild pulmonary vascular congestion. Electronically Signed   By: Titus Dubin M.D.   On: 02/28/2020 15:49   ECHOCARDIOGRAM COMPLETE  Result Date: 02/29/2020    ECHOCARDIOGRAM REPORT   Patient Name:   ELAINAH RHYNE Date of Exam: 02/29/2020 Medical Rec #:  [id number]           Height:       64.0 in Accession #:    [id number]          Weight:       223.2 lb Date of Birth:  05/05/1951           BSA:          2.050 m Patient Age:    54 years            BP:           105/74 mmHg Patient Gender: F                   HR:           73 bpm. Exam Location:  [facility] Procedure: 2D Echo Indications:    Congestive Heart Failure 428.0 / I50.9  History:        Patient has prior history of Echocardiogram  examinations, most                 recent 06/11/2013. CHF, Stroke; Risk Factors:Hypertension,                 Dyslipidemia, Diabetes and Former Smoker. LVH.  Sonographer:    Leavy Cella RDCS (AE) Referring Phys: [place]  1. Left ventricular ejection fraction, by estimation, is 65 to 70%. The left ventricle has normal function. The left ventricle has no regional wall motion abnormalities. There is mild left ventricular hypertrophy. Left ventricular diastolic parameters are consistent with Grade I diastolic dysfunction (impaired relaxation).  2. Right ventricular systolic function is low normal. The right ventricular size is mildly enlarged. There is mildly elevated pulmonary artery systolic pressure.  3. The mitral valve is normal in structure. No  evidence of mitral valve regurgitation. No evidence of mitral stenosis.  4. The aortic valve is tricuspid. Aortic valve regurgitation is not visualized. No aortic stenosis is present. FINDINGS  Left Ventricle: Left ventricular ejection fraction, by estimation, is 65 to 70%. The left ventricle has normal function. The left ventricle has no regional wall motion abnormalities. The left ventricular internal cavity size was normal in size. There is  mild left ventricular hypertrophy. Left ventricular diastolic parameters are consistent with Grade I diastolic dysfunction (impaired relaxation). Normal left ventricular filling pressure. Right Ventricle: The right ventricular size is mildly enlarged. Right vetricular wall thickness was not assessed. Right ventricular systolic function is low normal. There is mildly elevated pulmonary artery systolic pressure. The tricuspid regurgitant velocity is 2.86 m/s, and with an assumed right atrial pressure of 10 mmHg, the estimated right ventricular systolic pressure is 77.4 mmHg. Left Atrium: Left atrial size was normal in size. Right Atrium: Right atrial size was not well visualized. Pericardium: There is no evidence of pericardial effusion. Mitral Valve: The mitral valve is normal in structure. No evidence of mitral valve regurgitation. No evidence of mitral valve stenosis. Tricuspid Valve: The tricuspid valve is normal in structure. Tricuspid valve regurgitation is mild . No evidence of tricuspid stenosis. Aortic Valve: The aortic valve is tricuspid. Aortic valve regurgitation is not visualized. No aortic stenosis is present. Aortic valve mean gradient measures 4.0 mmHg. Aortic valve peak gradient measures 7.4 mmHg. Aortic valve area, by VTI measures 2.61 cm. Pulmonic Valve: The pulmonic valve was not well visualized. Pulmonic valve regurgitation is not visualized. No evidence of pulmonic stenosis. Aorta: The aortic root is normal in size and structure. Pulmonary Artery:  Indeterminant PASP, IVC poorly visualized. Venous: The inferior vena cava was not well visualized. IAS/Shunts: The interatrial septum was not well visualized.  LEFT VENTRICLE PLAX 2D LVIDd:         4.15 cm  Diastology LVIDs:         1.76 cm  LV e' lateral:   8.38 cm/s LV PW:         1.12 cm  LV E/e' lateral: 4.3 LV IVS:        1.17 cm  LV e' medial:    5.66 cm/s LVOT diam:     2.00 cm  LV E/e' medial:  6.4 LV SV:         55 LV SV Index:   27 LVOT Area:     3.14 cm  RIGHT VENTRICLE RV S prime:     8.05 cm/s TAPSE (M-mode): 1.6 cm LEFT ATRIUM           Index  RIGHT ATRIUM           Index LA diam:      2.10 cm 1.02 cm/m  RA Area:     13.30 cm LA Vol (A2C): 21.8 ml 10.64 ml/m RA Volume:   36.20 ml  17.66 ml/m LA Vol (A4C): 32.1 ml 15.66 ml/m  AORTIC VALVE AV Area (Vmax):    2.37 cm AV Area (Vmean):   2.25 cm AV Area (VTI):     2.61 cm AV Vmax:           135.74 cm/s AV Vmean:          96.225 cm/s AV VTI:            0.210 m AV Peak Grad:      7.4 mmHg AV Mean Grad:      4.0 mmHg LVOT Vmax:         102.35 cm/s LVOT Vmean:        68.803 cm/s LVOT VTI:          0.174 m LVOT/AV VTI ratio: 0.83  AORTA Ao Root diam: 3.00 cm MITRAL VALVE               TRICUSPID VALVE MV Area (PHT): 1.98 cm    TR Peak grad:   32.7 mmHg MV Decel Time: 384 msec    TR Vmax:        286.00 cm/s MV E velocity: 36.40 cm/s MV A velocity: 75.20 cm/s  SHUNTS MV E/A ratio:  0.48        Systemic VTI:  0.17 m                            Systemic Diam: 2.00 cm Carlyle Dolly MD Electronically signed by Carlyle Dolly MD Signature Date/Time: 02/29/2020/10:45:25 AM    Final      CBC Recent Labs  Lab 02/26/20 1039 02/28/20 1522  WBC 11.4* 9.5  HGB 12.9 12.5  HCT 41.3 40.6  PLT 235 243  MCV 85.9 85.5  MCH 26.8* 26.3  MCHC 31.2* 30.8  RDW 14.9 16.5*  LYMPHSABS 1,562 2.0  MONOABS  --  0.9  EOSABS 125 0.1  BASOSABS 80 0.1    Chemistries  Recent Labs  Lab 02/26/20 1039 02/28/20 1522 02/29/20 0551  NA 142 139 141  K 3.9 3.7  4.1  CL 107 110 112*  CO2 21 17* 20*  GLUCOSE 237* 47* 271*  BUN 56* 60* 49*  CREATININE 2.88* 3.60* 2.86*  CALCIUM 9.9 9.3 8.2*  AST 13 15  --   ALT 10 12  --   ALKPHOS  --  84  --   BILITOT 0.5 0.5  --    ------------------------------------------------------------------------------------------------------------------ No results for input(s): CHOL, HDL, LDLCALC, TRIG, CHOLHDL, LDLDIRECT in the last 72 hours.  Lab Results  Component Value Date   HGBA1C 7.4 (A) 01/28/2020   ------------------------------------------------------------------------------------------------------------------ No results for input(s): TSH, T4TOTAL, T3FREE, THYROIDAB in the last 72 hours.  Invalid input(s): FREET3 ------------------------------------------------------------------------------------------------------------------ No results for input(s): VITAMINB12, FOLATE, FERRITIN, TIBC, IRON, RETICCTPCT in the last 72 hours.  Coagulation profile Recent Labs  Lab 02/28/20 1522  INR 1.3*    No results for input(s): DDIMER in the last 72 hours.  Cardiac Enzymes No results for input(s): CKMB, TROPONINI, MYOGLOBIN in the last 168 hours.  Invalid input(s): CK ------------------------------------------------------------------------------------------------------------------    Component Value Date/Time   BNP 356.0 (H) 02/28/2020 1522   Benjiman Sedgwick  M.D on 02/29/2020 at 7:33 PM  Go to [email or web address] - for contact info  [facility] - Office  [phone]

## 2020-02-29 NOTE — Progress Notes (Signed)
*  PRELIMINARY RESULTS* Echocardiogram 2D Echocardiogram has been performed.  Leavy Cella 02/29/2020, 10:17 AM

## 2020-02-29 NOTE — Progress Notes (Signed)
Inpatient Diabetes Program Recommendations  AACE/ADA: New Consensus Statement on Inpatient Glycemic Control   Target Ranges:  Prepandial:   less than 140 mg/dL      Peak postprandial:   less than 180 mg/dL (1-2 hours)      Critically ill patients:  140 - 180 mg/dL  Results for REBEKKA, LOBELLO (MRN [id number]) as of 02/29/2020 07:41  Ref. Range 02/29/2020 05:51  Glucose Latest Ref Range: 70 - 99 mg/dL 271 (H)   Results for MALEEHA, HALLS (MRN [id number]) as of 02/29/2020 07:41  Ref. Range 02/28/2020 18:03 02/28/2020 18:33 02/28/2020 19:38 02/29/2020 00:12  Glucose-Capillary Latest Ref Range: 70 - 99 mg/dL 44 (LL) 88 73 197 (H)  Results for DANIAH, ZALDIVAR (MRN [id number]) as of 02/29/2020 07:41  Ref. Range 01/28/2020 15:18  Hemoglobin A1C Latest Ref Range: 4.0 - 5.6 % 7.4 (A)   Review of Glycemic Control  Diabetes history: DM2 Outpatient Diabetes medications: Basaglar 50 units QHS, Tradjenta 5 mg daily, Novolog 10-16 units TID with meals Current orders for Inpatient glycemic control: Novolog 0-9 units TID with meals, Novolog 0-5 units QHS  Inpatient Diabetes Program Recommendations:   IV fluids: If appropriate, please consider discontinuing dextrose from IV fluids.  Insulin-Basal: Presented with initial hypoglycemia which has resolved and lab glucose 271 mg/d this morning. If glucose continues to be consistently over 180 mg/dl with Novolog correction, please consider ordering low dose Lantus.  Thanks, Barnie Alderman, RN, MSN, CDE Diabetes Coordinator Inpatient Diabetes Program [phone] (Team Pager from 8am to 5pm)

## 2020-03-01 LAB — URINE CULTURE: Culture: 20000 — AB

## 2020-03-01 LAB — GLUCOSE, CAPILLARY
Glucose-Capillary: 175 mg/dL — ABNORMAL HIGH (ref 70–99)
Glucose-Capillary: 241 mg/dL — ABNORMAL HIGH (ref 70–99)
Glucose-Capillary: 302 mg/dL — ABNORMAL HIGH (ref 70–99)
Glucose-Capillary: 167 mg/dL — ABNORMAL HIGH (ref 70–99)

## 2020-03-01 MED ORDER — SODIUM CHLORIDE 0.9 % IV SOLN: INTRAVENOUS | Status: DC

## 2020-03-01 MED ORDER — INSULIN GLARGINE 100 UNIT/ML ~~LOC~~ SOLN
20.0000 [IU] | Freq: Every day | SUBCUTANEOUS | Status: DC
  Administered 2020-03-01 – 2020-03-06 (×5): 20 [IU] via SUBCUTANEOUS
  Filled 2020-03-01 (×9): qty 0.2

## 2020-03-01 NOTE — Progress Notes (Signed)
Patient Demographics:    Kathryn Cook, is a 69 y.o. female, DOB - Feb 18, 1951, ZOX:[id number]  Admit date - 02/28/2020   Admitting Physician Ejiroghene Arlyce Dice, MD  Outpatient Primary MD for the patient is [name], MD  LOS - 2   Chief Complaint  Patient presents with  . Urinary Tract Infection        Subjective:    [facility] today has no fevers, no emesis,  No chest pain,   More awake, more interactive, frequency and consistency of have improved significantly  Assessment  & Plan :    Principal Problem:   AKI (acute kidney injury) ([place]) Active Problems:   HTN (hypertension)   Kidney disease, chronic, stage III (GFR 30-59 ml/min)   Diastolic congestive heart failure, NYHA class 1 (HCC)   H/O: CVA (cerebrovascular accident)   Dementia ([place])   Type II diabetes mellitus with neurological manifestations ([place])   Essential hypertension, benign   Diarrhea  Brief Summary:- 69 y.o. female with medical history significant for diastolic CHF, CKD, diabetes mellitus, dementia, CVA, hypertension admitted on 02/28/20 with AKI in the setting of diarrhea   A/p 1)AKI----acute kidney injury on CKD stage - IIIB   creatinine on admission= 3.6, baseline creatinine = around 2    , creatinine is now= 2.8, renally adjust medications, avoid nephrotoxic agents / dehydration  / hypotension  -Worsening renal function related to dehydration in the setting of diarrhea and GI losses  2)HFpEF--Echo with EF of 65 to 70% and grade 1 diastolic dysfunction--- be judicious with IV fluids  3) chronic diarrhea--71-month duration , extensive outpatient GI work-up, C. difficile and GI pathogen negative as outpatient -Completed treatment with Creon -Patient is to follow-up as outpatient with GI for ongoing work-up  4)Citrobacter koseri UTI-- continue IV Rocephin, reluctant to switch to Augmentin due to increased risk  for diarrhea  5)Acute hypoxic respiratory failure----CT chest without contrast suggest possible primary lung cancer with a spiculated nodule in the left upper lobe -Outpatient PET scans recommended  6)H/o Vascular dementia and prior CVA-aspirin, statin, Aricept and Celexa  7)Recent abnormal CT abdomen/pelvis findings- 02/21/20-  A few indistinct hypodensities over the liver with the best defined over the posterior right lobe measuring 3.1 cm. Findings are indeterminate as metastatic disease is possible. This could also be further evaluated with MRI. -Being followed by gastroenterology, follow-up as outpatient. -MRI not available at [facility] until  Tuesday 03/04/20  8)HTN--- hypotension resolved with IV fluids, Norvasc Coreg Lasix and losartan currently on hold  9)DM2-A1c 7.4 reflecting uncontrolled DM PTA -Hold   Tradjenta -Restart Lantus at 20 units nightly, patient was on 50 units PTA Use Novolog/Humalog Sliding scale insulin with Accu-Cheks/Fingersticks as ordered    Disposition/Need for in-Hospital Stay- patient unable to be discharged at this time due to --acute kidney injury and dehydration requiring continuous IV fluids and close monitoring due to risk for volume overload and CHF exacerbation  Status is: Inpatient  Remains inpatient appropriate because:Altered mental status, IV treatments appropriate due to intensity of illness or inability to take PO and Inpatient level of care appropriate due to severity of illness   Dispo: The patient is from: Home  Anticipated d/c is to: Group home              Anticipated d/c date is: 2 days              Patient currently is not medically stable to d/c.  -acute kidney injury and dehydration requiring continuous IV fluids and close monitoring due to risk for volume overload and CHF exacerbation Barriers: Not Clinically Stable-   Code Status :   Family Communication:   From group home-- -  administrator from group home  notified  that MRI currently not available here Consults  :  na  DVT Prophylaxis  :   - Heparin - SCDs   Lab Results  Component Value Date   PLT 243 02/28/2020    Inpatient Medications  Scheduled Meds: . aspirin EC  325 mg Oral Daily  . citalopram  40 mg Oral Daily  . donepezil  10 mg Oral QHS  . heparin  5,000 Units Subcutaneous Q8H  . insulin aspart  0-5 Units Subcutaneous QHS  . insulin aspart  0-9 Units Subcutaneous TID WC  . lipase/protease/amylase  24,000 Units Oral TID WC  . umeclidinium bromide  1 puff Inhalation Daily   Continuous Infusions: . cefTRIAXone (ROCEPHIN)  IV 2 g (02/29/20 1742)   PRN Meds:.acetaminophen **OR** acetaminophen, ipratropium-albuterol, loperamide, ondansetron **OR** ondansetron (ZOFRAN) IV, polyethylene glycol    Anti-infectives (From admission, onward)   Start     Dose/Rate Route Frequency Ordered Stop   02/29/20 1800  cefTRIAXone (ROCEPHIN) 2 g in sodium chloride 0.9 % 100 mL IVPB     2 g 200 mL/hr over 30 Minutes Intravenous Every 24 hours 02/28/20 2005     02/28/20 1915  cefTRIAXone (ROCEPHIN) 1 g in sodium chloride 0.9 % 100 mL IVPB    Note to Pharmacy: Dose adjusted for weight.   1 g 200 mL/hr over 30 Minutes Intravenous  Once 02/28/20 1904 02/28/20 2130   02/28/20 1600  cefTRIAXone (ROCEPHIN) 1 g in sodium chloride 0.9 % 100 mL IVPB     1 g 200 mL/hr over 30 Minutes Intravenous  Once 02/28/20 1511 02/28/20 1627        Objective:   Vitals:   02/29/20 2145 03/01/20 0547 03/01/20 0851 03/01/20 1314  BP:  122/73  124/86  Pulse: 68 72  80  Resp:  18  18  Temp:  98.9 F (37.2 C)  98 F (36.7 C)  TempSrc:  Oral  Oral  SpO2: 97% 99% 99% 95%  Weight:  101.9 kg    Height:        Wt Readings from Last 3 Encounters:  03/01/20 101.9 kg  02/04/20 104.3 kg  01/28/20 106.4 kg     Intake/Output Summary (Last 24 hours) at 03/01/2020 1833 Last data filed at 03/01/2020 1700 Gross per 24 hour  Intake 720 ml  Output 1850 ml  Net  -1130 ml     Physical Exam Gen:-Sleepy on and off in no apparent distress  HEENT:- [place].AT, No sclera icterus Neck-Supple Neck,No JVD,.  Lungs-  CTAB , fair symmetrical air movement CV- S1, S2 normal, regular  Abd-  +ve B.Sounds, Abd Soft, No tenderness, no CVA area tenderness Extremity/Skin:- No  edema, pedal pulses present  Psych-affect is appropriate, oriented x3 Neuro-generalized weakness, no new focal deficits, no tremors   Data Review:   Micro Results Recent Results (from the past 240 hour(s))  Urine Culture     Status: Abnormal   Collection Time: 02/26/20  10:39 AM   Specimen: Urine  Result Value Ref Range Status   MICRO NUMBER: [id number]  Final   SPECIMEN QUALITY: Adequate  Final   Sample Source URINE  Final   STATUS: FINAL  Final   ISOLATE 1: Citrobacter koseri (A)  Final    Comment: Greater than 100,000 CFU/mL of Citrobacter koseri      Susceptibility   Citrobacter koseri - URINE CULTURE, REFLEX    AMOX/CLAVULANIC <=2 Sensitive     CEFAZOLIN* <=4 Not Reportable      * For infections other than uncomplicated UTIcaused by E. coli, K. pneumoniae or P. mirabilis:Cefazolin is resistant if MIC > or = 8 mcg/mL.(Distinguishing susceptible versus intermediatefor isolates with MIC < or = 4 mcg/mL requiresadditional testing.)For uncomplicated UTI caused by E. coli,K. pneumoniae or P. mirabilis: Cefazolin issusceptible if MIC <32 mcg/mL and predictssusceptible to the oral agents cefaclor, cefdinir,cefpodoxime, cefprozil, cefuroxime, cephalexinand loracarbef.    CEFEPIME <=1 Sensitive     CEFTRIAXONE <=1 Sensitive     CIPROFLOXACIN <=0.25 Sensitive     LEVOFLOXACIN <=0.12 Sensitive     ERTAPENEM <=0.5 Sensitive     GENTAMICIN <=1 Sensitive     IMIPENEM <=0.25 Sensitive     NITROFURANTOIN 32 Sensitive     PIP/TAZO <=4 Sensitive     TOBRAMYCIN <=1 Sensitive     TRIMETH/SULFA* <=20 Sensitive      * For infections other than uncomplicated UTIcaused by E. coli, K. pneumoniae or P.  mirabilis:Cefazolin is resistant if MIC > or = 8 mcg/mL.(Distinguishing susceptible versus intermediatefor isolates with MIC < or = 4 mcg/mL requiresadditional testing.)For uncomplicated UTI caused by E. coli,K. pneumoniae or P. mirabilis: Cefazolin issusceptible if MIC <32 mcg/mL and predictssusceptible to the oral agents cefaclor, cefdinir,cefpodoxime, cefprozil, cefuroxime, cephalexinand loracarbef.Legend:S = Susceptible  I = IntermediateR = Resistant  NS = Not susceptible* = Not tested  NR = Not reported**NN = See antimicrobic comments  Blood Culture (routine x 2)     Status: None (Preliminary result)   Collection Time: 02/28/20  3:23 PM   Specimen: Right Antecubital; Blood  Result Value Ref Range Status   Specimen Description RIGHT ANTECUBITAL  Final   Special Requests   Final    BOTTLES DRAWN AEROBIC AND ANAEROBIC Blood Culture adequate volume   Culture   Final    NO GROWTH 2 DAYS Performed at [facility], [address], [place], [place] [address]    Report Status PENDING  Incomplete  Blood Culture (routine x 2)     Status: None (Preliminary result)   Collection Time: 02/28/20  3:29 PM   Specimen: Left Antecubital; Blood  Result Value Ref Range Status   Specimen Description LEFT ANTECUBITAL  Final   Special Requests   Final    BOTTLES DRAWN AEROBIC AND ANAEROBIC Blood Culture adequate volume   Culture   Final    NO GROWTH 2 DAYS Performed at [facility], [address], [place], [place] [address]    Report Status PENDING  Incomplete  SARS Coronavirus 2 by RT PCR (hospital order, performed in [place] hospital lab) Nasopharyngeal Nasopharyngeal Swab     Status: None   Collection Time: 02/28/20  4:08 PM   Specimen: Nasopharyngeal Swab  Result Value Ref Range Status   SARS Coronavirus 2 NEGATIVE NEGATIVE Final    Comment: (NOTE) SARS-CoV-2 target nucleic acids are NOT DETECTED. The SARS-CoV-2 RNA is generally detectable in upper and lower respiratory specimens during the acute  phase of infection. The lowest concentration  of SARS-CoV-2 viral copies this assay can detect is 250 copies / mL. A negative result does not preclude SARS-CoV-2 infection and should not be used as the sole basis for treatment or other patient management decisions.  A negative result may occur with improper specimen collection / handling, submission of specimen other than nasopharyngeal swab, presence of viral mutation(s) within the areas targeted by this assay, and inadequate number of viral copies (<250 copies / mL). A negative result must be combined with clinical observations, patient history, and epidemiological information. Fact Sheet for Patients:   StrictlyIdeas.no Fact Sheet for Healthcare Providers: BankingDealers.co.za This test is not yet approved or cleared  by the [place] FDA and has been authorized for detection and/or diagnosis of SARS-CoV-2 by FDA under an Emergency Use Authorization (EUA).  This EUA will remain in effect (meaning this test can be used) for the duration of the COVID-19 declaration under Section 564(b)(1) of the Act, 21 U.S.C. section 360bbb-3(b)(1), unless the authorization is terminated or revoked sooner. Performed at [facility], [address] [place], [place] [address]   Urine culture     Status: Abnormal   Collection Time: 02/28/20  5:40 PM   Specimen: In/Out Cath Urine  Result Value Ref Range Status   Specimen Description   Final    IN/OUT CATH URINE Performed at [facility], [address], [place], [place] [address]    Special Requests   Final    NONE Performed at [facility], [address] [place], [place] [address]    Culture 20,000 COLONIES/mL ENTEROCOCCUS FAECALIS (A)  Final   Report Status 03/01/2020 FINAL  Final   Organism ID, Bacteria ENTEROCOCCUS FAECALIS (A)  Final      Susceptibility   Enterococcus faecalis - MIC*    AMPICILLIN <=2 SENSITIVE Sensitive     NITROFURANTOIN  64 INTERMEDIATE Intermediate     VANCOMYCIN 2 SENSITIVE Sensitive     * 20,000 COLONIES/mL ENTEROCOCCUS FAECALIS    Radiology Reports CT ABDOMEN PELVIS WO CONTRAST  Result Date: 02/21/2020 CLINICAL DATA:  Diarrhea with weight loss.  Diabetes. EXAM: CT ABDOMEN AND PELVIS WITHOUT CONTRAST TECHNIQUE: Multidetector CT imaging of the abdomen and pelvis was performed following the standard protocol without IV contrast. COMPARISON:  None. FINDINGS: Lower chest: Lung bases demonstrate linear scarring over the left lower lobe. There is a 5 x 11 mm nodule over the right lower lobe as well as a 4 mm subpleural nodule over the right middle lobe. Hepatobiliary: Suggestion of several indistinct hypodense liver lesions with the best defined over the posterior right lobe measuring 3.1 cm. Possible 1.6 cm hyperdense nodule over the dome of the medial segment left lobe of the liver. Gallbladder and biliary tree are unremarkable. Pancreas: Prominence with minimal associated calcification over the body of the pancreas. Spleen: Normal. Adrenals/Urinary Tract: Adrenal glands are normal. Kidneys are normal in size without hydronephrosis or nephrolithiasis. No focal renal mass. Ureters are normal. Single focus of air over the anterior bladder. Stomach/Bowel: Stomach and small bowel are normal. Appendix is normal. Colon is unremarkable. Vascular/Lymphatic: Minimal calcified plaque over the abdominal aorta which is normal in caliber. Few small nonspecific lymph nodes over the upper abdomen over the gastrohepatic ligament and Peri pancreatic region. Reproductive: Uterus is right of midline.  Ovaries are normal. Other: No free fluid or focal inflammatory change. Musculoskeletal: Mild degenerative changes of the spine and hips. IMPRESSION: 1.  No acute findings in the abdomen/pelvis. 2. Focal prominence with associated calcification over the  body of the pancreas. Few small nonspecific adjacent lymph nodes. Recommend further evaluation  with MRI. 3. A few indistinct hypodensities over the liver with the best defined over the posterior right lobe measuring 3.1 cm. Findings are indeterminate as metastatic disease is possible. This could also be further evaluated with MRI. 4. Two nodules over the right mid to lower lung with the larger over the lateral right lower lobe measuring 8 mm. Recommend chest CT with contrast for complete evaluation of the lungs and thorax. 5.  Aortic Atherosclerosis (ICD10-I70.0). 6. Tiny focus of air over the anterior bladder which may be due to recent instrumentation although can be seen due to infection or enteric fistula. Electronically Signed   By: Marin Olp M.D.   On: 02/21/2020 16:38   CT CHEST WO CONTRAST  Result Date: 02/28/2020 CLINICAL DATA:  Respiratory failure.  Pulmonary nodules EXAM: CT CHEST WITHOUT CONTRAST TECHNIQUE: Multidetector CT imaging of the chest was performed following the standard protocol without IV contrast. COMPARISON:  CT abdomen 02/21/2020 FINDINGS: Cardiovascular: Heart is normal size. Aorta is normal caliber. Aortic atherosclerosis. Mediastinum/Nodes: No mediastinal, hilar, or axillary adenopathy. Lungs/Pleura: 13 mm spiculated nodule in the left upper lobe on image 28. Right lower lobe nodule as seen on prior abdominal CT measures 8 mm on image 64. Small subpleural nodule in the right middle lobe measures 4 mm. Mild elevation of the right hemidiaphragm. Bibasilar atelectasis or scarring. No effusions. Upper Abdomen: Ill-defined hypodensities again noted in the liver, best seen in the posterior right hepatic lobe measuring approximately 3.3 cm, similar to prior abdominal CT. Musculoskeletal: Chest wall soft tissues are unremarkable. No acute bony abnormality. IMPRESSION: Previously seen nodules in the right lower lobe and right middle lobe as seen on prior abdominal CT. There is also a spiculated 13 mm nodule in the left upper lobe. Appearance is concerning for possible primary lung  cancer. Consider further evaluation with PET CT. Ill-defined low-density lesions in the liver as seen on prior abdominal CT. Cannot exclude metastatic disease. Aortic Atherosclerosis (ICD10-I70.0). Electronically Signed   By: Rolm Baptise M.D.   On: 02/28/2020 20:55   DG Chest Port 1 View  Result Date: 02/28/2020 CLINICAL DATA:  Shortness of breath. EXAM: PORTABLE CHEST 1 VIEW COMPARISON:  Chest x-ray dated July 19, 2018. FINDINGS: The heart size and mediastinal contours are within normal limits. Pulmonary vascular congestion with chronically coarse interstitial markings. New 1.5 cm nodule in the left upper lobe. No focal consolidation, pleural effusion, or pneumothorax. No acute osseous abnormality. IMPRESSION: 1. New 1.5 cm nodule in the left upper lobe. Chest CT is recommended for further evaluation. 2. Mild pulmonary vascular congestion. Electronically Signed   By: Titus Dubin M.D.   On: 02/28/2020 15:49   ECHOCARDIOGRAM COMPLETE  Result Date: 02/29/2020    ECHOCARDIOGRAM REPORT   Patient Name:   Kathryn Cook Date of Exam: 02/29/2020 Medical Rec #:  [id number]           Height:       64.0 in Accession #:    [id number]          Weight:       223.2 lb Date of Birth:  19-Dec-1950           BSA:          2.050 m Patient Age:    54 years            BP:  105/74 mmHg Patient Gender: F                   HR:           73 bpm. Exam Location:  [facility] Procedure: 2D Echo Indications:    Congestive Heart Failure 428.0 / I50.9  History:        Patient has prior history of Echocardiogram examinations, most                 recent 06/11/2013. CHF, Stroke; Risk Factors:Hypertension,                 Dyslipidemia, Diabetes and Former Smoker. LVH.  Sonographer:    Leavy Cella RDCS (AE) Referring Phys: [place]  1. Left ventricular ejection fraction, by estimation, is 65 to 70%. The left ventricle has normal function. The left ventricle has no regional wall motion  abnormalities. There is mild left ventricular hypertrophy. Left ventricular diastolic parameters are consistent with Grade I diastolic dysfunction (impaired relaxation).  2. Right ventricular systolic function is low normal. The right ventricular size is mildly enlarged. There is mildly elevated pulmonary artery systolic pressure.  3. The mitral valve is normal in structure. No evidence of mitral valve regurgitation. No evidence of mitral stenosis.  4. The aortic valve is tricuspid. Aortic valve regurgitation is not visualized. No aortic stenosis is present. FINDINGS  Left Ventricle: Left ventricular ejection fraction, by estimation, is 65 to 70%. The left ventricle has normal function. The left ventricle has no regional wall motion abnormalities. The left ventricular internal cavity size was normal in size. There is  mild left ventricular hypertrophy. Left ventricular diastolic parameters are consistent with Grade I diastolic dysfunction (impaired relaxation). Normal left ventricular filling pressure. Right Ventricle: The right ventricular size is mildly enlarged. Right vetricular wall thickness was not assessed. Right ventricular systolic function is low normal. There is mildly elevated pulmonary artery systolic pressure. The tricuspid regurgitant velocity is 2.86 m/s, and with an assumed right atrial pressure of 10 mmHg, the estimated right ventricular systolic pressure is 73.5 mmHg. Left Atrium: Left atrial size was normal in size. Right Atrium: Right atrial size was not well visualized. Pericardium: There is no evidence of pericardial effusion. Mitral Valve: The mitral valve is normal in structure. No evidence of mitral valve regurgitation. No evidence of mitral valve stenosis. Tricuspid Valve: The tricuspid valve is normal in structure. Tricuspid valve regurgitation is mild . No evidence of tricuspid stenosis. Aortic Valve: The aortic valve is tricuspid. Aortic valve regurgitation is not visualized. No aortic  stenosis is present. Aortic valve mean gradient measures 4.0 mmHg. Aortic valve peak gradient measures 7.4 mmHg. Aortic valve area, by VTI measures 2.61 cm. Pulmonic Valve: The pulmonic valve was not well visualized. Pulmonic valve regurgitation is not visualized. No evidence of pulmonic stenosis. Aorta: The aortic root is normal in size and structure. Pulmonary Artery: Indeterminant PASP, IVC poorly visualized. Venous: The inferior vena cava was not well visualized. IAS/Shunts: The interatrial septum was not well visualized.  LEFT VENTRICLE PLAX 2D LVIDd:         4.15 cm  Diastology LVIDs:         1.76 cm  LV e' lateral:   8.38 cm/s LV PW:         1.12 cm  LV E/e' lateral: 4.3 LV IVS:        1.17 cm  LV e' medial:    5.66 cm/s LVOT diam:  2.00 cm  LV E/e' medial:  6.4 LV SV:         55 LV SV Index:   27 LVOT Area:     3.14 cm  RIGHT VENTRICLE RV S prime:     8.05 cm/s TAPSE (M-mode): 1.6 cm LEFT ATRIUM           Index       RIGHT ATRIUM           Index LA diam:      2.10 cm 1.02 cm/m  RA Area:     13.30 cm LA Vol (A2C): 21.8 ml 10.64 ml/m RA Volume:   36.20 ml  17.66 ml/m LA Vol (A4C): 32.1 ml 15.66 ml/m  AORTIC VALVE AV Area (Vmax):    2.37 cm AV Area (Vmean):   2.25 cm AV Area (VTI):     2.61 cm AV Vmax:           135.74 cm/s AV Vmean:          96.225 cm/s AV VTI:            0.210 m AV Peak Grad:      7.4 mmHg AV Mean Grad:      4.0 mmHg LVOT Vmax:         102.35 cm/s LVOT Vmean:        68.803 cm/s LVOT VTI:          0.174 m LVOT/AV VTI ratio: 0.83  AORTA Ao Root diam: 3.00 cm MITRAL VALVE               TRICUSPID VALVE MV Area (PHT): 1.98 cm    TR Peak grad:   32.7 mmHg MV Decel Time: 384 msec    TR Vmax:        286.00 cm/s MV E velocity: 36.40 cm/s MV A velocity: 75.20 cm/s  SHUNTS MV E/A ratio:  0.48        Systemic VTI:  0.17 m                            Systemic Diam: 2.00 cm Carlyle Dolly MD Electronically signed by Carlyle Dolly MD Signature Date/Time: 02/29/2020/10:45:25 AM    Final        CBC Recent Labs  Lab 02/26/20 1039 02/28/20 1522  WBC 11.4* 9.5  HGB 12.9 12.5  HCT 41.3 40.6  PLT 235 243  MCV 85.9 85.5  MCH 26.8* 26.3  MCHC 31.2* 30.8  RDW 14.9 16.5*  LYMPHSABS 1,562 2.0  MONOABS  --  0.9  EOSABS 125 0.1  BASOSABS 80 0.1    Chemistries  Recent Labs  Lab 02/26/20 1039 02/28/20 1522 02/29/20 0551  NA 142 139 141  K 3.9 3.7 4.1  CL 107 110 112*  CO2 21 17* 20*  GLUCOSE 237* 47* 271*  BUN 56* 60* 49*  CREATININE 2.88* 3.60* 2.86*  CALCIUM 9.9 9.3 8.2*  AST 13 15  --   ALT 10 12  --   ALKPHOS  --  84  --   BILITOT 0.5 0.5  --    ------------------------------------------------------------------------------------------------------------------ No results for input(s): CHOL, HDL, LDLCALC, TRIG, CHOLHDL, LDLDIRECT in the last 72 hours.  Lab Results  Component Value Date   HGBA1C 7.4 (A) 01/28/2020   ------------------------------------------------------------------------------------------------------------------ No results for input(s): TSH, T4TOTAL, T3FREE, THYROIDAB in the last 72 hours.  Invalid input(s): FREET3 ------------------------------------------------------------------------------------------------------------------ No results for input(s): VITAMINB12, FOLATE, FERRITIN, TIBC,  IRON, RETICCTPCT in the last 72 hours.  Coagulation profile Recent Labs  Lab 02/28/20 1522  INR 1.3*    No results for input(s): DDIMER in the last 72 hours.  Cardiac Enzymes No results for input(s): CKMB, TROPONINI, MYOGLOBIN in the last 168 hours.  Invalid input(s): CK ------------------------------------------------------------------------------------------------------------------    Component Value Date/Time   BNP 356.0 (H) 02/28/2020 1522   Roxan Hockey M.D on 03/01/2020 at 6:33 PM  Go to [email or web address] - for contact info  [facility] - Office  [phone]

## 2020-03-02 LAB — COMPREHENSIVE METABOLIC PANEL
ALT: 13 U/L (ref 0–44)
AST: 14 U/L — ABNORMAL LOW (ref 15–41)
Albumin: 2.9 g/dL — ABNORMAL LOW (ref 3.5–5.0)
Alkaline Phosphatase: 70 U/L (ref 38–126)
Anion gap: 8 (ref 5–15)
BUN: 30 mg/dL — ABNORMAL HIGH (ref 8–23)
CO2: 19 mmol/L — ABNORMAL LOW (ref 22–32)
Calcium: 8.8 mg/dL — ABNORMAL LOW (ref 8.9–10.3)
Chloride: 117 mmol/L — ABNORMAL HIGH (ref 98–111)
Creatinine, Ser: 1.96 mg/dL — ABNORMAL HIGH (ref 0.44–1.00)
GFR calc Af Amer: 30 mL/min — ABNORMAL LOW (ref >60)
GFR calc non Af Amer: 26 mL/min — ABNORMAL LOW (ref >60)
Glucose, Bld: 185 mg/dL — ABNORMAL HIGH (ref 70–99)
Potassium: 4 mmol/L (ref 3.5–5.1)
Sodium: 144 mmol/L (ref 135–145)
Total Bilirubin: 0.4 mg/dL (ref 0.3–1.2)
Total Protein: 6.4 g/dL — ABNORMAL LOW (ref 6.5–8.1)

## 2020-03-02 LAB — CBC
HCT: 37.3 % (ref 36.0–46.0)
Hemoglobin: 11.4 g/dL — ABNORMAL LOW (ref 12.0–15.0)
MCH: 26.4 pg (ref 26.0–34.0)
MCHC: 30.6 g/dL (ref 30.0–36.0)
MCV: 86.3 fL (ref 80.0–100.0)
Platelets: 181 10*3/uL (ref 150–400)
RBC: 4.32 MIL/uL (ref 3.87–5.11)
RDW: 16.7 % — ABNORMAL HIGH (ref 11.5–15.5)
WBC: 10.8 10*3/uL — ABNORMAL HIGH (ref 4.0–10.5)
nRBC: 0 % (ref 0.0–0.2)

## 2020-03-02 LAB — GLUCOSE, CAPILLARY
Glucose-Capillary: 198 mg/dL — ABNORMAL HIGH (ref 70–99)
Glucose-Capillary: 170 mg/dL — ABNORMAL HIGH (ref 70–99)
Glucose-Capillary: 256 mg/dL — ABNORMAL HIGH (ref 70–99)
Glucose-Capillary: 140 mg/dL — ABNORMAL HIGH (ref 70–99)

## 2020-03-02 NOTE — Progress Notes (Signed)
Patient Demographics:    Kathryn Cook, is a 69 y.o. female, DOB - [birth year], WER:[id number]  Admit date - 02/28/2020   Admitting Physician Ejiroghene Arlyce Dice, MD  Outpatient Primary MD for the patient is [name], MD  LOS - 3   Chief Complaint  Patient presents with  . Urinary Tract Infection        Subjective:    Kathryn Cook today has no fevers, no emesis,  No chest pain,   -We will get GI input regarding her persistent diarrhea -Appetite is not great at all  Assessment  & Plan :    Principal Problem:   AKI (acute kidney injury) ([place]) Active Problems:   HTN (hypertension)   Kidney disease, chronic, stage III (GFR 30-59 ml/min)   Diastolic congestive heart failure, NYHA class 1 (HCC)   H/O: CVA (cerebrovascular accident)   Dementia ([place])   Type II diabetes mellitus with neurological manifestations ([place])   Essential hypertension, benign   Diarrhea  Brief Summary:- 69 y.o. female with medical history significant for diastolic CHF, CKD, diabetes mellitus, dementia, CVA, hypertension admitted on 02/28/20 with AKI in the setting of diarrhea   A/p 1)AKI----acute kidney injury on CKD stage - IIIB   creatinine on admission= 3.6, baseline creatinine = around 2    , creatinine is now= 1.96, renally adjust medications, avoid nephrotoxic agents / dehydration  / hypotension  -Worsening renal function related to dehydration in the setting of diarrhea and GI losses  2)HFpEF--Echo with EF of 65 to 70% and grade 1 diastolic dysfunction--- be judicious with IV fluids  3) chronic diarrhea--37-month duration , extensive outpatient GI work-up, C. difficile and GI pathogen negative as outpatient Continue as needed Imodium and Rx with Creon with meals -Patient is to follow-up as outpatient with GI for ongoing work-up -Get GI input (reconsult  GI) while here prior to DC back to group home    4)Citrobacter koseri UTI-- continue IV Rocephin, reluctant to switch to Augmentin due to increased risk for diarrhea  5)Acute hypoxic respiratory failure----CT chest without contrast suggest possible primary lung cancer with a spiculated nodule in the left upper lobe -Outpatient PET scans recommended  6)H/o Vascular dementia and prior CVA-aspirin, statin, Aricept and Celexa  7)Recent abnormal CT abdomen/pelvis findings- 02/21/20-  A few indistinct hypodensities over the liver with the best defined over the posterior right lobe measuring 3.1 cm. Findings are indeterminate as metastatic disease is possible. This could also be further evaluated with MRI. -Being followed by gastroenterology, follow-up as outpatient. -MRI not available at [facility] until  Tuesday 03/04/20 --Get GI input (reconsult  GI) while here prior to DC back to group home   8)HTN--- hypotension resolved with IV fluids, Norvasc Coreg Lasix and losartan currently on hold  9)DM2-A1c 7.4 reflecting uncontrolled DM PTA -Hold   Tradjenta -Restart Lantus at 20 units nightly, patient was on 50 units PTA Use Novolog/Humalog Sliding scale insulin with Accu-Cheks/Fingersticks as ordered    Disposition/Need for in-Hospital Stay- patient unable to be discharged at this time due to --acute kidney injury and dehydration requiring continuous IV fluids and close monitoring due to risk for volume overload and CHF exacerbation  Status is: Inpatient  Remains inpatient appropriate because:Altered mental status, IV treatments  appropriate due to intensity of illness or inability to take PO and Inpatient level of care appropriate due to severity of illness   Dispo: The patient is from: Home              Anticipated d/c is to: Group home              Anticipated d/c date is: 1              Patient currently is not medically stable to d/c.  -acute kidney injury and dehydration requiring continuous IV fluids and close monitoring due to risk  for volume overload and CHF exacerbation Barriers: Not Clinically Stable-awaiting further improvement in renal function, also awaiting GI input on further management of her persistent diarrhea  Code Status : Full  Family Communication:   From group home-- -  administrator from group home notified  that MRI currently not available here Consults  : Gi  DVT Prophylaxis  :   - Heparin - SCDs   Lab Results  Component Value Date   PLT 181 03/02/2020    Inpatient Medications  Scheduled Meds: . aspirin EC  325 mg Oral Daily  . citalopram  40 mg Oral Daily  . donepezil  10 mg Oral QHS  . heparin  5,000 Units Subcutaneous Q8H  . insulin aspart  0-5 Units Subcutaneous QHS  . insulin aspart  0-9 Units Subcutaneous TID WC  . insulin glargine  20 Units Subcutaneous QHS  . lipase/protease/amylase  24,000 Units Oral TID WC  . umeclidinium bromide  1 puff Inhalation Daily   Continuous Infusions: . sodium chloride 50 mL/hr at 03/02/20 1051  . cefTRIAXone (ROCEPHIN)  IV 2 g (03/02/20 1646)   PRN Meds:.acetaminophen **OR** acetaminophen, ipratropium-albuterol, loperamide, ondansetron **OR** ondansetron (ZOFRAN) IV, polyethylene glycol    Anti-infectives (From admission, onward)   Start     Dose/Rate Route Frequency Ordered Stop   02/29/20 1800  cefTRIAXone (ROCEPHIN) 2 g in sodium chloride 0.9 % 100 mL IVPB     2 g 200 mL/hr over 30 Minutes Intravenous Every 24 hours 02/28/20 2005     02/28/20 1915  cefTRIAXone (ROCEPHIN) 1 g in sodium chloride 0.9 % 100 mL IVPB    Note to Pharmacy: Dose adjusted for weight.   1 g 200 mL/hr over 30 Minutes Intravenous  Once 02/28/20 1904 02/28/20 2130   02/28/20 1600  cefTRIAXone (ROCEPHIN) 1 g in sodium chloride 0.9 % 100 mL IVPB     1 g 200 mL/hr over 30 Minutes Intravenous  Once 02/28/20 1511 02/28/20 1627        Objective:   Vitals:   03/01/20 1314 03/01/20 2106 03/02/20 0302 03/02/20 0831  BP: 124/86 132/81 130/82   Pulse: 80 75 74   Resp:  18 17 (!) 22   Temp: 98 F (36.7 C) (!) 100.8 F (38.2 C) 98.9 F (37.2 C)   TempSrc: Oral Oral Oral   SpO2: 95% 92% (!) 89% 96%  Weight:      Height:        Wt Readings from Last 3 Encounters:  03/01/20 101.9 kg  02/04/20 104.3 kg  01/28/20 106.4 kg     Intake/Output Summary (Last 24 hours) at 03/02/2020 1835 Last data filed at 03/02/2020 1700 Gross per 24 hour  Intake 847.54 ml  Output 750 ml  Net 97.54 ml    Physical Exam Gen:-More awake and interactive, no acute distress HEENT:- [place].AT, No sclera icterus Neck-Supple Neck,No JVD,.  Lungs-  CTAB , fair symmetrical air movement CV- S1, S2 normal, regular  Abd-  +ve B.Sounds, Abd Soft, No tenderness, no CVA area tenderness Extremity/Skin:- No  edema, pedal pulses present  Psych-affect is appropriate, oriented x3 Neuro-generalized weakness, no new focal deficits, no tremors   Data Review:   Micro Results Recent Results (from the past 240 hour(s))  Urine Culture     Status: Abnormal   Collection Time: 02/26/20 10:39 AM   Specimen: Urine  Result Value Ref Range Status   MICRO NUMBER: [id number]  Final   SPECIMEN QUALITY: Adequate  Final   Sample Source URINE  Final   STATUS: FINAL  Final   ISOLATE 1: Citrobacter koseri (A)  Final    Comment: Greater than 100,000 CFU/mL of Citrobacter koseri      Susceptibility   Citrobacter koseri - URINE CULTURE, REFLEX    AMOX/CLAVULANIC <=2 Sensitive     CEFAZOLIN* <=4 Not Reportable      * For infections other than uncomplicated UTIcaused by E. coli, K. pneumoniae or P. mirabilis:Cefazolin is resistant if MIC > or = 8 mcg/mL.(Distinguishing susceptible versus intermediatefor isolates with MIC < or = 4 mcg/mL requiresadditional testing.)For uncomplicated UTI caused by E. coli,K. pneumoniae or P. mirabilis: Cefazolin issusceptible if MIC <32 mcg/mL and predictssusceptible to the oral agents cefaclor, cefdinir,cefpodoxime, cefprozil, cefuroxime, cephalexinand loracarbef.    CEFEPIME <=1  Sensitive     CEFTRIAXONE <=1 Sensitive     CIPROFLOXACIN <=0.25 Sensitive     LEVOFLOXACIN <=0.12 Sensitive     ERTAPENEM <=0.5 Sensitive     GENTAMICIN <=1 Sensitive     IMIPENEM <=0.25 Sensitive     NITROFURANTOIN 32 Sensitive     PIP/TAZO <=4 Sensitive     TOBRAMYCIN <=1 Sensitive     TRIMETH/SULFA* <=20 Sensitive      * For infections other than uncomplicated UTIcaused by E. coli, K. pneumoniae or P. mirabilis:Cefazolin is resistant if MIC > or = 8 mcg/mL.(Distinguishing susceptible versus intermediatefor isolates with MIC < or = 4 mcg/mL requiresadditional testing.)For uncomplicated UTI caused by E. coli,K. pneumoniae or P. mirabilis: Cefazolin issusceptible if MIC <32 mcg/mL and predictssusceptible to the oral agents cefaclor, cefdinir,cefpodoxime, cefprozil, cefuroxime, cephalexinand loracarbef.Legend:S = Susceptible  I = IntermediateR = Resistant  NS = Not susceptible* = Not tested  NR = Not reported**NN = See antimicrobic comments  Blood Culture (routine x 2)     Status: None (Preliminary result)   Collection Time: 02/28/20  3:23 PM   Specimen: Right Antecubital; Blood  Result Value Ref Range Status   Specimen Description RIGHT ANTECUBITAL  Final   Special Requests   Final    BOTTLES DRAWN AEROBIC AND ANAEROBIC Blood Culture adequate volume   Culture   Final    NO GROWTH 3 DAYS Performed at [facility], [address] [place], [place] [address]    Report Status PENDING  Incomplete  Blood Culture (routine x 2)     Status: None (Preliminary result)   Collection Time: 02/28/20  3:29 PM   Specimen: Left Antecubital; Blood  Result Value Ref Range Status   Specimen Description LEFT ANTECUBITAL  Final   Special Requests   Final    BOTTLES DRAWN AEROBIC AND ANAEROBIC Blood Culture adequate volume   Culture   Final    NO GROWTH 3 DAYS Performed at [facility], [address], [place], [place] [address]    Report Status PENDING  Incomplete  SARS Coronavirus 2 by RT PCR (hospital  order, performed in [facility] hospital lab) Nasopharyngeal Nasopharyngeal Swab     Status: None   Collection Time: 02/28/20  4:08 PM   Specimen: Nasopharyngeal Swab  Result Value Ref Range Status   SARS Coronavirus 2 NEGATIVE NEGATIVE Final    Comment: (NOTE) SARS-CoV-2 target nucleic acids are NOT DETECTED. The SARS-CoV-2 RNA is generally detectable in upper and lower respiratory specimens during the acute phase of infection. The lowest concentration of SARS-CoV-2 viral copies this assay can detect is 250 copies / mL. A negative result does not preclude SARS-CoV-2 infection and should not be used as the sole basis for treatment or other patient management decisions.  A negative result may occur with improper specimen collection / handling, submission of specimen other than nasopharyngeal swab, presence of viral mutation(s) within the areas targeted by this assay, and inadequate number of viral copies (<250 copies / mL). A negative result must be combined with clinical observations, patient history, and epidemiological information. Fact Sheet for Patients:   StrictlyIdeas.no Fact Sheet for Healthcare Providers: BankingDealers.co.za This test is not yet approved or cleared  by the [place] FDA and has been authorized for detection and/or diagnosis of SARS-CoV-2 by FDA under an Emergency Use Authorization (EUA).  This EUA will remain in effect (meaning this test can be used) for the duration of the COVID-19 declaration under Section 564(b)(1) of the Act, 21 U.S.C. section 360bbb-3(b)(1), unless the authorization is terminated or revoked sooner. Performed at [facility], [address], [place], [place] [address]   Urine culture     Status: Abnormal   Collection Time: 02/28/20  5:40 PM   Specimen: In/Out Cath Urine  Result Value Ref Range Status   Specimen Description   Final    IN/OUT CATH URINE Performed at [facility],  [address] [place], [place] [address]    Special Requests   Final    NONE Performed at [facility], [address] [place], [place] [address]    Culture 20,000 COLONIES/mL ENTEROCOCCUS FAECALIS (A)  Final   Report Status 03/01/2020 FINAL  Final   Organism ID, Bacteria ENTEROCOCCUS FAECALIS (A)  Final      Susceptibility   Enterococcus faecalis - MIC*    AMPICILLIN <=2 SENSITIVE Sensitive     NITROFURANTOIN 64 INTERMEDIATE Intermediate     VANCOMYCIN 2 SENSITIVE Sensitive     * 20,000 COLONIES/mL ENTEROCOCCUS FAECALIS    Radiology Reports CT ABDOMEN PELVIS WO CONTRAST  Result Date: 02/21/2020 CLINICAL DATA:  Diarrhea with weight loss.  Diabetes. EXAM: CT ABDOMEN AND PELVIS WITHOUT CONTRAST TECHNIQUE: Multidetector CT imaging of the abdomen and pelvis was performed following the standard protocol without IV contrast. COMPARISON:  None. FINDINGS: Lower chest: Lung bases demonstrate linear scarring over the left lower lobe. There is a 5 x 11 mm nodule over the right lower lobe as well as a 4 mm subpleural nodule over the right middle lobe. Hepatobiliary: Suggestion of several indistinct hypodense liver lesions with the best defined over the posterior right lobe measuring 3.1 cm. Possible 1.6 cm hyperdense nodule over the dome of the medial segment left lobe of the liver. Gallbladder and biliary tree are unremarkable. Pancreas: Prominence with minimal associated calcification over the body of the pancreas. Spleen: Normal. Adrenals/Urinary Tract: Adrenal glands are normal. Kidneys are normal in size without hydronephrosis or nephrolithiasis. No focal renal mass. Ureters are normal. Single focus of air over the anterior bladder. Stomach/Bowel: Stomach and small bowel are normal. Appendix is normal. Colon  is unremarkable. Vascular/Lymphatic: Minimal calcified plaque over the abdominal aorta which is normal in caliber. Few small nonspecific lymph nodes over the upper abdomen over the gastrohepatic ligament  and Peri pancreatic region. Reproductive: Uterus is right of midline.  Ovaries are normal. Other: No free fluid or focal inflammatory change. Musculoskeletal: Mild degenerative changes of the spine and hips. IMPRESSION: 1.  No acute findings in the abdomen/pelvis. 2. Focal prominence with associated calcification over the body of the pancreas. Few small nonspecific adjacent lymph nodes. Recommend further evaluation with MRI. 3. A few indistinct hypodensities over the liver with the best defined over the posterior right lobe measuring 3.1 cm. Findings are indeterminate as metastatic disease is possible. This could also be further evaluated with MRI. 4. Two nodules over the right mid to lower lung with the larger over the lateral right lower lobe measuring 8 mm. Recommend chest CT with contrast for complete evaluation of the lungs and thorax. 5.  Aortic Atherosclerosis (ICD10-I70.0). 6. Tiny focus of air over the anterior bladder which may be due to recent instrumentation although can be seen due to infection or enteric fistula. Electronically Signed   By: Marin Olp M.D.   On: 02/21/2020 16:38   CT CHEST WO CONTRAST  Result Date: 02/28/2020 CLINICAL DATA:  Respiratory failure.  Pulmonary nodules EXAM: CT CHEST WITHOUT CONTRAST TECHNIQUE: Multidetector CT imaging of the chest was performed following the standard protocol without IV contrast. COMPARISON:  CT abdomen 02/21/2020 FINDINGS: Cardiovascular: Heart is normal size. Aorta is normal caliber. Aortic atherosclerosis. Mediastinum/Nodes: No mediastinal, hilar, or axillary adenopathy. Lungs/Pleura: 13 mm spiculated nodule in the left upper lobe on image 28. Right lower lobe nodule as seen on prior abdominal CT measures 8 mm on image 64. Small subpleural nodule in the right middle lobe measures 4 mm. Mild elevation of the right hemidiaphragm. Bibasilar atelectasis or scarring. No effusions. Upper Abdomen: Ill-defined hypodensities again noted in the liver, best  seen in the posterior right hepatic lobe measuring approximately 3.3 cm, similar to prior abdominal CT. Musculoskeletal: Chest wall soft tissues are unremarkable. No acute bony abnormality. IMPRESSION: Previously seen nodules in the right lower lobe and right middle lobe as seen on prior abdominal CT. There is also a spiculated 13 mm nodule in the left upper lobe. Appearance is concerning for possible primary lung cancer. Consider further evaluation with PET CT. Ill-defined low-density lesions in the liver as seen on prior abdominal CT. Cannot exclude metastatic disease. Aortic Atherosclerosis (ICD10-I70.0). Electronically Signed   By: Rolm Baptise M.D.   On: 02/28/2020 20:55   DG Chest Port 1 View  Result Date: 02/28/2020 CLINICAL DATA:  Shortness of breath. EXAM: PORTABLE CHEST 1 VIEW COMPARISON:  Chest x-ray dated July 19, 2018. FINDINGS: The heart size and mediastinal contours are within normal limits. Pulmonary vascular congestion with chronically coarse interstitial markings. New 1.5 cm nodule in the left upper lobe. No focal consolidation, pleural effusion, or pneumothorax. No acute osseous abnormality. IMPRESSION: 1. New 1.5 cm nodule in the left upper lobe. Chest CT is recommended for further evaluation. 2. Mild pulmonary vascular congestion. Electronically Signed   By: Titus Dubin M.D.   On: 02/28/2020 15:49   ECHOCARDIOGRAM COMPLETE  Result Date: 02/29/2020    ECHOCARDIOGRAM REPORT   Patient Name:   ALISAH GRANDBERRY Date of Exam: 02/29/2020 Medical Rec #:  [id number]           Height:       64.0 in Accession #:  [id number]          Weight:       223.2 lb Date of Birth:  08-23-1951           BSA:          2.050 m Patient Age:    69 years            BP:           105/74 mmHg Patient Gender: F                   HR:           73 bpm. Exam Location:  [facility] Procedure: 2D Echo Indications:    Congestive Heart Failure 428.0 / I50.9  History:        Patient has prior history of Echocardiogram  examinations, most                 recent 06/11/2013. CHF, Stroke; Risk Factors:Hypertension,                 Dyslipidemia, Diabetes and Former Smoker. LVH.  Sonographer:    Leavy Cella RDCS (AE) Referring Phys: [place]  1. Left ventricular ejection fraction, by estimation, is 65 to 70%. The left ventricle has normal function. The left ventricle has no regional wall motion abnormalities. There is mild left ventricular hypertrophy. Left ventricular diastolic parameters are consistent with Grade I diastolic dysfunction (impaired relaxation).  2. Right ventricular systolic function is low normal. The right ventricular size is mildly enlarged. There is mildly elevated pulmonary artery systolic pressure.  3. The mitral valve is normal in structure. No evidence of mitral valve regurgitation. No evidence of mitral stenosis.  4. The aortic valve is tricuspid. Aortic valve regurgitation is not visualized. No aortic stenosis is present. FINDINGS  Left Ventricle: Left ventricular ejection fraction, by estimation, is 65 to 70%. The left ventricle has normal function. The left ventricle has no regional wall motion abnormalities. The left ventricular internal cavity size was normal in size. There is  mild left ventricular hypertrophy. Left ventricular diastolic parameters are consistent with Grade I diastolic dysfunction (impaired relaxation). Normal left ventricular filling pressure. Right Ventricle: The right ventricular size is mildly enlarged. Right vetricular wall thickness was not assessed. Right ventricular systolic function is low normal. There is mildly elevated pulmonary artery systolic pressure. The tricuspid regurgitant velocity is 2.86 m/s, and with an assumed right atrial pressure of 10 mmHg, the estimated right ventricular systolic pressure is 17.5 mmHg. Left Atrium: Left atrial size was normal in size. Right Atrium: Right atrial size was not well visualized. Pericardium: There is  no evidence of pericardial effusion. Mitral Valve: The mitral valve is normal in structure. No evidence of mitral valve regurgitation. No evidence of mitral valve stenosis. Tricuspid Valve: The tricuspid valve is normal in structure. Tricuspid valve regurgitation is mild . No evidence of tricuspid stenosis. Aortic Valve: The aortic valve is tricuspid. Aortic valve regurgitation is not visualized. No aortic stenosis is present. Aortic valve mean gradient measures 4.0 mmHg. Aortic valve peak gradient measures 7.4 mmHg. Aortic valve area, by VTI measures 2.61 cm. Pulmonic Valve: The pulmonic valve was not well visualized. Pulmonic valve regurgitation is not visualized. No evidence of pulmonic stenosis. Aorta: The aortic root is normal in size and structure. Pulmonary Artery: Indeterminant PASP, IVC poorly visualized. Venous: The inferior vena cava was not well visualized. IAS/Shunts: The interatrial septum was not well visualized.  LEFT  VENTRICLE PLAX 2D LVIDd:         4.15 cm  Diastology LVIDs:         1.76 cm  LV e' lateral:   8.38 cm/s LV PW:         1.12 cm  LV E/e' lateral: 4.3 LV IVS:        1.17 cm  LV e' medial:    5.66 cm/s LVOT diam:     2.00 cm  LV E/e' medial:  6.4 LV SV:         55 LV SV Index:   27 LVOT Area:     3.14 cm  RIGHT VENTRICLE RV S prime:     8.05 cm/s TAPSE (M-mode): 1.6 cm LEFT ATRIUM           Index       RIGHT ATRIUM           Index LA diam:      2.10 cm 1.02 cm/m  RA Area:     13.30 cm LA Vol (A2C): 21.8 ml 10.64 ml/m RA Volume:   36.20 ml  17.66 ml/m LA Vol (A4C): 32.1 ml 15.66 ml/m  AORTIC VALVE AV Area (Vmax):    2.37 cm AV Area (Vmean):   2.25 cm AV Area (VTI):     2.61 cm AV Vmax:           135.74 cm/s AV Vmean:          96.225 cm/s AV VTI:            0.210 m AV Peak Grad:      7.4 mmHg AV Mean Grad:      4.0 mmHg LVOT Vmax:         102.35 cm/s LVOT Vmean:        68.803 cm/s LVOT VTI:          0.174 m LVOT/AV VTI ratio: 0.83  AORTA Ao Root diam: 3.00 cm MITRAL VALVE                TRICUSPID VALVE MV Area (PHT): 1.98 cm    TR Peak grad:   32.7 mmHg MV Decel Time: 384 msec    TR Vmax:        286.00 cm/s MV E velocity: 36.40 cm/s MV A velocity: 75.20 cm/s  SHUNTS MV E/A ratio:  0.48        Systemic VTI:  0.17 m                            Systemic Diam: 2.00 cm Carlyle Dolly MD Electronically signed by Carlyle Dolly MD Signature Date/Time: 02/29/2020/10:45:25 AM    Final      CBC Recent Labs  Lab 02/26/20 1039 02/28/20 1522 03/02/20 0405  WBC 11.4* 9.5 10.8*  HGB 12.9 12.5 11.4*  HCT 41.3 40.6 37.3  PLT 235 243 181  MCV 85.9 85.5 86.3  MCH 26.8* 26.3 26.4  MCHC 31.2* 30.8 30.6  RDW 14.9 16.5* 16.7*  LYMPHSABS 1,562 2.0  --   MONOABS  --  0.9  --   EOSABS 125 0.1  --   BASOSABS 80 0.1  --     Chemistries  Recent Labs  Lab 02/26/20 1039 02/28/20 1522 02/29/20 0551 03/02/20 0405  NA 142 139 141 144  K 3.9 3.7 4.1 4.0  CL 107 110 112* 117*  CO2 21 17* 20* 19*  GLUCOSE 237* 47* 271*  185*  BUN 56* 60* 49* 30*  CREATININE 2.88* 3.60* 2.86* 1.96*  CALCIUM 9.9 9.3 8.2* 8.8*  AST 13 15  --  14*  ALT 10 12  --  13  ALKPHOS  --  84  --  70  BILITOT 0.5 0.5  --  0.4   ------------------------------------------------------------------------------------------------------------------ No results for input(s): CHOL, HDL, LDLCALC, TRIG, CHOLHDL, LDLDIRECT in the last 72 hours.  Lab Results  Component Value Date   HGBA1C 7.4 (A) 01/28/2020   ------------------------------------------------------------------------------------------------------------------ No results for input(s): TSH, T4TOTAL, T3FREE, THYROIDAB in the last 72 hours.  Invalid input(s): FREET3 ------------------------------------------------------------------------------------------------------------------ No results for input(s): VITAMINB12, FOLATE, FERRITIN, TIBC, IRON, RETICCTPCT in the last 72 hours.  Coagulation profile Recent Labs  Lab 02/28/20 1522  INR 1.3*    No results  for input(s): DDIMER in the last 72 hours.  Cardiac Enzymes No results for input(s): CKMB, TROPONINI, MYOGLOBIN in the last 168 hours.  Invalid input(s): CK ------------------------------------------------------------------------------------------------------------------    Component Value Date/Time   BNP 356.0 (H) 02/28/2020 1522   Roxan Hockey M.D on 03/02/2020 at 6:35 PM  Go to [email or web address] - for contact info  [facility] - Office  [phone]

## 2020-03-02 NOTE — TOC Initial Note (Signed)
Transition of Care [facility]) - Initial/Assessment Note    Patient Details  Name: Kathryn Cook MRN: [id number] Date of Birth: 09/09/1951  [facility] CM/SW Contact:    Shade Flood, LCSW Phone Number: 03/02/2020, 2:42 PM  Clinical Narrative:                  Pt admitted from [facility]. Reviewed pt's record today and spoke with administrator at the [facility] to assess pt's status prior to admission. At baseline, pt does not use any DME and she receives minimal assistance with ADLs. Unless PT recommends SNF, pt is able to return to the [facility] at dc and they will transport her. Administrator asking if pt can have her MRI that was scheduled for outpatient on Tuesday this week while she is in the hospital. Will refer this question to MD for follow up.  TOC will follow and continue to assist with dc planning needs.  Expected Discharge Plan: ([place]) Barriers to Discharge: Continued Medical Work up   Patient Goals and CMS Choice        Expected Discharge Plan and Services Expected Discharge Plan: ([place]) In-house Referral: Clinical Social Work   Post Acute Care Choice: Resumption of Svcs/PTA Provider Living arrangements for the past 2 months: (Family Care home)                                      Prior Living Arrangements/Services Living arrangements for the past 2 months: (Family Care home) Lives with:: Facility Resident Patient language and need for interpreter reviewed:: Yes Do you feel safe going back to the place where you live?: Yes      Need for Family Participation in Patient Care: No (Comment) Care giver support system in place?: Yes (comment)   Criminal Activity/Legal Involvement Pertinent to Current Situation/Hospitalization: No - Comment as needed  Activities of Daily Living Home Assistive Devices/Equipment: None ADL Screening (condition at time of admission) Patient's cognitive ability adequate to safely  complete daily activities?: Yes Is the patient deaf or have difficulty hearing?: No Does the patient have difficulty seeing, even when wearing glasses/contacts?: No Does the patient have difficulty concentrating, remembering, or making decisions?: Yes Patient able to express need for assistance with ADLs?: Yes Does the patient have difficulty dressing or bathing?: No Independently performs ADLs?: Yes (appropriate for developmental age) Does the patient have difficulty walking or climbing stairs?: Yes Weakness of Legs: Both Weakness of Arms/Hands: None  Permission Sought/Granted                  Emotional Assessment       Orientation: : Oriented to Self, Oriented to Place Alcohol / Substance Use: Not Applicable Psych Involvement: No (comment)  Admission diagnosis:  AKI (acute kidney injury) ([place]) [N17.9] Patient Active Problem List   Diagnosis Date Noted  . AKI (acute kidney injury) ([place]) 02/28/2020  . Diarrhea 02/08/2020  . Mixed hyperlipidemia 05/17/2019  . Essential hypertension, benign 05/17/2019  . Healthcare maintenance 03/02/2016  . Type II diabetes mellitus with neurological manifestations ([place]) 06/12/2015  . Moderate dementia without behavioral disturbance ([place]) 12/06/2013  . Dementia ([place]) 10/18/2013  . Tobacco abuse 10/18/2013  . HTN (hypertension), malignant 10/18/2013  . Uncontrolled type 2 diabetes mellitus with stage 4 chronic kidney disease ([place]) 10/01/2013  . Cough 08/27/2013  . Tobacco use disorder 08/27/2013  . H/O:  CVA (cerebrovascular accident) 08/27/2013  . Vascular dementia ([place]) 08/27/2013  . Diastolic congestive heart failure, NYHA class 1 ([place]) 06/13/2013  . LVH (left ventricular hypertrophy) due to hypertensive disease 06/13/2013  . CVA (cerebral infarction) 06/10/2013  . History of alcohol use 05/31/2013  . HTN (hypertension) 05/31/2013  . Memory loss 05/31/2013  . Kidney disease, chronic, stage III (GFR 30-59 ml/min) 05/31/2013   PCP:   [name], MD Pharmacy:   [facility], [place] [address] Phone: [phone] Fax: [phone]     Social Determinants of Health (SDOH) Interventions    Readmission Risk Interventions No flowsheet data found.

## 2020-03-03 ENCOUNTER — Encounter (HOSPITAL_COMMUNITY): Payer: Self-pay | Admitting: Internal Medicine

## 2020-03-03 LAB — COMPREHENSIVE METABOLIC PANEL
ALT: 12 U/L (ref 0–44)
AST: 15 U/L (ref 15–41)
Albumin: 2.8 g/dL — ABNORMAL LOW (ref 3.5–5.0)
Alkaline Phosphatase: 66 U/L (ref 38–126)
Anion gap: 8 (ref 5–15)
BUN: 26 mg/dL — ABNORMAL HIGH (ref 8–23)
CO2: 20 mmol/L — ABNORMAL LOW (ref 22–32)
Calcium: 9 mg/dL (ref 8.9–10.3)
Chloride: 117 mmol/L — ABNORMAL HIGH (ref 98–111)
Creatinine, Ser: 1.75 mg/dL — ABNORMAL HIGH (ref 0.44–1.00)
GFR calc Af Amer: 34 mL/min — ABNORMAL LOW (ref >60)
GFR calc non Af Amer: 29 mL/min — ABNORMAL LOW (ref >60)
Glucose, Bld: 181 mg/dL — ABNORMAL HIGH (ref 70–99)
Potassium: 4.1 mmol/L (ref 3.5–5.1)
Sodium: 145 mmol/L (ref 135–145)
Total Bilirubin: 0.2 mg/dL — ABNORMAL LOW (ref 0.3–1.2)
Total Protein: 6.5 g/dL (ref 6.5–8.1)

## 2020-03-03 LAB — CBC
HCT: 37.8 % (ref 36.0–46.0)
Hemoglobin: 11.7 g/dL — ABNORMAL LOW (ref 12.0–15.0)
MCH: 27 pg (ref 26.0–34.0)
MCHC: 31 g/dL (ref 30.0–36.0)
MCV: 87.1 fL (ref 80.0–100.0)
Platelets: 180 10*3/uL (ref 150–400)
RBC: 4.34 MIL/uL (ref 3.87–5.11)
RDW: 16.8 % — ABNORMAL HIGH (ref 11.5–15.5)
WBC: 10.3 10*3/uL (ref 4.0–10.5)
nRBC: 0 % (ref 0.0–0.2)

## 2020-03-03 LAB — GLUCOSE, CAPILLARY
Glucose-Capillary: 157 mg/dL — ABNORMAL HIGH (ref 70–99)
Glucose-Capillary: 151 mg/dL — ABNORMAL HIGH (ref 70–99)
Glucose-Capillary: 123 mg/dL — ABNORMAL HIGH (ref 70–99)
Glucose-Capillary: 67 mg/dL — ABNORMAL LOW (ref 70–99)
Glucose-Capillary: 96 mg/dL (ref 70–99)

## 2020-03-03 MED ORDER — SACCHAROMYCES BOULARDII 250 MG PO CAPS
250.0000 mg | ORAL_CAPSULE | Freq: Two times a day (BID) | ORAL | Status: DC
  Administered 2020-03-03 – 2020-03-07 (×8): 250 mg via ORAL
  Filled 2020-03-03 (×8): qty 1

## 2020-03-03 MED ORDER — PANCRELIPASE (LIP-PROT-AMYL) 12000-38000 UNITS PO CPEP
36000.0000 [IU] | ORAL_CAPSULE | ORAL | Status: DC
  Administered 2020-03-05 – 2020-03-06 (×3): 36000 [IU] via ORAL
  Filled 2020-03-03 (×4): qty 3

## 2020-03-03 MED ORDER — PANCRELIPASE (LIP-PROT-AMYL) 12000-38000 UNITS PO CPEP
72000.0000 [IU] | ORAL_CAPSULE | Freq: Three times a day (TID) | ORAL | Status: DC
  Administered 2020-03-03 – 2020-03-06 (×8): 72000 [IU] via ORAL
  Filled 2020-03-03 (×9): qty 6

## 2020-03-03 NOTE — Telephone Encounter (Signed)
Doesn't look like she will be getting discharged today. Planning on MRI tomorrow.

## 2020-03-03 NOTE — Telephone Encounter (Signed)
[place], received a VM from [place]. She would like to know if an MRI could be done today instead of tomorrow. She is afraid that if pt goes home today, she won't be able to get pt back up there tomorrow.

## 2020-03-03 NOTE — Telephone Encounter (Signed)
Spoke with Kathryn Cook and she is aware that pt is still in the hospital and scheduled for MRI tomorrow.

## 2020-03-03 NOTE — Progress Notes (Signed)
CBG 67, snack and juice was given and on call, M. Norin was notified. States ok for snack, still administer lantus 20 units and re-check CBG at 2am. CBG 96 after snack, administered lantus as ordered. Will continue to monitor.

## 2020-03-03 NOTE — Care Management Important Message (Signed)
Important Message  Patient Details  Name: Kathryn Cook MRN: [id number] Date of Birth: [birth year]   Medicare Important Message Given:  Yes     Tommy Medal 03/03/2020, 2:57 PM

## 2020-03-03 NOTE — Progress Notes (Signed)
Patient Demographics:    Kathryn Cook, is a 69 y.o. female, DOB - 1951-06-02, KGY:[id number]  Admit date - 02/28/2020   Admitting Physician Ejiroghene Arlyce Dice, MD  Outpatient Primary MD for the patient is [name], MD  LOS - 4   Chief Complaint  Patient presents with  . Urinary Tract Infection        Subjective:    Kathryn Cook today has no fevers, no emesis,  No chest pain,   -Unfortunately diarrhea persist -Appetite remains poor  Assessment  & Plan :    Principal Problem:   AKI (acute kidney injury) ([place]) Active Problems:   HTN (hypertension)   Kidney disease, chronic, stage III (GFR 30-59 ml/min)   Diastolic congestive heart failure, NYHA class 1 (HCC)   H/O: CVA (cerebrovascular accident)   Dementia ([place])   Type II diabetes mellitus with neurological manifestations ([place])   Essential hypertension, benign   Diarrhea  Brief Summary:- 69 y.o. female with medical history significant for diastolic CHF, CKD, diabetes mellitus, dementia, CVA, hypertension admitted on 02/28/20 with AKI in the setting of diarrhea   A/p 1)AKI----acute kidney injury on CKD stage - IIIB   creatinine on admission= 3.6, baseline creatinine = around 2    , creatinine is now= 1.75, renally adjust medications, avoid nephrotoxic agents / dehydration  / hypotension  -Worsening renal function related to dehydration in the setting of persistent diarrhea and GI losses -Renal function improving with hydration  2)HFpEF--Echo with EF of 65 to 70% and grade 1 diastolic dysfunction--- be judicious with IV fluids  3) chronic diarrhea--62-month duration , extensive outpatient GI work-up, C. difficile and GI pathogen negative and normal celiac serologies as well as normal TSH as outpatient (May 2021) -Inpatient GI consult appreciated- recommends:_ -Increase Creon dosing to 72,000 units with meals, 36,000 units with  snacks Imodium prn: may need to have scheduled but will see how she responds to increased Creon dosage MRI abdomen as planned due to concerns about pancreatic insufficiency: I have ordered this for today if possible PET CT as outpatient Lactose-free diet Add probiotic  4)Citrobacter koseri UTI-- continue IV Rocephin, reluctant to switch to Augmentin due to increased risk for diarrhea  5)Acute hypoxic respiratory failure----CT chest without contrast suggest possible primary lung cancer with a spiculated nodule in the left upper lobe -Outpatient PET scans recommended  6)H/o Vascular dementia and prior CVA-aspirin, statin, Aricept and Celexa  7)Recent abnormal CT abdomen/pelvis findings- 02/21/20-  A few indistinct hypodensities over the liver with the best defined over the posterior right lobe measuring 3.1 cm. Findings are indeterminate as metastatic disease is possible. This could also be further evaluated with MRI. -Being followed by gastroenterology,  -MRI not available at [facility] until  Tuesday 03/04/20 -Abdominal MRI ordered per GI service  8)HTN--- hypotension resolved with IV fluids, Norvasc Coreg Lasix and losartan currently on hold  9)DM2-A1c 7.4 reflecting uncontrolled DM PTA -Hold   Tradjenta -c/n  Lantus at 20 units nightly, patient was on 50 units PTA Use Novolog/Humalog Sliding scale insulin with Accu-Cheks/Fingersticks as ordered    Disposition/Need for in-Hospital Stay- patient unable to be discharged at this time due to --acute kidney injury and dehydration requiring continuous IV fluids and close monitoring due to  risk for volume overload and CHF exacerbation -Patient with persistent profuse large-volume diarrhea at risk for recurrent AKI and electrolyte of normalities if work-up not completed -Abdominal MRI pending for 03/04/2020  Status is: Inpatient  Remains inpatient appropriate because:Altered mental status, IV treatments appropriate due to intensity of  illness or inability to take PO and Inpatient level of care appropriate due to severity of illness   Dispo: The patient is from: Home              Anticipated d/c is to: Group home              Anticipated d/c date is: 1              Patient currently is not medically stable to d/c.  Barriers: Not Clinically Stable- acute kidney injury and dehydration requiring continuous IV fluids and close monitoring due to risk for volume overload and CHF exacerbation -Patient with persistent profuse large-volume diarrhea at risk for recurrent AKI and electrolyte of normalities if work-up not completed -Abdominal MRI pending for 03/04/2020  Code Status : Full  Family Communication:   From group home-- -  administrator from group home notified  that MRI currently not available here until 03/04/20 Consults  : Gi  DVT Prophylaxis  :   - Heparin - SCDs   Lab Results  Component Value Date   PLT 180 03/03/2020    Inpatient Medications  Scheduled Meds: . aspirin EC  325 mg Oral Daily  . citalopram  40 mg Oral Daily  . donepezil  10 mg Oral QHS  . heparin  5,000 Units Subcutaneous Q8H  . insulin aspart  0-5 Units Subcutaneous QHS  . insulin aspart  0-9 Units Subcutaneous TID WC  . insulin glargine  20 Units Subcutaneous QHS  . lipase/protease/amylase  36,000 Units Oral With snacks  . lipase/protease/amylase  72,000 Units Oral TID WC  . umeclidinium bromide  1 puff Inhalation Daily   Continuous Infusions: . sodium chloride 50 mL/hr at 03/02/20 1051  . cefTRIAXone (ROCEPHIN)  IV 2 g (03/02/20 1646)   PRN Meds:.acetaminophen **OR** acetaminophen, ipratropium-albuterol, loperamide, ondansetron **OR** ondansetron (ZOFRAN) IV, polyethylene glycol    Anti-infectives (From admission, onward)   Start     Dose/Rate Route Frequency Ordered Stop   02/29/20 1800  cefTRIAXone (ROCEPHIN) 2 g in sodium chloride 0.9 % 100 mL IVPB     2 g 200 mL/hr over 30 Minutes Intravenous Every 24 hours 02/28/20 2005      02/28/20 1915  cefTRIAXone (ROCEPHIN) 1 g in sodium chloride 0.9 % 100 mL IVPB    Note to Pharmacy: Dose adjusted for weight.   1 g 200 mL/hr over 30 Minutes Intravenous  Once 02/28/20 1904 02/28/20 2130   02/28/20 1600  cefTRIAXone (ROCEPHIN) 1 g in sodium chloride 0.9 % 100 mL IVPB     1 g 200 mL/hr over 30 Minutes Intravenous  Once 02/28/20 1511 02/28/20 1627        Objective:   Vitals:   03/02/20 0831 03/02/20 2057 03/03/20 0517 03/03/20 0808  BP:  137/75 140/79   Pulse:  68 61   Resp:  20 (!) 21   Temp:  99.1 F (37.3 C) 98.8 F (37.1 C)   TempSrc:  Oral Oral   SpO2: 96% 97% 99% 94%  Weight:   100.4 kg   Height:        Wt Readings from Last 3 Encounters:  03/03/20 100.4 kg  02/04/20 104.3 kg  01/28/20 106.4 kg     Intake/Output Summary (Last 24 hours) at 03/03/2020 1154 Last data filed at 03/03/2020 0900 Gross per 24 hour  Intake 240 ml  Output 750 ml  Net -510 ml    Physical Exam Gen:-More awake and interactive, no acute distress HEENT:- [place].AT, No sclera icterus Neck-Supple Neck,No JVD,.  Lungs-  CTAB , fair symmetrical air movement CV- S1, S2 normal, regular  Abd-  +ve B.Sounds, Abd Soft, No tenderness, no CVA area tenderness Extremity/Skin:- No  edema, pedal pulses present  Psych-affect is appropriate, oriented x3 Neuro-generalized weakness, no new focal deficits, no tremors   Data Review:   Micro Results Recent Results (from the past 240 hour(s))  Urine Culture     Status: Abnormal   Collection Time: 02/26/20 10:39 AM   Specimen: Urine  Result Value Ref Range Status   MICRO NUMBER: [id number]  Final   SPECIMEN QUALITY: Adequate  Final   Sample Source URINE  Final   STATUS: FINAL  Final   ISOLATE 1: Citrobacter koseri (A)  Final    Comment: Greater than 100,000 CFU/mL of Citrobacter koseri      Susceptibility   Citrobacter koseri - URINE CULTURE, REFLEX    AMOX/CLAVULANIC <=2 Sensitive     CEFAZOLIN* <=4 Not Reportable      * For infections  other than uncomplicated UTIcaused by E. coli, K. pneumoniae or P. mirabilis:Cefazolin is resistant if MIC > or = 8 mcg/mL.(Distinguishing susceptible versus intermediatefor isolates with MIC < or = 4 mcg/mL requiresadditional testing.)For uncomplicated UTI caused by E. coli,K. pneumoniae or P. mirabilis: Cefazolin issusceptible if MIC <32 mcg/mL and predictssusceptible to the oral agents cefaclor, cefdinir,cefpodoxime, cefprozil, cefuroxime, cephalexinand loracarbef.    CEFEPIME <=1 Sensitive     CEFTRIAXONE <=1 Sensitive     CIPROFLOXACIN <=0.25 Sensitive     LEVOFLOXACIN <=0.12 Sensitive     ERTAPENEM <=0.5 Sensitive     GENTAMICIN <=1 Sensitive     IMIPENEM <=0.25 Sensitive     NITROFURANTOIN 32 Sensitive     PIP/TAZO <=4 Sensitive     TOBRAMYCIN <=1 Sensitive     TRIMETH/SULFA* <=20 Sensitive      * For infections other than uncomplicated UTIcaused by E. coli, K. pneumoniae or P. mirabilis:Cefazolin is resistant if MIC > or = 8 mcg/mL.(Distinguishing susceptible versus intermediatefor isolates with MIC < or = 4 mcg/mL requiresadditional testing.)For uncomplicated UTI caused by E. coli,K. pneumoniae or P. mirabilis: Cefazolin issusceptible if MIC <32 mcg/mL and predictssusceptible to the oral agents cefaclor, cefdinir,cefpodoxime, cefprozil, cefuroxime, cephalexinand loracarbef.Legend:S = Susceptible  I = IntermediateR = Resistant  NS = Not susceptible* = Not tested  NR = Not reported**NN = See antimicrobic comments  Blood Culture (routine x 2)     Status: None (Preliminary result)   Collection Time: 02/28/20  3:23 PM   Specimen: Right Antecubital; Blood  Result Value Ref Range Status   Specimen Description RIGHT ANTECUBITAL  Final   Special Requests   Final    BOTTLES DRAWN AEROBIC AND ANAEROBIC Blood Culture adequate volume   Culture   Final    NO GROWTH 4 DAYS Performed at [facility], [address], [place], [place] [address]    Report Status PENDING  Incomplete  Blood Culture  (routine x 2)     Status: None (Preliminary result)   Collection Time: 02/28/20  3:29 PM   Specimen: Left Antecubital; Blood  Result Value Ref Range Status   Specimen Description LEFT ANTECUBITAL  Final  Special Requests   Final    BOTTLES DRAWN AEROBIC AND ANAEROBIC Blood Culture adequate volume   Culture   Final    NO GROWTH 4 DAYS Performed at [facility], [address] [place], [place] [address]    Report Status PENDING  Incomplete  SARS Coronavirus 2 by RT PCR (hospital order, performed in [facility] hospital lab) Nasopharyngeal Nasopharyngeal Swab     Status: None   Collection Time: 02/28/20  4:08 PM   Specimen: Nasopharyngeal Swab  Result Value Ref Range Status   SARS Coronavirus 2 NEGATIVE NEGATIVE Final    Comment: (NOTE) SARS-CoV-2 target nucleic acids are NOT DETECTED. The SARS-CoV-2 RNA is generally detectable in upper and lower respiratory specimens during the acute phase of infection. The lowest concentration of SARS-CoV-2 viral copies this assay can detect is 250 copies / mL. A negative result does not preclude SARS-CoV-2 infection and should not be used as the sole basis for treatment or other patient management decisions.  A negative result may occur with improper specimen collection / handling, submission of specimen other than nasopharyngeal swab, presence of viral mutation(s) within the areas targeted by this assay, and inadequate number of viral copies (<250 copies / mL). A negative result must be combined with clinical observations, patient history, and epidemiological information. Fact Sheet for Patients:   StrictlyIdeas.no Fact Sheet for Healthcare Providers: BankingDealers.co.za This test is not yet approved or cleared  by the [place] FDA and has been authorized for detection and/or diagnosis of SARS-CoV-2 by FDA under an Emergency Use Authorization (EUA).  This EUA will remain in effect (meaning  this test can be used) for the duration of the COVID-19 declaration under Section 564(b)(1) of the Act, 21 U.S.C. section 360bbb-3(b)(1), unless the authorization is terminated or revoked sooner. Performed at [facility], [address], [place], [place] [address]   Urine culture     Status: Abnormal   Collection Time: 02/28/20  5:40 PM   Specimen: In/Out Cath Urine  Result Value Ref Range Status   Specimen Description   Final    IN/OUT CATH URINE Performed at [facility], [address], [place], [place] [address]    Special Requests   Final    NONE Performed at [facility], [address], [place],  [address]    Culture 20,000 COLONIES/mL ENTEROCOCCUS FAECALIS (A)  Final   Report Status 03/01/2020 FINAL  Final   Organism ID, Bacteria ENTEROCOCCUS FAECALIS (A)  Final      Susceptibility   Enterococcus faecalis - MIC*    AMPICILLIN <=2 SENSITIVE Sensitive     NITROFURANTOIN 64 INTERMEDIATE Intermediate     VANCOMYCIN 2 SENSITIVE Sensitive     * 20,000 COLONIES/mL ENTEROCOCCUS FAECALIS    Radiology Reports CT ABDOMEN PELVIS WO CONTRAST  Result Date: 02/21/2020 CLINICAL DATA:  Diarrhea with weight loss.  Diabetes. EXAM: CT ABDOMEN AND PELVIS WITHOUT CONTRAST TECHNIQUE: Multidetector CT imaging of the abdomen and pelvis was performed following the standard protocol without IV contrast. COMPARISON:  None. FINDINGS: Lower chest: Lung bases demonstrate linear scarring over the left lower lobe. There is a 5 x 11 mm nodule over the right lower lobe as well as a 4 mm subpleural nodule over the right middle lobe. Hepatobiliary: Suggestion of several indistinct hypodense liver lesions with the best defined over the posterior right lobe measuring 3.1 cm. Possible 1.6 cm hyperdense nodule over the dome of the medial segment left lobe of the liver. Gallbladder and biliary tree  are unremarkable. Pancreas: Prominence with minimal associated calcification over the body of the pancreas. Spleen:  Normal. Adrenals/Urinary Tract: Adrenal glands are normal. Kidneys are normal in size without hydronephrosis or nephrolithiasis. No focal renal mass. Ureters are normal. Single focus of air over the anterior bladder. Stomach/Bowel: Stomach and small bowel are normal. Appendix is normal. Colon is unremarkable. Vascular/Lymphatic: Minimal calcified plaque over the abdominal aorta which is normal in caliber. Few small nonspecific lymph nodes over the upper abdomen over the gastrohepatic ligament and Peri pancreatic region. Reproductive: Uterus is right of midline.  Ovaries are normal. Other: No free fluid or focal inflammatory change. Musculoskeletal: Mild degenerative changes of the spine and hips. IMPRESSION: 1.  No acute findings in the abdomen/pelvis. 2. Focal prominence with associated calcification over the body of the pancreas. Few small nonspecific adjacent lymph nodes. Recommend further evaluation with MRI. 3. A few indistinct hypodensities over the liver with the best defined over the posterior right lobe measuring 3.1 cm. Findings are indeterminate as metastatic disease is possible. This could also be further evaluated with MRI. 4. Two nodules over the right mid to lower lung with the larger over the lateral right lower lobe measuring 8 mm. Recommend chest CT with contrast for complete evaluation of the lungs and thorax. 5.  Aortic Atherosclerosis (ICD10-I70.0). 6. Tiny focus of air over the anterior bladder which may be due to recent instrumentation although can be seen due to infection or enteric fistula. Electronically Signed   By: Marin Olp M.D.   On: 02/21/2020 16:38   CT CHEST WO CONTRAST  Result Date: 02/28/2020 CLINICAL DATA:  Respiratory failure.  Pulmonary nodules EXAM: CT CHEST WITHOUT CONTRAST TECHNIQUE: Multidetector CT imaging of the chest was performed following the standard protocol without IV contrast. COMPARISON:  CT abdomen 02/21/2020 FINDINGS: Cardiovascular: Heart is normal size.  Aorta is normal caliber. Aortic atherosclerosis. Mediastinum/Nodes: No mediastinal, hilar, or axillary adenopathy. Lungs/Pleura: 13 mm spiculated nodule in the left upper lobe on image 28. Right lower lobe nodule as seen on prior abdominal CT measures 8 mm on image 64. Small subpleural nodule in the right middle lobe measures 4 mm. Mild elevation of the right hemidiaphragm. Bibasilar atelectasis or scarring. No effusions. Upper Abdomen: Ill-defined hypodensities again noted in the liver, best seen in the posterior right hepatic lobe measuring approximately 3.3 cm, similar to prior abdominal CT. Musculoskeletal: Chest wall soft tissues are unremarkable. No acute bony abnormality. IMPRESSION: Previously seen nodules in the right lower lobe and right middle lobe as seen on prior abdominal CT. There is also a spiculated 13 mm nodule in the left upper lobe. Appearance is concerning for possible primary lung cancer. Consider further evaluation with PET CT. Ill-defined low-density lesions in the liver as seen on prior abdominal CT. Cannot exclude metastatic disease. Aortic Atherosclerosis (ICD10-I70.0). Electronically Signed   By: Rolm Baptise M.D.   On: 02/28/2020 20:55   DG Chest Port 1 View  Result Date: 02/28/2020 CLINICAL DATA:  Shortness of breath. EXAM: PORTABLE CHEST 1 VIEW COMPARISON:  Chest x-ray dated July 19, 2018. FINDINGS: The heart size and mediastinal contours are within normal limits. Pulmonary vascular congestion with chronically coarse interstitial markings. New 1.5 cm nodule in the left upper lobe. No focal consolidation, pleural effusion, or pneumothorax. No acute osseous abnormality. IMPRESSION: 1. New 1.5 cm nodule in the left upper lobe. Chest CT is recommended for further evaluation. 2. Mild pulmonary vascular congestion. Electronically Signed   By: Orville Govern.D.  On: 02/28/2020 15:49   ECHOCARDIOGRAM COMPLETE  Result Date: 02/29/2020    ECHOCARDIOGRAM REPORT   Patient Name:    Kathryn Cook Date of Exam: 02/29/2020 Medical Rec #:  [id number]           Height:       64.0 in Accession #:    [id number]          Weight:       223.2 lb Date of Birth:  [birth year]           BSA:          2.050 m Patient Age:    66 years            BP:           105/74 mmHg Patient Gender: F                   HR:           73 bpm. Exam Location:  [facility] Procedure: 2D Echo Indications:    Congestive Heart Failure 428.0 / I50.9  History:        Patient has prior history of Echocardiogram examinations, most                 recent 06/11/2013. CHF, Stroke; Risk Factors:Hypertension,                 Dyslipidemia, Diabetes and Former Smoker. LVH.  Sonographer:    Leavy Cella RDCS (AE) Referring Phys: [place]  1. Left ventricular ejection fraction, by estimation, is 65 to 70%. The left ventricle has normal function. The left ventricle has no regional wall motion abnormalities. There is mild left ventricular hypertrophy. Left ventricular diastolic parameters are consistent with Grade I diastolic dysfunction (impaired relaxation).  2. Right ventricular systolic function is low normal. The right ventricular size is mildly enlarged. There is mildly elevated pulmonary artery systolic pressure.  3. The mitral valve is normal in structure. No evidence of mitral valve regurgitation. No evidence of mitral stenosis.  4. The aortic valve is tricuspid. Aortic valve regurgitation is not visualized. No aortic stenosis is present. FINDINGS  Left Ventricle: Left ventricular ejection fraction, by estimation, is 65 to 70%. The left ventricle has normal function. The left ventricle has no regional wall motion abnormalities. The left ventricular internal cavity size was normal in size. There is  mild left ventricular hypertrophy. Left ventricular diastolic parameters are consistent with Grade I diastolic dysfunction (impaired relaxation). Normal left ventricular filling pressure. Right Ventricle: The  right ventricular size is mildly enlarged. Right vetricular wall thickness was not assessed. Right ventricular systolic function is low normal. There is mildly elevated pulmonary artery systolic pressure. The tricuspid regurgitant velocity is 2.86 m/s, and with an assumed right atrial pressure of 10 mmHg, the estimated right ventricular systolic pressure is 43.3 mmHg. Left Atrium: Left atrial size was normal in size. Right Atrium: Right atrial size was not well visualized. Pericardium: There is no evidence of pericardial effusion. Mitral Valve: The mitral valve is normal in structure. No evidence of mitral valve regurgitation. No evidence of mitral valve stenosis. Tricuspid Valve: The tricuspid valve is normal in structure. Tricuspid valve regurgitation is mild . No evidence of tricuspid stenosis. Aortic Valve: The aortic valve is tricuspid. Aortic valve regurgitation is not visualized. No aortic stenosis is present. Aortic valve mean gradient measures 4.0 mmHg. Aortic valve peak gradient measures 7.4 mmHg. Aortic valve area, by VTI measures  2.61 cm. Pulmonic Valve: The pulmonic valve was not well visualized. Pulmonic valve regurgitation is not visualized. No evidence of pulmonic stenosis. Aorta: The aortic root is normal in size and structure. Pulmonary Artery: Indeterminant PASP, IVC poorly visualized. Venous: The inferior vena cava was not well visualized. IAS/Shunts: The interatrial septum was not well visualized.  LEFT VENTRICLE PLAX 2D LVIDd:         4.15 cm  Diastology LVIDs:         1.76 cm  LV e' lateral:   8.38 cm/s LV PW:         1.12 cm  LV E/e' lateral: 4.3 LV IVS:        1.17 cm  LV e' medial:    5.66 cm/s LVOT diam:     2.00 cm  LV E/e' medial:  6.4 LV SV:         55 LV SV Index:   27 LVOT Area:     3.14 cm  RIGHT VENTRICLE RV S prime:     8.05 cm/s TAPSE (M-mode): 1.6 cm LEFT ATRIUM           Index       RIGHT ATRIUM           Index LA diam:      2.10 cm 1.02 cm/m  RA Area:     13.30 cm LA Vol  (A2C): 21.8 ml 10.64 ml/m RA Volume:   36.20 ml  17.66 ml/m LA Vol (A4C): 32.1 ml 15.66 ml/m  AORTIC VALVE AV Area (Vmax):    2.37 cm AV Area (Vmean):   2.25 cm AV Area (VTI):     2.61 cm AV Vmax:           135.74 cm/s AV Vmean:          96.225 cm/s AV VTI:            0.210 m AV Peak Grad:      7.4 mmHg AV Mean Grad:      4.0 mmHg LVOT Vmax:         102.35 cm/s LVOT Vmean:        68.803 cm/s LVOT VTI:          0.174 m LVOT/AV VTI ratio: 0.83  AORTA Ao Root diam: 3.00 cm MITRAL VALVE               TRICUSPID VALVE MV Area (PHT): 1.98 cm    TR Peak grad:   32.7 mmHg MV Decel Time: 384 msec    TR Vmax:        286.00 cm/s MV E velocity: 36.40 cm/s MV A velocity: 75.20 cm/s  SHUNTS MV E/A ratio:  0.48        Systemic VTI:  0.17 m                            Systemic Diam: 2.00 cm Carlyle Dolly MD Electronically signed by Carlyle Dolly MD Signature Date/Time: 02/29/2020/10:45:25 AM    Final      CBC Recent Labs  Lab 02/26/20 1039 02/28/20 1522 03/02/20 0405 03/03/20 0506  WBC 11.4* 9.5 10.8* 10.3  HGB 12.9 12.5 11.4* 11.7*  HCT 41.3 40.6 37.3 37.8  PLT 235 243 181 180  MCV 85.9 85.5 86.3 87.1  MCH 26.8* 26.3 26.4 27.0  MCHC 31.2* 30.8 30.6 31.0  RDW 14.9 16.5* 16.7* 16.8*  LYMPHSABS 1,562 2.0  --   --  MONOABS  --  0.9  --   --   EOSABS 125 0.1  --   --   BASOSABS 80 0.1  --   --     Chemistries  Recent Labs  Lab 02/26/20 1039 02/28/20 1522 02/29/20 0551 03/02/20 0405 03/03/20 0506  NA 142 139 141 144 145  K 3.9 3.7 4.1 4.0 4.1  CL 107 110 112* 117* 117*  CO2 21 17* 20* 19* 20*  GLUCOSE 237* 47* 271* 185* 181*  BUN 56* 60* 49* 30* 26*  CREATININE 2.88* 3.60* 2.86* 1.96* 1.75*  CALCIUM 9.9 9.3 8.2* 8.8* 9.0  AST 13 15  --  14* 15  ALT 10 12  --  13 12  ALKPHOS  --  84  --  70 66  BILITOT 0.5 0.5  --  0.4 0.2*   ------------------------------------------------------------------------------------------------------------------ No results for input(s): CHOL, HDL, LDLCALC,  TRIG, CHOLHDL, LDLDIRECT in the last 72 hours.  Lab Results  Component Value Date   HGBA1C 7.4 (A) 01/28/2020   ------------------------------------------------------------------------------------------------------------------ No results for input(s): TSH, T4TOTAL, T3FREE, THYROIDAB in the last 72 hours.  Invalid input(s): FREET3 ------------------------------------------------------------------------------------------------------------------ No results for input(s): VITAMINB12, FOLATE, FERRITIN, TIBC, IRON, RETICCTPCT in the last 72 hours.  Coagulation profile Recent Labs  Lab 02/28/20 1522  INR 1.3*    No results for input(s): DDIMER in the last 72 hours.  Cardiac Enzymes No results for input(s): CKMB, TROPONINI, MYOGLOBIN in the last 168 hours.  Invalid input(s): CK ------------------------------------------------------------------------------------------------------------------    Component Value Date/Time   BNP 356.0 (H) 02/28/2020 1522   Roxan Hockey M.D on 03/03/2020 at 11:54 AM  Go to [email or web address] - for contact info  [facility] - Office  [phone]

## 2020-03-03 NOTE — Consult Note (Addendum)
Referring Provider: Dr. Roxan Hockey  Primary Care Physician:  [name], MD Primary Gastroenterologist:  Dr. Gala Romney   Date of Admission: 02/28/20 Date of Consultation: 03/03/20  Reason for Consultation:  Persistent large volume profuse diarrhea   HPI:  Kathryn Cook is a 69 y.o. year old female residing at [facility], admitted with acute renal injury in the setting of diarrhea, poor oral intake, UTI, diastolic heart failure, with GI consult requested due to persistent large volume of diarrhea. She had been evaluated via video visit with [facility] 02/08/2020 with 6 week history of watery diarrhea, postprandial, poor appetite, weight loss. Cdiff and GI pathogen panel negative 5/18, celiac serologies negative, TSH 1.39. CT abd/pelvis with oral contrast only showed focal prominence with associated calcification over pancreas body, few small nonspecific adjacent lymph nodes, few indistinct hypodensities over the liver, two nodule over the right mid to lower lung. Tiny focus of air also anterior bladder which could be due to infection or enteric fistula. MRI of pancreas and CT chest recommended. CT chest with concern for primary lung cancer, recommending PET CT. Ill-defined low-density lesions in liver. MR abdomen had already been scheduled as outpatient and happens to be for tomorrow, 6/8. Patient continues with multiple large volume stools per day.   Although she has a history of dementia, she is alert to person, place, situation, and time. Notes she is still "running off". No rectal bleeding. No abdominal pain. No GERD. Denies dysphagia. No N/V. Sometimes postprandial, sometimes not. 2-3 stools per day per patient. I spoke with nursing staff who states at least 3 large volume yesterday. She states her appetite is not the best. She does believe she has lost weight. Per documentation at time of video visit, she had lost 20 lbs over the past 6 weeks. She did have exposure to  antibiotics for UTI prior to diarrhea onset, but Cdiff was negative as outpatient. Baseline BMs prior to diarrhea was 1-2 per day. Pancreatic enzymes recommended 5/27 after CT was reviewed. Imodium had been helpful with decreasing diarrhea per facility report.   Creon 24,000 units with meals started this admission. She is on IV Rocephin for UTI.   Colonoscopy Nov 2019: two 4-5 mm polyps in descending colon and at IC valve. Tubular adenomas.   Past Medical History:  Diagnosis Date   Bronchitis    CVA (cerebral infarction)    Diabetes mellitus without complication ([place])    Hyperlipidemia    Hypertension    Memory loss    Stroke [facility])    Substance abuse ([place])    alcohol, but patient states she doesn't drink that much anymore    Past Surgical History:  Procedure Laterality Date   COLONOSCOPY N/A 08/22/2018   two 4-5 mm polyps in descending colon and at IC valve. Tubular adenomas.    POLYPECTOMY  08/22/2018   Procedure: POLYPECTOMY;  Surgeon: Daneil Dolin, MD;  Location: [facility];  Service: Endoscopy;;  colon    Prior to Admission medications   Medication Sig Start Date End Date Taking? Authorizing Provider  albuterol (VENTOLIN HFA) 108 (90 Base) MCG/ACT inhaler Inhale into the lungs every 6 (six) hours as needed for wheezing or shortness of breath.   Yes [provider]  amLODipine (NORVASC) 5 MG tablet Take 5 mg by mouth every evening.    Yes [provider]  aspirin EC 325 MG tablet Take 325 mg by mouth daily.   Yes [provider]  carvedilol (  COREG) 12.5 MG tablet Take 12.5 mg by mouth 2 (two) times daily with a meal.   Yes [provider]  diphenoxylate-atropine (LOMOTIL) 2.5-0.025 MG tablet Take 1 tablet by mouth every 6 (six) hours as needed for diarrhea or loose stools.   Yes [provider]  donepezil (ARICEPT) 10 MG tablet Take 10 mg by mouth at bedtime.   Yes [provider]  furosemide (LASIX) 40 MG  tablet Take 40 mg by mouth daily.   Yes [provider]  Insulin Glargine (BASAGLAR KWIKPEN) 100 UNIT/ML SOPN INJECT 50 UNITS SUBCUTANEOUSLY AT BEDTIME. Patient taking differently: Inject 50 Units into the skin at bedtime.  07/17/19  Yes Nida, Marella Chimes, MD  ketoconazole (NIZORAL) 2 % cream Apply 1 application topically daily.   Yes [provider]  linagliptin (TRADJENTA) 5 MG TABS tablet Take 5 mg by mouth daily.   Yes [provider]  loperamide (IMODIUM) 2 MG capsule TAKE 1 CAPSULE (2MG  TOTAL) BY MOUTH 4 (FOUR) TIMES DAILY AS NEEDED FOR DIARRHEA OR LOOSE STOOLS. 02/27/20  Yes Jodi Mourning, Kristen S, PA-C  losartan (COZAAR) 100 MG tablet Take 100 mg by mouth daily.   Yes [provider]  Multiple Vitamin (MULTIVITAMIN) tablet Take 1 tablet by mouth daily.   Yes [provider]  NOVOLOG FLEXPEN 100 UNIT/ML FlexPen INJECT SQ SLIDING SCALE 3 TIMES DAILY WITH MEALS 90-150=10U:151-200=11U:20 1-250=12U:251-300=13U:301 -350=14U:351-400=15U:ABOV E400 CALL MD Patient taking differently: Inject 10-15 Units into the skin See admin instructions. Sliding Scale 3 times daily with meals <90-HOLD 90-150=10 units 151-200=11units 201-250=12units 251-300=13units 301-350=14units 351-400=15units BS>400=16units` 11/30/19  Yes Nida, Marella Chimes, MD  Pancrelipase, Lip-Prot-Amyl, (CREON) 24000-76000 units CPEP Take 2 capsules with meals 3 times daily and 1 capsule with snacks up to twice daily. Patient taking differently: Take 1-2 capsules by mouth See admin instructions. ZTake 2 capsules with meals 3 times daily and 1 capsule with snacks up to twice daily. 02/22/20  Yes Erenest Rasher, PA-C  PARoxetine (PAXIL) 20 MG tablet Take 20 mg by mouth daily.   Yes [provider]  Propylene Glycol (SYSTANE COMPLETE OP) Apply 1 drop to eye 3 (three) times daily.    Yes [provider]  simvastatin (ZOCOR) 20 MG tablet Take 20 mg by mouth every evening.    Yes  [provider]  umeclidinium bromide (INCRUSE ELLIPTA) 62.5 MCG/INH AEPB Inhale 1 puff into the lungs daily.   Yes [provider]  citalopram (CELEXA) 40 MG tablet Take 40 mg by mouth daily. 02/27/20   [provider]  COMBIVENT RESPIMAT 20-100 MCG/ACT AERS respimat Inhale 1 puff into the lungs 4 (four) times daily as needed for wheezing or shortness of breath (cough).  06/05/19   [provider]    Current Facility-Administered Medications  Medication Dose Route Frequency Provider Last Rate Last Admin   0.9 %  sodium chloride infusion   Intravenous Continuous Emokpae, Courage, MD 50 mL/hr at 03/02/20 1051 Rate Change at 03/02/20 1051   acetaminophen (TYLENOL) tablet 650 mg  650 mg Oral Q6H PRN Emokpae, Ejiroghene E, MD       Or   acetaminophen (TYLENOL) suppository 650 mg  650 mg Rectal Q6H PRN Emokpae, Ejiroghene E, MD       aspirin EC tablet 325 mg  325 mg Oral Daily Emokpae, Ejiroghene E, MD   325 mg at 03/03/20 0838   cefTRIAXone (ROCEPHIN) 2 g in sodium chloride 0.9 % 100 mL IVPB  2 g Intravenous Q24H Emokpae,  Ejiroghene E, MD 200 mL/hr at 03/02/20 1646 2 g at 03/02/20 1646   citalopram (CELEXA) tablet 40 mg  40 mg Oral Daily Emokpae, Ejiroghene E, MD   40 mg at 03/03/20 0838   donepezil (ARICEPT) tablet 10 mg  10 mg Oral QHS Emokpae, Ejiroghene E, MD   10 mg at 03/02/20 2105   heparin injection 5,000 Units  5,000 Units Subcutaneous Q8H Emokpae, Ejiroghene E, MD   5,000 Units at 03/02/20 2106   insulin aspart (novoLOG) injection 0-5 Units  0-5 Units Subcutaneous QHS Emokpae, Ejiroghene E, MD       insulin aspart (novoLOG) injection 0-9 Units  0-9 Units Subcutaneous TID WC Emokpae, Ejiroghene E, MD   2 Units at 03/03/20 0837   insulin glargine (LANTUS) injection 20 Units  20 Units Subcutaneous QHS Emokpae, Courage, MD   20 Units at 03/02/20 2106   ipratropium-albuterol (DUONEB) 0.5-2.5 (3) MG/3ML nebulizer solution 3 mL  3 mL Nebulization Q6H PRN  Donnamae Jude, [facility]       lipase/protease/amylase (CREON) capsule 24,000 Units  24,000 Units Oral TID WC Emokpae, Ejiroghene E, MD   24,000 Units at 03/03/20 3086   loperamide (IMODIUM) capsule 2 mg  2 mg Oral QID PRN Emokpae, Ejiroghene E, MD       ondansetron (ZOFRAN) tablet 4 mg  4 mg Oral Q6H PRN Emokpae, Ejiroghene E, MD       Or   ondansetron (ZOFRAN) injection 4 mg  4 mg Intravenous Q6H PRN Emokpae, Ejiroghene E, MD       polyethylene glycol (MIRALAX / GLYCOLAX) packet 17 g  17 g Oral Daily PRN Emokpae, Ejiroghene E, MD       umeclidinium bromide (INCRUSE ELLIPTA) 62.5 MCG/INH 1 puff  1 puff Inhalation Daily Emokpae, Ejiroghene E, MD   1 puff at 03/03/20 0808    Allergies as of 02/28/2020 - Review Complete 02/28/2020  Allergen Reaction Noted   Ace inhibitors Cough 10/01/2013   Diovan [valsartan] Cough 10/18/2013    Family History  Problem Relation Age of Onset   Other Mother        unknown   Other Father        unknown   Colon cancer Neg Hx     Social History   Socioeconomic History   Marital status: Single    Spouse name: Not on file   Number of children: 1   Years of education: 11   Highest education level: Not on file  Occupational History   Occupation: unemployed  Tobacco Use   Smoking status: Former Smoker    Packs/day: 0.25    Years: 45.00    Pack years: 11.25    Types: Cigarettes    Quit date: 09/27/2013    Years since quitting: 6.4   Smokeless tobacco: Never Used  Substance and Sexual Activity   Alcohol use: No    Comment: occasional   Drug use: No    Types: Marijuana    Comment: last used 3 months ago   Sexual activity: Yes  Other Topics Concern   Not on file  Social History Narrative   Patient is single and lives at [facility] home.   Patient has one child.   Patient is right-handed.   Patient drinks two cups of coffee daily.   Patient has an 11 th grade education.   Social Determinants of Health    Financial Resource Strain:    Difficulty of Paying Living Expenses:   Food  Insecurity:    Worried About Charity fundraiser in the Last Year:    Arboriculturist in the Last Year:   Transportation Cook:    Film/video editor (Medical):    Lack of Transportation (Non-Medical):   Physical Activity:    Days of Exercise per Week:    Minutes of Exercise per Session:   Stress:    Feeling of Stress :   Social Connections:    Frequency of Communication with Friends and Family:    Frequency of Social Gatherings with Friends and Family:    Attends Religious Services:    Active Member of Clubs or Organizations:    Attends Music therapist:    Marital Status:   Intimate Partner Violence:    Fear of Current or Ex-Partner:    Emotionally Abused:    Physically Abused:    Sexually Abused:     Review of Systems: Gen: see HPI CV: Denies chest pain, heart palpitations, syncope, edema  Resp: Denies shortness of breath with rest, cough, wheezing GI: see HPI GU : Denies urinary burning, urinary frequency, urinary incontinence.  MS: Denies joint pain,swelling, cramping Derm: Denies rash, itching, dry skin Psych: Denies depression, anxiety,confusion, or memory loss Heme: see HPI  Physical Exam: Vital signs in last 24 hours: Temp:  [98.8 F (37.1 C)-99.1 F (37.3 C)] 98.8 F (37.1 C) (06/07 0517) Pulse Rate:  [61-68] 61 (06/07 0517) Resp:  [20-21] 21 (06/07 0517) BP: (137-140)/(75-79) 140/79 (06/07 0517) SpO2:  [94 %-99 %] 94 % (06/07 0808) Weight:  [100.4 kg] 100.4 kg (06/07 0517) Last BM Date: 02/29/20 General:   Alert,  Well-developed, well-nourished, pleasant and cooperative in NAD Head:  Normocephalic and atraumatic. Eyes:  Sclera clear, no icterus.   Conjunctiva pink. Ears:  Normal auditory acuity. Nose:  No deformity, discharge,  or lesions. Mouth:  edentulous Lungs:  Clear throughout to auscultation.   Heart:  S1 S2 present without  murmurs Abdomen:  Soft, obese, nontender and nondistended. No masses, hepatosplenomegaly or hernias noted. Normal bowel sounds, without guarding, and without rebound.   Rectal:  Deferred  Msk:  Symmetrical without gross deformities. Normal posture. Extremities:  With pedal edema, 1-2+ pitting lower extremity edema. Neurologic:  Alert and  oriented x4;   Psych:  Alert and cooperative. Normal mood and affect.  Intake/Output from previous day: 06/06 0701 - 06/07 0700 In: 0  Out: 450 [Urine:450] Intake/Output this shift: No intake/output data recorded.  Lab Results: Recent Labs    03/02/20 0405 03/03/20 0506  WBC 10.8* 10.3  HGB 11.4* 11.7*  HCT 37.3 37.8  PLT 181 180   BMET Recent Labs    03/02/20 0405 03/03/20 0506  NA 144 145  K 4.0 4.1  CL 117* 117*  CO2 19* 20*  GLUCOSE 185* 181*  BUN 30* 26*  CREATININE 1.96* 1.75*  CALCIUM 8.8* 9.0   LFT Recent Labs    03/02/20 0405 03/03/20 0506  PROT 6.4* 6.5  ALBUMIN 2.9* 2.8*  AST 14* 15  ALT 13 12  ALKPHOS 70 66  BILITOT 0.4 0.2*    Studies/Results: 02/21/2020 CT abd/pelvis without IV contrast, oral only:  IMPRESSION: 1.  No acute findings in the abdomen/pelvis.  2. Focal prominence with associated calcification over the body of the pancreas. Few small nonspecific adjacent lymph nodes. Recommend further evaluation with MRI.  3. A few indistinct hypodensities over the liver with the best defined over the posterior right lobe measuring 3.1 cm. Findings  are indeterminate as metastatic disease is possible. This could also be further evaluated with MRI.  4. Two nodules over the right mid to lower lung with the larger over the lateral right lower lobe measuring 8 mm. Recommend chest CT with contrast for complete evaluation of the lungs and thorax.  5.  Aortic Atherosclerosis (ICD10-I70.0).  6. Tiny focus of air over the anterior bladder which may be due to recent instrumentation although can be seen due  to infection or enteric fistula.   02/28/2020 CT chest without contrast:  IMPRESSION: Previously seen nodules in the right lower lobe and right middle lobe as seen on prior abdominal CT. There is also a spiculated 13 mm nodule in the left upper lobe. Appearance is concerning for possible primary lung cancer. Consider further evaluation with PET CT.  Ill-defined low-density lesions in the liver as seen on prior abdominal CT. Cannot exclude metastatic disease.  Aortic Atherosclerosis (ICD10-I70.0).  Impression: Pleasant 69 year old female, resident of [facility], admitted with acute renal injury in setting of persistent diarrhea, UTI, diastolic heart failure, acute respiratory failure, with GI consult requested due to diarrhea that has now persisted over 2 months.   Persistent diarrhea: outpatient evaluation mid May 2021 with negative Cdiff, GI pathogen panel, normal celiac serologies, normal TSH. Some concern for Cdiff initially as she had been exposed to antibiotic prior to onset of diarrhea, but this was negative. CT without IV contrast showed concerning findings including focal prominence with associated calcification over pancreas body, indistinct hypodensities of liver, lung nodules, and tiny focus of air over anterior bladder that could be due to infection or enteric fistula. Pancreatic enzymes had been recommended. I doubt dealing with persistent infectious etiology given the prolonged course. Her weight loss is concerning. As MRI pancreas had already been arranged as outpatient for tomorrow, we will continue with that plan to further evaluate the pancreas and liver. Pancreatic insufficiency could very well be playing a role. I do note that the facility reported some improvement with Imodium. Creon dosage will need to be increased to maximize efficacy and ensure weight-based dosing. Colonoscopy fairly recently (Nov 2019) with tubular adenomas. Hold off on colonoscopy at this time. Will  also hold off on repeat Cdiff, as she has a negative result on file and diarrhea has remained unchanged.   Abnormal CT chest without contrast: performed this admission with concern for possible primary lung cancer. PET CT recommended as outpatient.     Plan:  Increase Creon dosing to 72,000 units with meals, 36,000 units with snacks  Imodium prn: may need to have scheduled but will see how she responds to increased Creon dosage  MRI abdomen as planned: I have ordered this for today if possible  PET CT as outpatient  Lactose-free diet  Add probiotic  Will continue to follow with you   Kathryn Needs, PhD, ANP-BC [facility]       LOS: 4 days    03/03/2020, 9:39 AM

## 2020-03-04 ENCOUNTER — Inpatient Hospital Stay (HOSPITAL_COMMUNITY): Payer: Medicare Other

## 2020-03-04 ENCOUNTER — Other Ambulatory Visit (HOSPITAL_COMMUNITY): Payer: Medicare Other

## 2020-03-04 DIAGNOSIS — R197 Diarrhea, unspecified: Secondary | ICD-10-CM

## 2020-03-04 DIAGNOSIS — K8689 Other specified diseases of pancreas: Secondary | ICD-10-CM | POA: Diagnosis present

## 2020-03-04 LAB — CULTURE, BLOOD (ROUTINE X 2)
Culture: NO GROWTH
Culture: NO GROWTH
Special Requests: ADEQUATE
Special Requests: ADEQUATE

## 2020-03-04 LAB — GLUCOSE, CAPILLARY
Glucose-Capillary: 115 mg/dL — ABNORMAL HIGH (ref 70–99)
Glucose-Capillary: 90 mg/dL (ref 70–99)
Glucose-Capillary: 139 mg/dL — ABNORMAL HIGH (ref 70–99)
Glucose-Capillary: 184 mg/dL — ABNORMAL HIGH (ref 70–99)
Glucose-Capillary: 128 mg/dL — ABNORMAL HIGH (ref 70–99)

## 2020-03-04 MED ORDER — SODIUM CHLORIDE 0.9 % IV SOLN
1.0000 g | Freq: Four times a day (QID) | INTRAVENOUS | Status: DC
  Administered 2020-03-04 – 2020-03-07 (×13): 1 g via INTRAVENOUS
  Filled 2020-03-04 (×24): qty 1000

## 2020-03-04 MED ORDER — GADOBUTROL 1 MMOL/ML IV SOLN
10.0000 mL | Freq: Once | INTRAVENOUS | Status: AC | PRN
  Administered 2020-03-04: 10 mL via INTRAVENOUS

## 2020-03-04 NOTE — Progress Notes (Signed)
Subjective: 3 documented stools between 6/7-6/8 (7am-7am). Patient reports having 2-3 BMs yesterday. None this morning. Consistency is mushy. Feels diarrhea is getting better. States appetite is ok. Doesn't want to eat because the food isn't good. Had her orange and applesauce this morning. A couple bites of pancakes. Denies any trouble swallowing, nausea, vomiting, or abdominal pain. I watched her eat the applesauce which she did without any trouble.   Alert and oriented to person, place, year, and situation.   Spoke with nursing staff. No report of BMs or consistency received at shift change. Asked ashley to keep close count of BMs today and consistency.   Objective: Vital signs in last 24 hours: Temp:  [98.2 F (36.8 C)-99.2 F (37.3 C)] 98.2 F (36.8 C) (06/08 0515) Pulse Rate:  [63-69] 69 (06/08 0515) Resp:  [16-20] 20 (06/08 0515) BP: (135-146)/(76-89) 137/89 (06/08 0515) SpO2:  [94 %-100 %] 94 % (06/08 0733) Weight:  [99.9 kg] 99.9 kg (06/08 0515) Last BM Date: 03/02/20 General:   Alert and oriented, pleasant Head:  Normocephalic and atraumatic. Eyes:  No icterus, sclera clear. Conjuctiva pink.  Abdomen:  Bowel sounds present, soft, non-tender, non-distended. No HSM or hernias noted. No rebound or guarding. No masses appreciated  Extremities:  With edema. Neurologic:  Alert and  oriented x4;  grossly normal neurologically. Psych:  Normal mood and affect.  Intake/Output from previous day: 06/07 0701 - 06/08 0700 In: 920 [P.O.:920] Out: 1200 [Urine:1200] Intake/Output this shift: No intake/output data recorded.  Lab Results: Recent Labs    03/02/20 0405 03/03/20 0506  WBC 10.8* 10.3  HGB 11.4* 11.7*  HCT 37.3 37.8  PLT 181 180   BMET Recent Labs    03/02/20 0405 03/03/20 0506  NA 144 145  K 4.0 4.1  CL 117* 117*  CO2 19* 20*  GLUCOSE 185* 181*  BUN 30* 26*  CREATININE 1.96* 1.75*  CALCIUM 8.8* 9.0   LFT Recent Labs    03/02/20 0405  03/03/20 0506  PROT 6.4* 6.5  ALBUMIN 2.9* 2.8*  AST 14* 15  ALT 13 12  ALKPHOS 70 71  BILITOT 0.4 0.2*   Assessment: 69 year old female, resident of [facility], admitted with acute renal injury in setting of persistent diarrhea, UTI (currently being treated with rocephin), diastolic heart failure, acute respiratory failure, with GI consult requested due to diarrhea that has now persisted over 2 months.   Persistent diarrhea: Outpatient evaluation mid May 2021 with negative Cdiff, GI pathogen panel, normal celiac serologies, normal TSH. Some concern for Cdiff initially as she had been exposed to antibiotic prior to onset of diarrhea, but this was negative. CT without IV contrast showed concerning findings including focal prominence with associated calcification over pancreas body, indistinct hypodensities of liver, lung nodules, and tiny focus of air over anterior bladder that could be due to infection or enteric fistula. Pancreatic enzymes had been recommended and she was scheduled for MRI to be completed 6/8. Notably, facility had reported some improvement with imodium initially. Last TCS November 2019 with tubular adenomas. Over the last 24 hours, appears patient has had 3 BMs. No documentation of stool consistency and nursing staff this morning is unaware as well. No BM thus far today.  Doubt persistent infectious etiology given prolonged course. May very well have pancreatic insufficiency. Pancreatic enzymes were increased yesterday to ensure weight-based dosing and probiotic was added. Imodium is ordered as PRN as has not been given. Will see how patient does today. If diarrhea is  unchanged with increasing pancreatic enyzymes, will schedule imodium. Hold off on repeat TCS for now.   Notably, she is scheduled for MRI today to follow-up on abnormal CT A/P findings of liver and pancreas. She had CT chest this admission that was also abnormal with concern for possible primary lung cancer with  recommendations for PET CT outpatient. Concerned that malignancy may be contributing to this patients overall clinical picture including weight loss.  Plan: Continue Creon dosing, 72,000 units with meals, 36,000 units with snacks. Consider changing imodium to scheduled if diarrhea is unchanged with increased Creon. Will need to follow-up with nursing staff later today. Lactose free diet.  Continue Probiotic.   MRI Abdomen today.  PET CT outpatient.    LOS: 5 days    03/04/2020, 8:19 AM   Aliene Altes, PA-C [facility]

## 2020-03-04 NOTE — Progress Notes (Addendum)
Patient Demographics:    Kathryn Cook, is a 69 y.o. female, DOB - 02/19/1951, XKG:[id number]  Admit date - 02/28/2020   Admitting Physician Kathryn Arlyce Dice, MD  Outpatient Primary MD for the patient is Kathryn Fire, MD  LOS - 5   Chief Complaint  Patient presents with  . Urinary Tract Infection        Subjective:    Kathryn Cook today has no fevers, no emesis,  No chest pain,   -Patient states food does not taste right, still reluctant to eat, denies any significant abdominal pain at this time  Assessment  & Plan :    Principal Problem:   Pancreatic mass/-Presumed metastatic stage IV pancreatic cancer with liver mets Active Problems:   HTN (hypertension)   Kidney disease, chronic, stage III (GFR 30-59 ml/min)   Diastolic congestive heart failure, NYHA class 1 (HCC)   H/O: CVA (cerebrovascular accident)   Dementia ([place])   Type II diabetes mellitus with neurological manifestations ([place])   Essential hypertension, benign   Diarrhea   AKI (acute kidney injury) ([place])  Brief Summary:- 69 y.o. female with medical history significant for diastolic CHF, CKD, diabetes mellitus, dementia, CVA, hypertension admitted on 02/28/20 with AKI in the setting of diarrhea   -MRCP Abdomen IMPRESSION: 1) Large hypoenhancing mass involving the body and tail of pancreas is highly concerning for primary pancreatic adenocarcinoma. There is evidence for encasement of the superior mesenteric artery and possible involvement of the celiac trunk and portal venous confluence.  2) Multifocal rim enhancing lesions involving both lobes of liver suspicious for metastatic disease.   --Presumed metastatic stage IV pancreatic cancer with liver mets Discussed with Pt with RN Kathryn Cook,  Spoke with Pt's sister face to face --- Kathryn Cook [phone] and I called and spoke with ALF/Group home supervisor Kathryn  Larwance Sachs Cook----[phone]  A/p 1)AKI----acute kidney injury on CKD stage - IIIB   creatinine on admission= 3.6, baseline creatinine = around 2    , creatinine is now= 1.75, renally adjust medications, avoid nephrotoxic agents / dehydration  / hypotension  -Worsening renal function related to dehydration in the setting of persistent diarrhea and GI losses -Renal function improving with hydration  2)HFpEF--Echo with EF of 65 to 70% and grade 1 diastolic dysfunction--- be judicious with IV fluids  3) chronic diarrhea--47-month duration--extensive outpatient GI work-up, C. difficile and GI pathogen negative and normal celiac serologies as well as normal TSH as outpatient (May 2021) -Inpatient GI consult appreciated ---c/n Creon 72,000 units with meals, 36,000 units with snacks Imodium prn: may need to have scheduled but will see how she responds to increased Creon dosage MRI abdomen --with --Presumed metastatic stage IV pancreatic cancer with liver mets Discussed with Pt with RN Kathryn Cook,  Spoke with Pt's sister face to face --- Kathryn Cook [phone] and I called and spoke with ALF/Group home supervisor Kathryn Cook----[phone]  4)Citrobacter koseri and Enterococcus faecalis UTI--okay to stop IV Rocephin, -Treat empirically with ampicillin -starting 03/04/2020  5)Acute hypoxic respiratory failure----CT chest without contrast suggest possible primary lung cancer with a spiculated nodule in the left upper lobe -Outpatient PET scans recommended  6)H/o Vascular dementia and prior CVA-aspirin, statin, Aricept and Celexa  7)Recent abnormal CT abdomen/pelvis  findings- 02/21/20-  A few indistinct hypodensities over the liver with the best defined over the posterior right lobe measuring 3.1 cm. Findings are indeterminate as metastatic disease is possible. This could also be further evaluated with MRI. -Being followed by gastroenterology,  -Abdominal MRI -with -Presumed  metastatic stage IV pancreatic cancer with liver mets Discussed with Pt with RN Kathryn Cook,  Spoke with Pt's sister face to face --- Kathryn Cook [phone] and I called and spoke with ALF/Group home supervisor Kathryn Cook----[phone]  8)HTN--- hypotension resolved with IV fluids, Norvasc Coreg Lasix and losartan currently on hold  9)DM2-A1c 7.4 reflecting uncontrolled DM PTA -Hold   Tradjenta -c/n  Lantus at 20 units nightly, patient was on 50 units PTA Use Novolog/Humalog Sliding scale insulin with Accu-Cheks/Fingersticks as ordered    Disposition/Need for in-Hospital Stay- patient unable to be discharged at this time due to --acute kidney injury and dehydration requiring continuous IV fluids and close monitoring due to risk for volume overload and CHF exacerbation -Patient with persistent profuse large-volume diarrhea at risk for recurrent AKI and electrolyte of normalities if work-up not completed -Abdominal MRI- with --Presumed metastatic stage IV pancreatic cancer with liver mets --Awaiting family decision on possible hospice involvement  Status is: Inpatient  Remains inpatient appropriate because:Altered mental status, IV treatments appropriate due to intensity of illness or inability to take PO and Inpatient level of care appropriate due to severity of illness   Dispo: The patient is from: Home              Anticipated d/c is to: Group home              Anticipated d/c date is: 1              Patient currently is not medically stable to d/c.  Barriers: Not Clinically Stable- acute kidney injury and dehydration requiring continuous IV fluids and close monitoring due to risk for volume overload and CHF exacerbation -Patient with persistent profuse large-volume diarrhea at risk for recurrent AKI and electrolyte of normalities if work-up not completed -Abdominal MRI with stage IV pancreatic cancer -Awaiting family decision on possible hospice involvement  Code  Status : Full  Family Communication:   From group home-- -  administrator from group home notified  that MRI currently not available here until 03/04/20 Consults  : Gi  DVT Prophylaxis  :   - Heparin - SCDs   Lab Results  Component Value Date   PLT 180 03/03/2020    Inpatient Medications  Scheduled Meds: . aspirin EC  325 mg Oral Daily  . citalopram  40 mg Oral Daily  . donepezil  10 mg Oral QHS  . heparin  5,000 Units Subcutaneous Q8H  . insulin aspart  0-5 Units Subcutaneous QHS  . insulin aspart  0-9 Units Subcutaneous TID WC  . insulin glargine  20 Units Subcutaneous QHS  . lipase/protease/amylase  36,000 Units Oral With snacks  . lipase/protease/amylase  72,000 Units Oral TID WC  . saccharomyces boulardii  250 mg Oral BID  . umeclidinium bromide  1 puff Inhalation Daily   Continuous Infusions: . sodium chloride 50 mL/hr at 03/04/20 1529  . ampicillin (OMNIPEN) IV 1 g (03/04/20 1532)   PRN Meds:.acetaminophen **OR** acetaminophen, ipratropium-albuterol, loperamide, ondansetron **OR** ondansetron (ZOFRAN) IV, polyethylene glycol    Anti-infectives (From admission, onward)   Start     Dose/Rate Route Frequency Ordered Stop   03/04/20 1200  ampicillin (OMNIPEN) 1 g in sodium  chloride 0.9 % 100 mL IVPB     1 g 300 mL/hr over 20 Minutes Intravenous Every 6 hours 03/04/20 0959     02/29/20 1800  cefTRIAXone (ROCEPHIN) 2 g in sodium chloride 0.9 % 100 mL IVPB  Status:  Discontinued     2 g 200 mL/hr over 30 Minutes Intravenous Every 24 hours 02/28/20 2005 03/04/20 0959   02/28/20 1915  cefTRIAXone (ROCEPHIN) 1 g in sodium chloride 0.9 % 100 mL IVPB    Note to Pharmacy: Dose adjusted for weight.   1 g 200 mL/hr over 30 Minutes Intravenous  Once 02/28/20 1904 02/28/20 2130   02/28/20 1600  cefTRIAXone (ROCEPHIN) 1 g in sodium chloride 0.9 % 100 mL IVPB     1 g 200 mL/hr over 30 Minutes Intravenous  Once 02/28/20 1511 02/28/20 1627        Objective:   Vitals:    03/03/20 2112 03/04/20 0515 03/04/20 0733 03/04/20 1528  BP: (!) 146/80 137/89  133/85  Pulse: 64 69  67  Resp: 20 20  16   Temp: 99.2 F (37.3 C) 98.2 F (36.8 C)  97.6 F (36.4 C)  TempSrc: Oral Oral  Oral  SpO2: 97% 94% 94% 96%  Weight:  99.9 kg    Height:        Wt Readings from Last 3 Encounters:  03/04/20 99.9 kg  02/04/20 104.3 kg  01/28/20 106.4 kg     Intake/Output Summary (Last 24 hours) at 03/04/2020 1603 Last data filed at 03/04/2020 0904 Gross per 24 hour  Intake 600 ml  Output 900 ml  Net -300 ml   Physical Exam Gen:-More awake and interactive, no acute distress HEENT:- [place].AT, No sclera icterus Neck-Supple Neck,No JVD,.  Lungs-  CTAB , fair symmetrical air movement CV- S1, S2 normal, regular  Abd-  +ve B.Sounds, Abd Soft, No tenderness, no CVA area tenderness Extremity/Skin:- No  edema, pedal pulses present  Psych-affect is appropriate, oriented x3 Neuro-generalized weakness, no new focal deficits, no tremors   Data Review:   Micro Results Recent Results (from the past 240 hour(s))  Urine Culture     Status: Abnormal   Collection Time: 02/26/20 10:39 AM   Specimen: Urine  Result Value Ref Range Status   MICRO NUMBER: [id number]  Final   SPECIMEN QUALITY: Adequate  Final   Sample Source URINE  Final   STATUS: FINAL  Final   ISOLATE 1: Citrobacter koseri (A)  Final    Comment: Greater than 100,000 CFU/mL of Citrobacter koseri      Susceptibility   Citrobacter koseri - URINE CULTURE, REFLEX    AMOX/CLAVULANIC <=2 Sensitive     CEFAZOLIN* <=4 Not Reportable      * For infections other than uncomplicated UTIcaused by E. coli, K. pneumoniae or P. mirabilis:Cefazolin is resistant if MIC > or = 8 mcg/mL.(Distinguishing susceptible versus intermediatefor isolates with MIC < or = 4 mcg/mL requiresadditional testing.)For uncomplicated UTI caused by E. coli,K. pneumoniae or P. mirabilis: Cefazolin issusceptible if MIC <32 mcg/mL and predictssusceptible to the oral  agents cefaclor, cefdinir,cefpodoxime, cefprozil, cefuroxime, cephalexinand loracarbef.    CEFEPIME <=1 Sensitive     CEFTRIAXONE <=1 Sensitive     CIPROFLOXACIN <=0.25 Sensitive     LEVOFLOXACIN <=0.12 Sensitive     ERTAPENEM <=0.5 Sensitive     GENTAMICIN <=1 Sensitive     IMIPENEM <=0.25 Sensitive     NITROFURANTOIN 32 Sensitive     PIP/TAZO <=4 Sensitive     TOBRAMYCIN <=  1 Sensitive     TRIMETH/SULFA* <=20 Sensitive      * For infections other than uncomplicated UTIcaused by E. coli, K. pneumoniae or P. mirabilis:Cefazolin is resistant if MIC > or = 8 mcg/mL.(Distinguishing susceptible versus intermediatefor isolates with MIC < or = 4 mcg/mL requiresadditional testing.)For uncomplicated UTI caused by E. coli,K. pneumoniae or P. mirabilis: Cefazolin issusceptible if MIC <32 mcg/mL and predictssusceptible to the oral agents cefaclor, cefdinir,cefpodoxime, cefprozil, cefuroxime, cephalexinand loracarbef.Legend:S = Susceptible  I = IntermediateR = Resistant  NS = Not susceptible* = Not tested  NR = Not reported**NN = See antimicrobic comments  Blood Culture (routine x 2)     Status: None   Collection Time: 02/28/20  3:23 PM   Specimen: Right Antecubital; Blood  Result Value Ref Range Status   Specimen Description RIGHT ANTECUBITAL  Final   Special Requests   Final    BOTTLES DRAWN AEROBIC AND ANAEROBIC Blood Culture adequate volume   Culture   Final    NO GROWTH 5 DAYS Performed at [facility], [address] [place], [place] [address]    Report Status 03/04/2020 FINAL  Final  Blood Culture (routine x 2)     Status: None   Collection Time: 02/28/20  3:29 PM   Specimen: Left Antecubital; Blood  Result Value Ref Range Status   Specimen Description LEFT ANTECUBITAL  Final   Special Requests   Final    BOTTLES DRAWN AEROBIC AND ANAEROBIC Blood Culture adequate volume   Culture   Final    NO GROWTH 5 DAYS Performed at [facility], [address], [place], [place] [address]    Report  Status 03/04/2020 FINAL  Final  SARS Coronavirus 2 by RT PCR (hospital order, performed in [facility] hospital lab) Nasopharyngeal Nasopharyngeal Swab     Status: None   Collection Time: 02/28/20  4:08 PM   Specimen: Nasopharyngeal Swab  Result Value Ref Range Status   SARS Coronavirus 2 NEGATIVE NEGATIVE Final    Comment: (NOTE) SARS-CoV-2 target nucleic acids are NOT DETECTED. The SARS-CoV-2 RNA is generally detectable in upper and lower respiratory specimens during the acute phase of infection. The lowest concentration of SARS-CoV-2 viral copies this assay can detect is 250 copies / mL. A negative result does not preclude SARS-CoV-2 infection and should not be used as the sole basis for treatment or other patient management decisions.  A negative result may occur with improper specimen collection / handling, submission of specimen other than nasopharyngeal swab, presence of viral mutation(s) within the areas targeted by this assay, and inadequate number of viral copies (<250 copies / mL). A negative result must be combined with clinical observations, patient history, and epidemiological information. Fact Sheet for Patients:   StrictlyIdeas.no Fact Sheet for Healthcare Providers: BankingDealers.co.za This test is not yet approved or cleared  by the [place] FDA and has been authorized for detection and/or diagnosis of SARS-CoV-2 by FDA under an Emergency Use Authorization (EUA).  This EUA will remain in effect (meaning this test can be used) for the duration of the COVID-19 declaration under Section 564(b)(1) of the Act, 21 U.S.C. section 360bbb-3(b)(1), unless the authorization is terminated or revoked sooner. Performed at [facility], [address], [place],  [address]   Urine culture     Status: Abnormal   Collection Time: 02/28/20  5:40 PM   Specimen: In/Out Cath Urine  Result Value Ref Range Status   Specimen  Description   Final    IN/OUT  CATH URINE Performed at [facility], [address], [place], [place] [address]    Special Requests   Final    NONE Performed at [facility], [address], [place], [place] [address]    Culture 20,000 COLONIES/mL ENTEROCOCCUS FAECALIS (A)  Final   Report Status 03/01/2020 FINAL  Final   Organism ID, Bacteria ENTEROCOCCUS FAECALIS (A)  Final      Susceptibility   Enterococcus faecalis - MIC*    AMPICILLIN <=2 SENSITIVE Sensitive     NITROFURANTOIN 64 INTERMEDIATE Intermediate     VANCOMYCIN 2 SENSITIVE Sensitive     * 20,000 COLONIES/mL ENTEROCOCCUS FAECALIS    Radiology Reports CT ABDOMEN PELVIS WO CONTRAST  Result Date: 02/21/2020 CLINICAL DATA:  Diarrhea with weight loss.  Diabetes. EXAM: CT ABDOMEN AND PELVIS WITHOUT CONTRAST TECHNIQUE: Multidetector CT imaging of the abdomen and pelvis was performed following the standard protocol without IV contrast. COMPARISON:  None. FINDINGS: Lower chest: Lung bases demonstrate linear scarring over the left lower lobe. There is a 5 x 11 mm nodule over the right lower lobe as well as a 4 mm subpleural nodule over the right middle lobe. Hepatobiliary: Suggestion of several indistinct hypodense liver lesions with the best defined over the posterior right lobe measuring 3.1 cm. Possible 1.6 cm hyperdense nodule over the dome of the medial segment left lobe of the liver. Gallbladder and biliary tree are unremarkable. Pancreas: Prominence with minimal associated calcification over the body of the pancreas. Spleen: Normal. Adrenals/Urinary Tract: Adrenal glands are normal. Kidneys are normal in size without hydronephrosis or nephrolithiasis. No focal renal mass. Ureters are normal. Single focus of air over the anterior bladder. Stomach/Bowel: Stomach and small bowel are normal. Appendix is normal. Colon is unremarkable. Vascular/Lymphatic: Minimal calcified plaque over the abdominal aorta which is normal in caliber. Few small  nonspecific lymph nodes over the upper abdomen over the gastrohepatic ligament and Peri pancreatic region. Reproductive: Uterus is right of midline.  Ovaries are normal. Other: No free fluid or focal inflammatory change. Musculoskeletal: Mild degenerative changes of the spine and hips. IMPRESSION: 1.  No acute findings in the abdomen/pelvis. 2. Focal prominence with associated calcification over the body of the pancreas. Few small nonspecific adjacent lymph nodes. Recommend further evaluation with MRI. 3. A few indistinct hypodensities over the liver with the best defined over the posterior right lobe measuring 3.1 cm. Findings are indeterminate as metastatic disease is possible. This could also be further evaluated with MRI. 4. Two nodules over the right mid to lower lung with the larger over the lateral right lower lobe measuring 8 mm. Recommend chest CT with contrast for complete evaluation of the lungs and thorax. 5.  Aortic Atherosclerosis (ICD10-I70.0). 6. Tiny focus of air over the anterior bladder which may be due to recent instrumentation although can be seen due to infection or enteric fistula. Electronically Signed   By: Marin Olp M.D.   On: 02/21/2020 16:38   CT CHEST WO CONTRAST  Result Date: 02/28/2020 CLINICAL DATA:  Respiratory failure.  Pulmonary nodules EXAM: CT CHEST WITHOUT CONTRAST TECHNIQUE: Multidetector CT imaging of the chest was performed following the standard protocol without IV contrast. COMPARISON:  CT abdomen 02/21/2020 FINDINGS: Cardiovascular: Heart is normal size. Aorta is normal caliber. Aortic atherosclerosis. Mediastinum/Nodes: No mediastinal, hilar, or axillary adenopathy. Lungs/Pleura: 13 mm spiculated nodule in the left upper lobe on image 28. Right lower lobe nodule as seen on prior abdominal CT measures 8 mm on image 64. Small subpleural nodule  in the right middle lobe measures 4 mm. Mild elevation of the right hemidiaphragm. Bibasilar atelectasis or scarring. No  effusions. Upper Abdomen: Ill-defined hypodensities again noted in the liver, best seen in the posterior right hepatic lobe measuring approximately 3.3 cm, similar to prior abdominal CT. Musculoskeletal: Chest wall soft tissues are unremarkable. No acute bony abnormality. IMPRESSION: Previously seen nodules in the right lower lobe and right middle lobe as seen on prior abdominal CT. There is also a spiculated 13 mm nodule in the left upper lobe. Appearance is concerning for possible primary lung cancer. Consider further evaluation with PET CT. Ill-defined low-density lesions in the liver as seen on prior abdominal CT. Cannot exclude metastatic disease. Aortic Atherosclerosis (ICD10-I70.0). Electronically Signed   By: Rolm Baptise M.D.   On: 02/28/2020 20:55   MR ABDOMEN W WO CONTRAST  Result Date: 03/04/2020 CLINICAL DATA:  EVALUATE LIVER LESIONS. EXAM: MRI ABDOMEN WITHOUT AND WITH CONTRAST TECHNIQUE: Multiplanar multisequence MR imaging of the abdomen was performed both before and after the administration of intravenous contrast. CONTRAST:  59mL GADAVIST GADOBUTROL 1 MMOL/ML IV SOLN COMPARISON:  02/28/2020 FINDINGS: Lower chest: Subpleural consolidation noted in the right lung base. Hepatobiliary: There are multifocal rim enhancing lesions involving both lobes of liver suspicious for metastatic disease. -Index lesion within segment 7/6 measures 5.2 by 3.2 cm, image 22/19. -Index lesion in segment 2 of left lobe of liver measures 5.9 x 4.3 cm, image 15/9. -Segment 3 index lesion measures 2.7 by 2.5 cm lesion, image 24/19. -Segment 8/4 index lesion measures 4.0 x 3.8 cm, image 11/19. Tiny stones versus sludge noted within the dependent portion of the gallbladder. No significant gallbladder wall thickening or inflammation. No bile duct dilatation. Pancreas: There is a hypoenhancing mass involving the body and tail of pancreas which measures approximately 9.0 x 3.7 cm, image 34/17. This is highly concerning for  primary pancreatic adenocarcinoma. Tumor encasement of the superior mesenteric artery is identified, image 38/21. The celiac artery is not well seen and may also be encased by tumor. Difficult to exclude involvement of the portal venous confluence due to motion artifact. Spleen:  Within normal limits in size and appearance. Adrenals/Urinary Tract: Normal adrenal glands. No kidney mass or hydronephrosis. Stomach/Bowel: Visualized portions within the abdomen are unremarkable. Vascular/Lymphatic: Normal appearance of the abdominal aorta. Within the limitations of motion artifact no adenopathy identified within the upper abdomen. Other:  No free fluid or fluid collections. Musculoskeletal: No suspicious bone lesions identified. IMPRESSION: 1. Large hypoenhancing mass involving the body and tail of pancreas is highly concerning for primary pancreatic adenocarcinoma. There is evidence for encasement of the superior mesenteric artery and possible involvement of the celiac trunk and portal venous confluence. 2. Multifocal rim enhancing lesions involving both lobes of liver suspicious for metastatic disease. Electronically Signed   By: Kerby Moors M.D.   On: 03/04/2020 14:15   MR 3D Recon At Scanner  Result Date: 03/04/2020 CLINICAL DATA:  EVALUATE LIVER LESIONS. EXAM: MRI ABDOMEN WITHOUT AND WITH CONTRAST TECHNIQUE: Multiplanar multisequence MR imaging of the abdomen was performed both before and after the administration of intravenous contrast. CONTRAST:  90mL GADAVIST GADOBUTROL 1 MMOL/ML IV SOLN COMPARISON:  02/28/2020 FINDINGS: Lower chest: Subpleural consolidation noted in the right lung base. Hepatobiliary: There are multifocal rim enhancing lesions involving both lobes of liver suspicious for metastatic disease. -Index lesion within segment 7/6 measures 5.2 by 3.2 cm, image 22/19. -Index lesion in segment 2 of left lobe of liver measures 5.9 x 4.3  cm, image 15/9. -Segment 3 index lesion measures 2.7 by 2.5 cm  lesion, image 24/19. -Segment 8/4 index lesion measures 4.0 x 3.8 cm, image 11/19. Tiny stones versus sludge noted within the dependent portion of the gallbladder. No significant gallbladder wall thickening or inflammation. No bile duct dilatation. Pancreas: There is a hypoenhancing mass involving the body and tail of pancreas which measures approximately 9.0 x 3.7 cm, image 34/17. This is highly concerning for primary pancreatic adenocarcinoma. Tumor encasement of the superior mesenteric artery is identified, image 38/21. The celiac artery is not well seen and may also be encased by tumor. Difficult to exclude involvement of the portal venous confluence due to motion artifact. Spleen:  Within normal limits in size and appearance. Adrenals/Urinary Tract: Normal adrenal glands. No kidney mass or hydronephrosis. Stomach/Bowel: Visualized portions within the abdomen are unremarkable. Vascular/Lymphatic: Normal appearance of the abdominal aorta. Within the limitations of motion artifact no adenopathy identified within the upper abdomen. Other:  No free fluid or fluid collections. Musculoskeletal: No suspicious bone lesions identified. IMPRESSION: 1. Large hypoenhancing mass involving the body and tail of pancreas is highly concerning for primary pancreatic adenocarcinoma. There is evidence for encasement of the superior mesenteric artery and possible involvement of the celiac trunk and portal venous confluence. 2. Multifocal rim enhancing lesions involving both lobes of liver suspicious for metastatic disease. Electronically Signed   By: Kerby Moors M.D.   On: 03/04/2020 14:15   DG Chest Port 1 View  Result Date: 02/28/2020 CLINICAL DATA:  Shortness of breath. EXAM: PORTABLE CHEST 1 VIEW COMPARISON:  Chest x-ray dated July 19, 2018. FINDINGS: The heart size and mediastinal contours are within normal limits. Pulmonary vascular congestion with chronically coarse interstitial markings. New 1.5 cm nodule in the  left upper lobe. No focal consolidation, pleural effusion, or pneumothorax. No acute osseous abnormality. IMPRESSION: 1. New 1.5 cm nodule in the left upper lobe. Chest CT is recommended for further evaluation. 2. Mild pulmonary vascular congestion. Electronically Signed   By: Titus Dubin M.D.   On: 02/28/2020 15:49   ECHOCARDIOGRAM COMPLETE  Result Date: 02/29/2020    ECHOCARDIOGRAM REPORT   Patient Name:   CLEOTILDE SPADACCINI Date of Exam: 02/29/2020 Medical Rec #:  [id number]           Height:       64.0 in Accession #:    [id number]          Weight:       223.2 lb Date of Birth:  [birth year]           BSA:          2.050 m Patient Age:    41 years            BP:           105/74 mmHg Patient Gender: F                   HR:           73 bpm. Exam Location:  [facility] Procedure: 2D Echo Indications:    Congestive Heart Failure 428.0 / I50.9  History:        Patient has prior history of Echocardiogram examinations, most                 recent 06/11/2013. CHF, Stroke; Risk Factors:Hypertension,                 Dyslipidemia, Diabetes and Former Smoker. LVH.  Sonographer:    Leavy Cella RDCS (AE) Referring Phys: [place]  1. Left ventricular ejection fraction, by estimation, is 65 to 70%. The left ventricle has normal function. The left ventricle has no regional wall motion abnormalities. There is mild left ventricular hypertrophy. Left ventricular diastolic parameters are consistent with Grade I diastolic dysfunction (impaired relaxation).  2. Right ventricular systolic function is low normal. The right ventricular size is mildly enlarged. There is mildly elevated pulmonary artery systolic pressure.  3. The mitral valve is normal in structure. No evidence of mitral valve regurgitation. No evidence of mitral stenosis.  4. The aortic valve is tricuspid. Aortic valve regurgitation is not visualized. No aortic stenosis is present. FINDINGS  Left Ventricle: Left ventricular ejection  fraction, by estimation, is 65 to 70%. The left ventricle has normal function. The left ventricle has no regional wall motion abnormalities. The left ventricular internal cavity size was normal in size. There is  mild left ventricular hypertrophy. Left ventricular diastolic parameters are consistent with Grade I diastolic dysfunction (impaired relaxation). Normal left ventricular filling pressure. Right Ventricle: The right ventricular size is mildly enlarged. Right vetricular wall thickness was not assessed. Right ventricular systolic function is low normal. There is mildly elevated pulmonary artery systolic pressure. The tricuspid regurgitant velocity is 2.86 m/s, and with an assumed right atrial pressure of 10 mmHg, the estimated right ventricular systolic pressure is 67.8 mmHg. Left Atrium: Left atrial size was normal in size. Right Atrium: Right atrial size was not well visualized. Pericardium: There is no evidence of pericardial effusion. Mitral Valve: The mitral valve is normal in structure. No evidence of mitral valve regurgitation. No evidence of mitral valve stenosis. Tricuspid Valve: The tricuspid valve is normal in structure. Tricuspid valve regurgitation is mild . No evidence of tricuspid stenosis. Aortic Valve: The aortic valve is tricuspid. Aortic valve regurgitation is not visualized. No aortic stenosis is present. Aortic valve mean gradient measures 4.0 mmHg. Aortic valve peak gradient measures 7.4 mmHg. Aortic valve area, by VTI measures 2.61 cm. Pulmonic Valve: The pulmonic valve was not well visualized. Pulmonic valve regurgitation is not visualized. No evidence of pulmonic stenosis. Aorta: The aortic root is normal in size and structure. Pulmonary Artery: Indeterminant PASP, IVC poorly visualized. Venous: The inferior vena cava was not well visualized. IAS/Shunts: The interatrial septum was not well visualized.  LEFT VENTRICLE PLAX 2D LVIDd:         4.15 cm  Diastology LVIDs:         1.76 cm   LV e' lateral:   8.38 cm/s LV PW:         1.12 cm  LV E/e' lateral: 4.3 LV IVS:        1.17 cm  LV e' medial:    5.66 cm/s LVOT diam:     2.00 cm  LV E/e' medial:  6.4 LV SV:         55 LV SV Index:   27 LVOT Area:     3.14 cm  RIGHT VENTRICLE RV S prime:     8.05 cm/s TAPSE (M-mode): 1.6 cm LEFT ATRIUM           Index       RIGHT ATRIUM           Index LA diam:      2.10 cm 1.02 cm/m  RA Area:     13.30 cm LA Vol (A2C): 21.8 ml 10.64 ml/m RA Volume:   36.20  ml  17.66 ml/m LA Vol (A4C): 32.1 ml 15.66 ml/m  AORTIC VALVE AV Area (Vmax):    2.37 cm AV Area (Vmean):   2.25 cm AV Area (VTI):     2.61 cm AV Vmax:           135.74 cm/s AV Vmean:          96.225 cm/s AV VTI:            0.210 m AV Peak Grad:      7.4 mmHg AV Mean Grad:      4.0 mmHg LVOT Vmax:         102.35 cm/s LVOT Vmean:        68.803 cm/s LVOT VTI:          0.174 m LVOT/AV VTI ratio: 0.83  AORTA Ao Root diam: 3.00 cm MITRAL VALVE               TRICUSPID VALVE MV Area (PHT): 1.98 cm    TR Peak grad:   32.7 mmHg MV Decel Time: 384 msec    TR Vmax:        286.00 cm/s MV E velocity: 36.40 cm/s MV A velocity: 75.20 cm/s  SHUNTS MV E/A ratio:  0.48        Systemic VTI:  0.17 m                            Systemic Diam: 2.00 cm Carlyle Dolly MD Electronically signed by Carlyle Dolly MD Signature Date/Time: 02/29/2020/10:45:25 AM    Final      CBC Recent Labs  Lab 02/28/20 1522 03/02/20 0405 03/03/20 0506  WBC 9.5 10.8* 10.3  HGB 12.5 11.4* 11.7*  HCT 40.6 37.3 37.8  PLT 243 181 180  MCV 85.5 86.3 87.1  MCH 26.3 26.4 27.0  MCHC 30.8 30.6 31.0  RDW 16.5* 16.7* 16.8*  LYMPHSABS 2.0  --   --   MONOABS 0.9  --   --   EOSABS 0.1  --   --   BASOSABS 0.1  --   --     Chemistries  Recent Labs  Lab 02/28/20 1522 02/29/20 0551 03/02/20 0405 03/03/20 0506  NA 139 141 144 145  K 3.7 4.1 4.0 4.1  CL 110 112* 117* 117*  CO2 17* 20* 19* 20*  GLUCOSE 47* 271* 185* 181*  BUN 60* 49* 30* 26*  CREATININE 3.60* 2.86* 1.96* 1.75*    CALCIUM 9.3 8.2* 8.8* 9.0  AST 15  --  14* 15  ALT 12  --  13 12  ALKPHOS 84  --  70 66  BILITOT 0.5  --  0.4 0.2*   ------------------------------------------------------------------------------------------------------------------ No results for input(s): CHOL, HDL, LDLCALC, TRIG, CHOLHDL, LDLDIRECT in the last 72 hours.  Lab Results  Component Value Date   HGBA1C 7.4 (A) 01/28/2020   ------------------------------------------------------------------------------------------------------------------ No results for input(s): TSH, T4TOTAL, T3FREE, THYROIDAB in the last 72 hours.  Invalid input(s): FREET3 ------------------------------------------------------------------------------------------------------------------ No results for input(s): VITAMINB12, FOLATE, FERRITIN, TIBC, IRON, RETICCTPCT in the last 72 hours.  Coagulation profile Recent Labs  Lab 02/28/20 1522  INR 1.3*    No results for input(s): DDIMER in the last 72 hours.  Cardiac Enzymes No results for input(s): CKMB, TROPONINI, MYOGLOBIN in the last 168 hours.  Invalid input(s): CK ------------------------------------------------------------------------------------------------------------------    Component Value Date/Time   BNP 356.0 (H) 02/28/2020 1522   Roxan Hockey M.D on 03/04/2020 at  4:03 PM  Go to [email or web address] - for contact info  [facility] - Office  [phone]

## 2020-03-04 NOTE — Progress Notes (Signed)
   MRCP Abdomen IMPRESSION: 1) Large hypoenhancing mass involving the body and tail of pancreas is highly concerning for primary pancreatic adenocarcinoma. There is evidence for encasement of the superior mesenteric artery and possible involvement of the celiac trunk and portal venous confluence.  2) Multifocal rim enhancing lesions involving both lobes of liver suspicious for metastatic disease.   --Presumed metastatic stage IV pancreatic cancer with liver mets Discussed with Pt with RN Donavan Foil,  Spoke with Pt's sister face to face --- Ms Kathryn Cook [phone] and I called and spoke with ALF/Group home supervisor [name]----[phone]  Roxan Hockey, MD

## 2020-03-05 DIAGNOSIS — Z66 Do not resuscitate: Secondary | ICD-10-CM

## 2020-03-05 DIAGNOSIS — Z515 Encounter for palliative care: Secondary | ICD-10-CM

## 2020-03-05 DIAGNOSIS — Z7189 Other specified counseling: Secondary | ICD-10-CM

## 2020-03-05 LAB — BASIC METABOLIC PANEL
Anion gap: 9 (ref 5–15)
BUN: 19 mg/dL (ref 8–23)
CO2: 20 mmol/L — ABNORMAL LOW (ref 22–32)
Calcium: 9.1 mg/dL (ref 8.9–10.3)
Chloride: 113 mmol/L — ABNORMAL HIGH (ref 98–111)
Creatinine, Ser: 1.51 mg/dL — ABNORMAL HIGH (ref 0.44–1.00)
GFR calc Af Amer: 41 mL/min — ABNORMAL LOW (ref >60)
GFR calc non Af Amer: 35 mL/min — ABNORMAL LOW (ref >60)
Glucose, Bld: 65 mg/dL — ABNORMAL LOW (ref 70–99)
Potassium: 3.3 mmol/L — ABNORMAL LOW (ref 3.5–5.1)
Sodium: 142 mmol/L (ref 135–145)

## 2020-03-05 LAB — CBC
HCT: 37.1 % (ref 36.0–46.0)
Hemoglobin: 11.5 g/dL — ABNORMAL LOW (ref 12.0–15.0)
MCH: 26.3 pg (ref 26.0–34.0)
MCHC: 31 g/dL (ref 30.0–36.0)
MCV: 84.9 fL (ref 80.0–100.0)
Platelets: 189 10*3/uL (ref 150–400)
RBC: 4.37 MIL/uL (ref 3.87–5.11)
RDW: 16.4 % — ABNORMAL HIGH (ref 11.5–15.5)
WBC: 7.9 10*3/uL (ref 4.0–10.5)
nRBC: 0 % (ref 0.0–0.2)

## 2020-03-05 LAB — GLUCOSE, CAPILLARY
Glucose-Capillary: 48 mg/dL — ABNORMAL LOW (ref 70–99)
Glucose-Capillary: 90 mg/dL (ref 70–99)
Glucose-Capillary: 95 mg/dL (ref 70–99)
Glucose-Capillary: 105 mg/dL — ABNORMAL HIGH (ref 70–99)
Glucose-Capillary: 95 mg/dL (ref 70–99)

## 2020-03-05 LAB — CA 125: Cancer Antigen (CA) 125: 58.5 U/mL — ABNORMAL HIGH (ref 0.0–38.1)

## 2020-03-05 LAB — CANCER ANTIGEN 19-9: CA 19-9: <2 U/mL (ref 0–35)

## 2020-03-05 LAB — CEA: CEA: 161 ng/mL — ABNORMAL HIGH (ref 0.0–4.7)

## 2020-03-05 MED ORDER — LOPERAMIDE HCL 2 MG PO CAPS: 2.0000 mg | ORAL_CAPSULE | Freq: Two times a day (BID) | ORAL | Status: DC | PRN

## 2020-03-05 MED ORDER — GLYCOPYRROLATE 1 MG PO TABS
1.0000 mg | ORAL_TABLET | Freq: Two times a day (BID) | ORAL | Status: DC
  Administered 2020-03-05 – 2020-03-06 (×2): 1 mg via ORAL
  Filled 2020-03-05 (×2): qty 1

## 2020-03-05 MED ORDER — LOPERAMIDE HCL 2 MG PO CAPS
2.0000 mg | ORAL_CAPSULE | Freq: Two times a day (BID) | ORAL | Status: DC
  Administered 2020-03-05 – 2020-03-06 (×3): 2 mg via ORAL
  Filled 2020-03-05 (×3): qty 1

## 2020-03-05 MED ORDER — GLYCOPYRROLATE 1 MG PO TABS: 1.0000 mg | ORAL_TABLET | Freq: Three times a day (TID) | ORAL | Status: DC | PRN

## 2020-03-05 NOTE — Care Management Important Message (Signed)
Important Message  Patient Details  Name: Kathryn Cook MRN: [id number] Date of Birth: Jul 15, 1951   Medicare Important Message Given:  Yes     Tommy Medal 03/05/2020, 12:55 PM

## 2020-03-05 NOTE — Progress Notes (Signed)
Patient Demographics:    Kathryn Cook, is a 69 y.o. female, DOB - September 19, 1951, DHR:[id number]  Admit date - 02/28/2020   Admitting Physician Ejiroghene Arlyce Dice, MD  Outpatient Primary MD for the patient is [name], MD  LOS - 6   Chief Complaint  Patient presents with   Urinary Tract Infection        Subjective:    [facility] today has no fevers, no emesis,  No chest pain,   -Profuse large-volume diarrhea persist  Assessment  & Plan :    Principal Problem:   Pancreatic mass/-Presumed metastatic stage IV pancreatic cancer with liver mets Active Problems:   HTN (hypertension)   Kidney disease, chronic, stage III (GFR 30-59 ml/min)   Diastolic congestive heart failure, NYHA class 1 (HCC)   H/O: CVA (cerebrovascular accident)   Dementia ([place])   Type II diabetes mellitus with neurological manifestations ([place])   Essential hypertension, benign   Diarrhea   AKI (acute kidney injury) ([place])   DNR (do not resuscitate)   Palliative care by specialist  Brief Summary:- 69 y.o. female with medical history significant for diastolic CHF, CKD, diabetes mellitus, dementia, CVA, hypertension admitted on 02/28/20 with AKI in the setting of diarrhea   -MRCP Abdomen IMPRESSION: 1) Large hypoenhancing mass involving the body and tail of pancreas is highly concerning for primary pancreatic adenocarcinoma. There is evidence for encasement of the superior mesenteric artery and possible involvement of the celiac trunk and portal venous confluence.  2) Multifocal rim enhancing lesions involving both lobes of liver suspicious for metastatic disease.   --Presumed metastatic stage IV pancreatic cancer with liver mets Discussed with Pt with RN Donavan Foil,  Spoke with Pt's sister face to face --- Kathryn Cook [phone] and I called and spoke with ALF/Group home supervisor Kathryn Larwance Sachs  Simpson----[phone]  A/p 1)AKI----acute kidney injury on CKD stage - IIIB   creatinine on admission= 3.6, baseline creatinine = around 2    , creatinine is now= 1.75, renally adjust medications, avoid nephrotoxic agents / dehydration  / hypotension  -Worsening renal function related to dehydration in the setting of persistent diarrhea and GI losses -Patient at risk for severe dehydration electrolyte abnormalities given persistent diarrhea  2)HFpEF--Echo with EF of 65 to 70% and grade 1 diastolic dysfunction--- be judicious with IV fluids  3) chronic diarrhea--43-month duration--extensive outpatient GI work-up, C. difficile and GI pathogen negative and normal celiac serologies as well as normal TSH as outpatient (May 2021) -Inpatient GI consult appreciated ---c/n Creon 72,000 units with meals, 36,000 units with snacks Imodium prn: may need to have scheduled but will see how she responds to increased Creon dosage MRI abdomen --with --Presumed metastatic stage IV pancreatic cancer with liver mets Spoke with Pt's sister (Kathryn Cook) and patient's nephew face to face ---  [phone] and I called and spoke with ALF/Group home supervisor Kathryn Larwance Sachs Simpson----[phone]  4)Citrobacter koseri and Enterococcus faecalis UTI--okay to stop IV Rocephin, -Continue ampicillin -starting 03/04/2020  5)Acute hypoxic respiratory failure----CT chest without contrast suggest possible primary lung cancer with a spiculated nodule in the left upper lobe -Outpatient PET scans recommended  6)H/o Vascular dementia and prior CVA-aspirin, statin, Aricept and Celexa  7)Recent abnormal CT abdomen/pelvis findings- 02/21/20-  A few indistinct hypodensities over the liver with the best defined over the posterior right lobe measuring 3.1 cm. Findings are indeterminate as metastatic disease is possible. This could also be further evaluated with MRI. -Being followed by gastroenterology,  -Abdominal MRI -with  -Presumed metastatic stage IV pancreatic cancer with liver mets Discussed with Pt with RN Donavan Foil,  Spoke with Pt's sister face to face --- Kathryn Cook [phone] and I called and spoke with ALF/Group home supervisor Kathryn Larwance Sachs [place]----[phone]  8)HTN--- hypotension resolved with IV fluids, Norvasc Coreg Lasix and losartan currently on hold  9)DM2-A1c 7.4 reflecting uncontrolled DM PTA -Hold   Tradjenta -c/n  Lantus at 20 units nightly, patient was on 50 units PTA Use Novolog/Humalog Sliding scale insulin with Accu-Cheks/Fingersticks as ordered   9)Social/Ethics--stage IV metastatic pancreatic cancer with liver mass and possible lung cancer as well visualized mets to the lungs in addition -Patient sister leaning towards possible hospice for comfort care--final decision to be made -Palliative care consult appreciated  Disposition/Need for in-Hospital Stay- patient unable to be discharged at this time due to --acute kidney injury and dehydration requiring continuous IV fluids and close monitoring due to risk for volume overload and CHF exacerbation -Patient with persistent profuse large-volume diarrhea at risk for recurrent AKI and electrolyte of normalities if work-up not completed -Abdominal MRI- with --Presumed metastatic stage IV pancreatic cancer with liver mets --Awaiting family decision on possible hospice involvement  Status is: Inpatient  Remains inpatient appropriate because:Altered mental status, IV treatments appropriate due to intensity of illness or inability to take PO and Inpatient level of care appropriate due to severity of illness   Dispo: The patient is from: Home              Anticipated d/c is to: Group home              Anticipated d/c date is: 1              Patient currently is not medically stable to d/c.  Barriers: Not Clinically Stable- acute kidney injury and dehydration requiring continuous IV fluids and close monitoring due to risk for  volume overload and CHF exacerbation -Patient with persistent profuse large-volume diarrhea at risk for recurrent AKI and electrolyte of normalities if work-up not completed -Abdominal MRI with stage IV pancreatic cancer -Awaiting family decision on possible hospice involvement --Patient sister leaning towards possible hospice for comfort care--final decision to be made -Palliative care consult appreciated  Code Status : Full  Family Communication:   Sister and group home administrator  DVT Prophylaxis  :   - Heparin - SCDs   Lab Results  Component Value Date   PLT 189 03/05/2020    Inpatient Medications  Scheduled Meds:  aspirin EC  325 mg Oral Daily   citalopram  40 mg Oral Daily   donepezil  10 mg Oral QHS   glycopyrrolate  1 mg Oral BID   heparin  5,000 Units Subcutaneous Q8H   insulin aspart  0-5 Units Subcutaneous QHS   insulin aspart  0-9 Units Subcutaneous TID WC   insulin glargine  20 Units Subcutaneous QHS   lipase/protease/amylase  36,000 Units Oral With snacks   lipase/protease/amylase  72,000 Units Oral TID WC   loperamide  2 mg Oral BID   saccharomyces boulardii  250 mg Oral BID   umeclidinium bromide  1 puff Inhalation Daily   Continuous Infusions:  sodium chloride 50 mL/hr at 03/05/20 1519   ampicillin (OMNIPEN)  IV 1 g (03/05/20 1645)   PRN Meds:.acetaminophen **OR** acetaminophen, ipratropium-albuterol, loperamide, ondansetron **OR** ondansetron (ZOFRAN) IV, polyethylene glycol    Anti-infectives (From admission, onward)   Start     Dose/Rate Route Frequency Ordered Stop   03/04/20 1200  ampicillin (OMNIPEN) 1 g in sodium chloride 0.9 % 100 mL IVPB     1 g 300 mL/hr over 20 Minutes Intravenous Every 6 hours 03/04/20 0959     02/29/20 1800  cefTRIAXone (ROCEPHIN) 2 g in sodium chloride 0.9 % 100 mL IVPB  Status:  Discontinued     2 g 200 mL/hr over 30 Minutes Intravenous Every 24 hours 02/28/20 2005 03/04/20 0959   02/28/20 1915   cefTRIAXone (ROCEPHIN) 1 g in sodium chloride 0.9 % 100 mL IVPB    Note to Pharmacy: Dose adjusted for weight.   1 g 200 mL/hr over 30 Minutes Intravenous  Once 02/28/20 1904 02/28/20 2130   02/28/20 1600  cefTRIAXone (ROCEPHIN) 1 g in sodium chloride 0.9 % 100 mL IVPB     1 g 200 mL/hr over 30 Minutes Intravenous  Once 02/28/20 1511 02/28/20 1627        Objective:   Vitals:   03/05/20 0743 03/05/20 1522 03/05/20 1957 03/05/20 2047  BP:  120/75  129/77  Pulse:  70  72  Resp:  18  20  Temp:  98.7 F (37.1 C)  98.9 F (37.2 C)  TempSrc:  Oral  Oral  SpO2: 93% 94% 95% 93%  Weight:      Height:        Wt Readings from Last 3 Encounters:  03/04/20 99.9 kg  02/04/20 104.3 kg  01/28/20 106.4 kg     Intake/Output Summary (Last 24 hours) at 03/05/2020 2120 Last data filed at 03/05/2020 1534 Gross per 24 hour  Intake 2707.17 ml  Output --  Net 2707.17 ml   Physical Exam Gen:-More awake and interactive, no acute distress HEENT:- [place].AT, No sclera icterus Neck-Supple Neck,No JVD,.  Lungs-  CTAB , fair symmetrical air movement CV- S1, S2 normal, regular  Abd-  +ve B.Sounds, Abd Soft, No tenderness, no CVA area tenderness Extremity/Skin:- No  edema, pedal pulses present  Psych-affect is appropriate, oriented x3 Neuro-generalized weakness, no new focal deficits, no tremors   Data Review:   Micro Results Recent Results (from the past 240 hour(s))  Urine Culture     Status: Abnormal   Collection Time: 02/26/20 10:39 AM   Specimen: Urine  Result Value Ref Range Status   MICRO NUMBER: [id number]  Final   SPECIMEN QUALITY: Adequate  Final   Sample Source URINE  Final   STATUS: FINAL  Final   ISOLATE 1: Citrobacter koseri (A)  Final    Comment: Greater than 100,000 CFU/mL of Citrobacter koseri      Susceptibility   Citrobacter koseri - URINE CULTURE, REFLEX    AMOX/CLAVULANIC <=2 Sensitive     CEFAZOLIN* <=4 Not Reportable      * For infections other than uncomplicated  UTIcaused by E. coli, K. pneumoniae or P. mirabilis:Cefazolin is resistant if MIC > or = 8 mcg/mL.(Distinguishing susceptible versus intermediatefor isolates with MIC < or = 4 mcg/mL requiresadditional testing.)For uncomplicated UTI caused by E. coli,K. pneumoniae or P. mirabilis: Cefazolin issusceptible if MIC <32 mcg/mL and predictssusceptible to the oral agents cefaclor, cefdinir,cefpodoxime, cefprozil, cefuroxime, cephalexinand loracarbef.    CEFEPIME <=1 Sensitive     CEFTRIAXONE <=1 Sensitive     CIPROFLOXACIN <=0.25 Sensitive  LEVOFLOXACIN <=0.12 Sensitive     ERTAPENEM <=0.5 Sensitive     GENTAMICIN <=1 Sensitive     IMIPENEM <=0.25 Sensitive     NITROFURANTOIN 32 Sensitive     PIP/TAZO <=4 Sensitive     TOBRAMYCIN <=1 Sensitive     TRIMETH/SULFA* <=20 Sensitive      * For infections other than uncomplicated UTIcaused by E. coli, K. pneumoniae or P. mirabilis:Cefazolin is resistant if MIC > or = 8 mcg/mL.(Distinguishing susceptible versus intermediatefor isolates with MIC < or = 4 mcg/mL requiresadditional testing.)For uncomplicated UTI caused by E. coli,K. pneumoniae or P. mirabilis: Cefazolin issusceptible if MIC <32 mcg/mL and predictssusceptible to the oral agents cefaclor, cefdinir,cefpodoxime, cefprozil, cefuroxime, cephalexinand loracarbef.Legend:S = Susceptible  I = IntermediateR = Resistant  NS = Not susceptible* = Not tested  NR = Not reported**NN = See antimicrobic comments  Blood Culture (routine x 2)     Status: None   Collection Time: 02/28/20  3:23 PM   Specimen: Right Antecubital; Blood  Result Value Ref Range Status   Specimen Description RIGHT ANTECUBITAL  Final   Special Requests   Final    BOTTLES DRAWN AEROBIC AND ANAEROBIC Blood Culture adequate volume   Culture   Final    NO GROWTH 5 DAYS Performed at [facility], [address] [place], [place] [address]    Report Status 03/04/2020 FINAL  Final  Blood Culture (routine x 2)     Status: None   Collection  Time: 02/28/20  3:29 PM   Specimen: Left Antecubital; Blood  Result Value Ref Range Status   Specimen Description LEFT ANTECUBITAL  Final   Special Requests   Final    BOTTLES DRAWN AEROBIC AND ANAEROBIC Blood Culture adequate volume   Culture   Final    NO GROWTH 5 DAYS Performed at [facility], [address] [place], [place] [address]    Report Status 03/04/2020 FINAL  Final  SARS Coronavirus 2 by RT PCR (hospital order, performed in [facility] hospital lab) Nasopharyngeal Nasopharyngeal Swab     Status: None   Collection Time: 02/28/20  4:08 PM   Specimen: Nasopharyngeal Swab  Result Value Ref Range Status   SARS Coronavirus 2 NEGATIVE NEGATIVE Final    Comment: (NOTE) SARS-CoV-2 target nucleic acids are NOT DETECTED. The SARS-CoV-2 RNA is generally detectable in upper and lower respiratory specimens during the acute phase of infection. The lowest concentration of SARS-CoV-2 viral copies this assay can detect is 250 copies / mL. A negative result does not preclude SARS-CoV-2 infection and should not be used as the sole basis for treatment or other patient management decisions.  A negative result may occur with improper specimen collection / handling, submission of specimen other than nasopharyngeal swab, presence of viral mutation(s) within the areas targeted by this assay, and inadequate number of viral copies (<250 copies / mL). A negative result must be combined with clinical observations, patient history, and epidemiological information. Fact Sheet for Patients:   StrictlyIdeas.no Fact Sheet for Healthcare Providers: BankingDealers.co.za This test is not yet approved or cleared  by the [place] FDA and has been authorized for detection and/or diagnosis of SARS-CoV-2 by FDA under an Emergency Use Authorization (EUA).  This EUA will remain in effect (meaning this test can be used) for the duration of the COVID-19  declaration under Section 564(b)(1) of the Act, 21 U.S.C. section 360bbb-3(b)(1), unless the authorization is terminated or revoked sooner. Performed at [facility] [address], [place],  [place] [address]   Urine culture     Status: Abnormal   Collection Time: 02/28/20  5:40 PM   Specimen: In/Out Cath Urine  Result Value Ref Range Status   Specimen Description   Final    IN/OUT CATH URINE Performed at [facility], [address], [place], [place] [address]    Special Requests   Final    NONE Performed at [facility], [address] [place], [place] [address]    Culture 20,000 COLONIES/mL ENTEROCOCCUS FAECALIS (A)  Final   Report Status 03/01/2020 FINAL  Final   Organism ID, Bacteria ENTEROCOCCUS FAECALIS (A)  Final      Susceptibility   Enterococcus faecalis - MIC*    AMPICILLIN <=2 SENSITIVE Sensitive     NITROFURANTOIN 64 INTERMEDIATE Intermediate     VANCOMYCIN 2 SENSITIVE Sensitive     * 20,000 COLONIES/mL ENTEROCOCCUS FAECALIS    Radiology Reports CT ABDOMEN PELVIS WO CONTRAST  Result Date: 02/21/2020 CLINICAL DATA:  Diarrhea with weight loss.  Diabetes. EXAM: CT ABDOMEN AND PELVIS WITHOUT CONTRAST TECHNIQUE: Multidetector CT imaging of the abdomen and pelvis was performed following the standard protocol without IV contrast. COMPARISON:  None. FINDINGS: Lower chest: Lung bases demonstrate linear scarring over the left lower lobe. There is a 5 x 11 mm nodule over the right lower lobe as well as a 4 mm subpleural nodule over the right middle lobe. Hepatobiliary: Suggestion of several indistinct hypodense liver lesions with the best defined over the posterior right lobe measuring 3.1 cm. Possible 1.6 cm hyperdense nodule over the dome of the medial segment left lobe of the liver. Gallbladder and biliary tree are unremarkable. Pancreas: Prominence with minimal associated calcification over the body of the pancreas. Spleen: Normal. Adrenals/Urinary Tract: Adrenal glands are  normal. Kidneys are normal in size without hydronephrosis or nephrolithiasis. No focal renal mass. Ureters are normal. Single focus of air over the anterior bladder. Stomach/Bowel: Stomach and small bowel are normal. Appendix is normal. Colon is unremarkable. Vascular/Lymphatic: Minimal calcified plaque over the abdominal aorta which is normal in caliber. Few small nonspecific lymph nodes over the upper abdomen over the gastrohepatic ligament and Peri pancreatic region. Reproductive: Uterus is right of midline.  Ovaries are normal. Other: No free fluid or focal inflammatory change. Musculoskeletal: Mild degenerative changes of the spine and hips. IMPRESSION: 1.  No acute findings in the abdomen/pelvis. 2. Focal prominence with associated calcification over the body of the pancreas. Few small nonspecific adjacent lymph nodes. Recommend further evaluation with MRI. 3. A few indistinct hypodensities over the liver with the best defined over the posterior right lobe measuring 3.1 cm. Findings are indeterminate as metastatic disease is possible. This could also be further evaluated with MRI. 4. Two nodules over the right mid to lower lung with the larger over the lateral right lower lobe measuring 8 mm. Recommend chest CT with contrast for complete evaluation of the lungs and thorax. 5.  Aortic Atherosclerosis (ICD10-I70.0). 6. Tiny focus of air over the anterior bladder which may be due to recent instrumentation although can be seen due to infection or enteric fistula. Electronically Signed   By: Marin Olp M.D.   On: 02/21/2020 16:38   CT CHEST WO CONTRAST  Result Date: 02/28/2020 CLINICAL DATA:  Respiratory failure.  Pulmonary nodules EXAM: CT CHEST WITHOUT CONTRAST TECHNIQUE: Multidetector CT imaging of the chest was performed following the standard protocol without IV contrast. COMPARISON:  CT abdomen 02/21/2020 FINDINGS: Cardiovascular: Heart is normal size. Aorta is  normal caliber. Aortic atherosclerosis.  Mediastinum/Nodes: No mediastinal, hilar, or axillary adenopathy. Lungs/Pleura: 13 mm spiculated nodule in the left upper lobe on image 28. Right lower lobe nodule as seen on prior abdominal CT measures 8 mm on image 64. Small subpleural nodule in the right middle lobe measures 4 mm. Mild elevation of the right hemidiaphragm. Bibasilar atelectasis or scarring. No effusions. Upper Abdomen: Ill-defined hypodensities again noted in the liver, best seen in the posterior right hepatic lobe measuring approximately 3.3 cm, similar to prior abdominal CT. Musculoskeletal: Chest wall soft tissues are unremarkable. No acute bony abnormality. IMPRESSION: Previously seen nodules in the right lower lobe and right middle lobe as seen on prior abdominal CT. There is also a spiculated 13 mm nodule in the left upper lobe. Appearance is concerning for possible primary lung cancer. Consider further evaluation with PET CT. Ill-defined low-density lesions in the liver as seen on prior abdominal CT. Cannot exclude metastatic disease. Aortic Atherosclerosis (ICD10-I70.0). Electronically Signed   By: Rolm Baptise M.D.   On: 02/28/2020 20:55   MR ABDOMEN W WO CONTRAST  Result Date: 03/04/2020 CLINICAL DATA:  EVALUATE LIVER LESIONS. EXAM: MRI ABDOMEN WITHOUT AND WITH CONTRAST TECHNIQUE: Multiplanar multisequence MR imaging of the abdomen was performed both before and after the administration of intravenous contrast. CONTRAST:  58mL GADAVIST GADOBUTROL 1 MMOL/ML IV SOLN COMPARISON:  02/28/2020 FINDINGS: Lower chest: Subpleural consolidation noted in the right lung base. Hepatobiliary: There are multifocal rim enhancing lesions involving both lobes of liver suspicious for metastatic disease. -Index lesion within segment 7/6 measures 5.2 by 3.2 cm, image 22/19. -Index lesion in segment 2 of left lobe of liver measures 5.9 x 4.3 cm, image 15/9. -Segment 3 index lesion measures 2.7 by 2.5 cm lesion, image 24/19. -Segment 8/4 index lesion  measures 4.0 x 3.8 cm, image 11/19. Tiny stones versus sludge noted within the dependent portion of the gallbladder. No significant gallbladder wall thickening or inflammation. No bile duct dilatation. Pancreas: There is a hypoenhancing mass involving the body and tail of pancreas which measures approximately 9.0 x 3.7 cm, image 34/17. This is highly concerning for primary pancreatic adenocarcinoma. Tumor encasement of the superior mesenteric artery is identified, image 38/21. The celiac artery is not well seen and may also be encased by tumor. Difficult to exclude involvement of the portal venous confluence due to motion artifact. Spleen:  Within normal limits in size and appearance. Adrenals/Urinary Tract: Normal adrenal glands. No kidney mass or hydronephrosis. Stomach/Bowel: Visualized portions within the abdomen are unremarkable. Vascular/Lymphatic: Normal appearance of the abdominal aorta. Within the limitations of motion artifact no adenopathy identified within the upper abdomen. Other:  No free fluid or fluid collections. Musculoskeletal: No suspicious bone lesions identified. IMPRESSION: 1. Large hypoenhancing mass involving the body and tail of pancreas is highly concerning for primary pancreatic adenocarcinoma. There is evidence for encasement of the superior mesenteric artery and possible involvement of the celiac trunk and portal venous confluence. 2. Multifocal rim enhancing lesions involving both lobes of liver suspicious for metastatic disease. Electronically Signed   By: Kerby Moors M.D.   On: 03/04/2020 14:15   MR 3D Recon At Scanner  Result Date: 03/04/2020 CLINICAL DATA:  EVALUATE LIVER LESIONS. EXAM: MRI ABDOMEN WITHOUT AND WITH CONTRAST TECHNIQUE: Multiplanar multisequence MR imaging of the abdomen was performed both before and after the administration of intravenous contrast. CONTRAST:  71mL GADAVIST GADOBUTROL 1 MMOL/ML IV SOLN COMPARISON:  02/28/2020 FINDINGS: Lower chest: Subpleural  consolidation noted in the right  lung base. Hepatobiliary: There are multifocal rim enhancing lesions involving both lobes of liver suspicious for metastatic disease. -Index lesion within segment 7/6 measures 5.2 by 3.2 cm, image 22/19. -Index lesion in segment 2 of left lobe of liver measures 5.9 x 4.3 cm, image 15/9. -Segment 3 index lesion measures 2.7 by 2.5 cm lesion, image 24/19. -Segment 8/4 index lesion measures 4.0 x 3.8 cm, image 11/19. Tiny stones versus sludge noted within the dependent portion of the gallbladder. No significant gallbladder wall thickening or inflammation. No bile duct dilatation. Pancreas: There is a hypoenhancing mass involving the body and tail of pancreas which measures approximately 9.0 x 3.7 cm, image 34/17. This is highly concerning for primary pancreatic adenocarcinoma. Tumor encasement of the superior mesenteric artery is identified, image 38/21. The celiac artery is not well seen and may also be encased by tumor. Difficult to exclude involvement of the portal venous confluence due to motion artifact. Spleen:  Within normal limits in size and appearance. Adrenals/Urinary Tract: Normal adrenal glands. No kidney mass or hydronephrosis. Stomach/Bowel: Visualized portions within the abdomen are unremarkable. Vascular/Lymphatic: Normal appearance of the abdominal aorta. Within the limitations of motion artifact no adenopathy identified within the upper abdomen. Other:  No free fluid or fluid collections. Musculoskeletal: No suspicious bone lesions identified. IMPRESSION: 1. Large hypoenhancing mass involving the body and tail of pancreas is highly concerning for primary pancreatic adenocarcinoma. There is evidence for encasement of the superior mesenteric artery and possible involvement of the celiac trunk and portal venous confluence. 2. Multifocal rim enhancing lesions involving both lobes of liver suspicious for metastatic disease. Electronically Signed   By: Kerby Moors M.D.    On: 03/04/2020 14:15   DG Chest Port 1 View  Result Date: 02/28/2020 CLINICAL DATA:  Shortness of breath. EXAM: PORTABLE CHEST 1 VIEW COMPARISON:  Chest x-ray dated July 19, 2018. FINDINGS: The heart size and mediastinal contours are within normal limits. Pulmonary vascular congestion with chronically coarse interstitial markings. New 1.5 cm nodule in the left upper lobe. No focal consolidation, pleural effusion, or pneumothorax. No acute osseous abnormality. IMPRESSION: 1. New 1.5 cm nodule in the left upper lobe. Chest CT is recommended for further evaluation. 2. Mild pulmonary vascular congestion. Electronically Signed   By: Titus Dubin M.D.   On: 02/28/2020 15:49   ECHOCARDIOGRAM COMPLETE  Result Date: 02/29/2020    ECHOCARDIOGRAM REPORT   Patient Name:   Kathryn Cook Date of Exam: 02/29/2020 Medical Rec #:  [id number]           Height:       64.0 in Accession #:    [id number]          Weight:       223.2 lb Date of Birth:  December 21, 1950           BSA:          2.050 m Patient Age:    9 years            BP:           105/74 mmHg Patient Gender: F                   HR:           73 bpm. Exam Location:  [facility] Procedure: 2D Echo Indications:    Congestive Heart Failure 428.0 / I50.9  History:        Patient has prior history of Echocardiogram examinations, most  recent 06/11/2013. CHF, Stroke; Risk Factors:Hypertension,                 Dyslipidemia, Diabetes and Former Smoker. LVH.  Sonographer:    Leavy Cella RDCS (AE) Referring Phys: [place]  1. Left ventricular ejection fraction, by estimation, is 65 to 70%. The left ventricle has normal function. The left ventricle has no regional wall motion abnormalities. There is mild left ventricular hypertrophy. Left ventricular diastolic parameters are consistent with Grade I diastolic dysfunction (impaired relaxation).  2. Right ventricular systolic function is low normal. The right ventricular size is  mildly enlarged. There is mildly elevated pulmonary artery systolic pressure.  3. The mitral valve is normal in structure. No evidence of mitral valve regurgitation. No evidence of mitral stenosis.  4. The aortic valve is tricuspid. Aortic valve regurgitation is not visualized. No aortic stenosis is present. FINDINGS  Left Ventricle: Left ventricular ejection fraction, by estimation, is 65 to 70%. The left ventricle has normal function. The left ventricle has no regional wall motion abnormalities. The left ventricular internal cavity size was normal in size. There is  mild left ventricular hypertrophy. Left ventricular diastolic parameters are consistent with Grade I diastolic dysfunction (impaired relaxation). Normal left ventricular filling pressure. Right Ventricle: The right ventricular size is mildly enlarged. Right vetricular wall thickness was not assessed. Right ventricular systolic function is low normal. There is mildly elevated pulmonary artery systolic pressure. The tricuspid regurgitant velocity is 2.86 m/s, and with an assumed right atrial pressure of 10 mmHg, the estimated right ventricular systolic pressure is 14.4 mmHg. Left Atrium: Left atrial size was normal in size. Right Atrium: Right atrial size was not well visualized. Pericardium: There is no evidence of pericardial effusion. Mitral Valve: The mitral valve is normal in structure. No evidence of mitral valve regurgitation. No evidence of mitral valve stenosis. Tricuspid Valve: The tricuspid valve is normal in structure. Tricuspid valve regurgitation is mild . No evidence of tricuspid stenosis. Aortic Valve: The aortic valve is tricuspid. Aortic valve regurgitation is not visualized. No aortic stenosis is present. Aortic valve mean gradient measures 4.0 mmHg. Aortic valve peak gradient measures 7.4 mmHg. Aortic valve area, by VTI measures 2.61 cm. Pulmonic Valve: The pulmonic valve was not well visualized. Pulmonic valve regurgitation is not  visualized. No evidence of pulmonic stenosis. Aorta: The aortic root is normal in size and structure. Pulmonary Artery: Indeterminant PASP, IVC poorly visualized. Venous: The inferior vena cava was not well visualized. IAS/Shunts: The interatrial septum was not well visualized.  LEFT VENTRICLE PLAX 2D LVIDd:         4.15 cm  Diastology LVIDs:         1.76 cm  LV e' lateral:   8.38 cm/s LV PW:         1.12 cm  LV E/e' lateral: 4.3 LV IVS:        1.17 cm  LV e' medial:    5.66 cm/s LVOT diam:     2.00 cm  LV E/e' medial:  6.4 LV SV:         55 LV SV Index:   27 LVOT Area:     3.14 cm  RIGHT VENTRICLE RV S prime:     8.05 cm/s TAPSE (M-mode): 1.6 cm LEFT ATRIUM           Index       RIGHT ATRIUM           Index LA diam:  2.10 cm 1.02 cm/m  RA Area:     13.30 cm LA Vol (A2C): 21.8 ml 10.64 ml/m RA Volume:   36.20 ml  17.66 ml/m LA Vol (A4C): 32.1 ml 15.66 ml/m  AORTIC VALVE AV Area (Vmax):    2.37 cm AV Area (Vmean):   2.25 cm AV Area (VTI):     2.61 cm AV Vmax:           135.74 cm/s AV Vmean:          96.225 cm/s AV VTI:            0.210 m AV Peak Grad:      7.4 mmHg AV Mean Grad:      4.0 mmHg LVOT Vmax:         102.35 cm/s LVOT Vmean:        68.803 cm/s LVOT VTI:          0.174 m LVOT/AV VTI ratio: 0.83  AORTA Ao Root diam: 3.00 cm MITRAL VALVE               TRICUSPID VALVE MV Area (PHT): 1.98 cm    TR Peak grad:   32.7 mmHg MV Decel Time: 384 msec    TR Vmax:        286.00 cm/s MV E velocity: 36.40 cm/s MV A velocity: 75.20 cm/s  SHUNTS MV E/A ratio:  0.48        Systemic VTI:  0.17 m                            Systemic Diam: 2.00 cm Carlyle Dolly MD Electronically signed by Carlyle Dolly MD Signature Date/Time: 02/29/2020/10:45:25 AM    Final      CBC Recent Labs  Lab 02/28/20 1522 03/02/20 0405 03/03/20 0506 03/05/20 0546  WBC 9.5 10.8* 10.3 7.9  HGB 12.5 11.4* 11.7* 11.5*  HCT 40.6 37.3 37.8 37.1  PLT 243 181 180 189  MCV 85.5 86.3 87.1 84.9  MCH 26.3 26.4 27.0 26.3  MCHC 30.8 30.6  31.0 31.0  RDW 16.5* 16.7* 16.8* 16.4*  LYMPHSABS 2.0  --   --   --   MONOABS 0.9  --   --   --   EOSABS 0.1  --   --   --   BASOSABS 0.1  --   --   --     Chemistries  Recent Labs  Lab 02/28/20 1522 02/29/20 0551 03/02/20 0405 03/03/20 0506 03/05/20 0546  NA 139 141 144 145 142  K 3.7 4.1 4.0 4.1 3.3*  CL 110 112* 117* 117* 113*  CO2 17* 20* 19* 20* 20*  GLUCOSE 47* 271* 185* 181* 65*  BUN 60* 49* 30* 26* 19  CREATININE 3.60* 2.86* 1.96* 1.75* 1.51*  CALCIUM 9.3 8.2* 8.8* 9.0 9.1  AST 15  --  14* 15  --   ALT 12  --  13 12  --   ALKPHOS 84  --  70 66  --   BILITOT 0.5  --  0.4 0.2*  --    ------------------------------------------------------------------------------------------------------------------ No results for input(s): CHOL, HDL, LDLCALC, TRIG, CHOLHDL, LDLDIRECT in the last 72 hours.  Lab Results  Component Value Date   HGBA1C 7.4 (A) 01/28/2020   ------------------------------------------------------------------------------------------------------------------ No results for input(s): TSH, T4TOTAL, T3FREE, THYROIDAB in the last 72 hours.  Invalid input(s): FREET3 ------------------------------------------------------------------------------------------------------------------ No results for input(s): VITAMINB12, FOLATE, FERRITIN, TIBC, IRON, RETICCTPCT in the last  72 hours.  Coagulation profile Recent Labs  Lab 02/28/20 1522  INR 1.3*    No results for input(s): DDIMER in the last 72 hours.  Cardiac Enzymes No results for input(s): CKMB, TROPONINI, MYOGLOBIN in the last 168 hours.  Invalid input(s): CK ------------------------------------------------------------------------------------------------------------------    Component Value Date/Time   BNP 356.0 (H) 02/28/2020 1522   Roxan Hockey M.D on 03/05/2020 at 9:20 PM  Go to [email or web address] - for contact info  [facility] - Office  [phone]

## 2020-03-05 NOTE — Progress Notes (Addendum)
Inpatient Diabetes Program Recommendations  AACE/ADA: New Consensus Statement on Inpatient Glycemic Control (2015)  Target Ranges:  Prepandial:   less than 140 mg/dL      Peak postprandial:   less than 180 mg/dL (1-2 hours)      Critically ill patients:  140 - 180 mg/dL   Lab Results  Component Value Date   GLUCAP 48 (L) 03/05/2020   HGBA1C 7.4 (A) 01/28/2020    Review of Glycemic Control  Results for Kathryn Cook, Kathryn Cook (MRN [id number]) as of 03/05/2020 07:55  Ref. Range 03/04/2020 07:35 03/04/2020 12:55 03/04/2020 17:04 03/04/2020 21:36 03/05/2020 07:41  Glucose-Capillary Latest Ref Range: 70 - 99 mg/dL 90 139 (H) 184 (H) 128 (H) 48 (L)    Diabetes history:  DM2  Outpatient Diabetes medications:  Basaglar 50 units QHS + Trajenta 5 mg daily + Novolog 10-16 TID with meals  Current orders for Inpatient glycemic control:  Lantus 20 units QHS + Novolog 0-9 units TID   Inpatient Diabetes Program Recommendations:     Decrease Lantus to 15 units QHS Might need very small meal coverage depending on trends   Will continue to follow while inpatient.  Thank you, Reche Dixon, RN, BSN Diabetes Coordinator Inpatient Diabetes Program [phone] (team pager from 8a-5p)

## 2020-03-05 NOTE — Progress Notes (Signed)
Subjective: Feeling well this morning. Enjoyed her breakfast as she was given boiled eggs this morning. Had 1 loose BM this morning after drinking apple juice. Overall, feels diarrhea is somewhat better. No abdominal pain, N/V. Remembers being given MRI results yesterday with concerns of stage 4 pancreatic cancer with liver mets. Remembers discussing options with Dr. Denton Brick and her sister yesterday. States she wasn't sure what they were going to do about it. Waiting for palliative consult today.   Objective: Vital signs in last 24 hours: Temp:  [97.6 F (36.4 C)-98.9 F (37.2 C)] 98.6 F (37 C) (06/09 0601) Pulse Rate:  [66-70] 70 (06/09 0601) Resp:  [16-20] 20 (06/09 0601) BP: (122-140)/(80-85) 122/80 (06/09 0601) SpO2:  [94 %-96 %] 94 % (06/09 0601) Last BM Date: 03/03/20 General: Alert and oriented, pleasant Head:  Normocephalic and atraumatic. Eyes:  No icterus, sclera clear. Conjuctiva pink.  Abdomen:  Bowel sounds present, soft, non-tender, non-distended. No HSM or hernias noted. No rebound or guarding. No masses appreciated  Msk:  Symmetrical without gross deformities.  Extremities:  Without edema. Neurologic:  Alert and  oriented x4;  grossly normal neurologically. Psych: Normal mood and affect.  Intake/Output from previous day: 06/08 0701 - 06/09 0700 In: 2203.6 [P.O.:240; I.V.:1663.6; IV Piggyback:300] Out: -  Intake/Output this shift: No intake/output data recorded.  Lab Results: Recent Labs    03/03/20 0506 03/05/20 0546  WBC 10.3 7.9  HGB 11.7* 11.5*  HCT 37.8 37.1  PLT 180 189   BMET Recent Labs    03/03/20 0506 03/05/20 0546  NA 145 142  K 4.1 3.3*  CL 117* 113*  CO2 20* 20*  GLUCOSE 181* 65*  BUN 26* 19  CREATININE 1.75* 1.51*  CALCIUM 9.0 9.1   LFT Recent Labs    03/03/20 0506  PROT 6.5  ALBUMIN 2.8*  AST 15  ALT 12  ALKPHOS 66  BILITOT 0.2*    Studies/Results: MR ABDOMEN W WO CONTRAST  Result Date: 03/04/2020 CLINICAL DATA:   EVALUATE LIVER LESIONS. EXAM: MRI ABDOMEN WITHOUT AND WITH CONTRAST TECHNIQUE: Multiplanar multisequence MR imaging of the abdomen was performed both before and after the administration of intravenous contrast. CONTRAST:  38mL GADAVIST GADOBUTROL 1 MMOL/ML IV SOLN COMPARISON:  02/28/2020 FINDINGS: Lower chest: Subpleural consolidation noted in the right lung base. Hepatobiliary: There are multifocal rim enhancing lesions involving both lobes of liver suspicious for metastatic disease. -Index lesion within segment 7/6 measures 5.2 by 3.2 cm, image 22/19. -Index lesion in segment 2 of left lobe of liver measures 5.9 x 4.3 cm, image 15/9. -Segment 3 index lesion measures 2.7 by 2.5 cm lesion, image 24/19. -Segment 8/4 index lesion measures 4.0 x 3.8 cm, image 11/19. Tiny stones versus sludge noted within the dependent portion of the gallbladder. No significant gallbladder wall thickening or inflammation. No bile duct dilatation. Pancreas: There is a hypoenhancing mass involving the body and tail of pancreas which measures approximately 9.0 x 3.7 cm, image 34/17. This is highly concerning for primary pancreatic adenocarcinoma. Tumor encasement of the superior mesenteric artery is identified, image 38/21. The celiac artery is not well seen and may also be encased by tumor. Difficult to exclude involvement of the portal venous confluence due to motion artifact. Spleen:  Within normal limits in size and appearance. Adrenals/Urinary Tract: Normal adrenal glands. No kidney mass or hydronephrosis. Stomach/Bowel: Visualized portions within the abdomen are unremarkable. Vascular/Lymphatic: Normal appearance of the abdominal aorta. Within the limitations of motion artifact no adenopathy identified  within the upper abdomen. Other:  No free fluid or fluid collections. Musculoskeletal: No suspicious bone lesions identified. IMPRESSION: 1. Large hypoenhancing mass involving the body and tail of pancreas is highly concerning for  primary pancreatic adenocarcinoma. There is evidence for encasement of the superior mesenteric artery and possible involvement of the celiac trunk and portal venous confluence. 2. Multifocal rim enhancing lesions involving both lobes of liver suspicious for metastatic disease. Electronically Signed   By: Kerby Moors M.D.   On: 03/04/2020 14:15   MR 3D Recon At Scanner  Result Date: 03/04/2020 CLINICAL DATA:  EVALUATE LIVER LESIONS. EXAM: MRI ABDOMEN WITHOUT AND WITH CONTRAST TECHNIQUE: Multiplanar multisequence MR imaging of the abdomen was performed both before and after the administration of intravenous contrast. CONTRAST:  74mL GADAVIST GADOBUTROL 1 MMOL/ML IV SOLN COMPARISON:  02/28/2020 FINDINGS: Lower chest: Subpleural consolidation noted in the right lung base. Hepatobiliary: There are multifocal rim enhancing lesions involving both lobes of liver suspicious for metastatic disease. -Index lesion within segment 7/6 measures 5.2 by 3.2 cm, image 22/19. -Index lesion in segment 2 of left lobe of liver measures 5.9 x 4.3 cm, image 15/9. -Segment 3 index lesion measures 2.7 by 2.5 cm lesion, image 24/19. -Segment 8/4 index lesion measures 4.0 x 3.8 cm, image 11/19. Tiny stones versus sludge noted within the dependent portion of the gallbladder. No significant gallbladder wall thickening or inflammation. No bile duct dilatation. Pancreas: There is a hypoenhancing mass involving the body and tail of pancreas which measures approximately 9.0 x 3.7 cm, image 34/17. This is highly concerning for primary pancreatic adenocarcinoma. Tumor encasement of the superior mesenteric artery is identified, image 38/21. The celiac artery is not well seen and may also be encased by tumor. Difficult to exclude involvement of the portal venous confluence due to motion artifact. Spleen:  Within normal limits in size and appearance. Adrenals/Urinary Tract: Normal adrenal glands. No kidney mass or hydronephrosis. Stomach/Bowel:  Visualized portions within the abdomen are unremarkable. Vascular/Lymphatic: Normal appearance of the abdominal aorta. Within the limitations of motion artifact no adenopathy identified within the upper abdomen. Other:  No free fluid or fluid collections. Musculoskeletal: No suspicious bone lesions identified. IMPRESSION: 1. Large hypoenhancing mass involving the body and tail of pancreas is highly concerning for primary pancreatic adenocarcinoma. There is evidence for encasement of the superior mesenteric artery and possible involvement of the celiac trunk and portal venous confluence. 2. Multifocal rim enhancing lesions involving both lobes of liver suspicious for metastatic disease. Electronically Signed   By: Kerby Moors M.D.   On: 03/04/2020 14:15    Assessment: 69 year old female, resident of [facility], admitted with acute renal injury in setting of persistent diarrhea, UTI (currently being treated with rocephin), diastolic heart failure, acute respiratory failure, with GI consult requested due to diarrhea that has now persisted over 2 months.   Persistent diarrhea: Outpatient evaluation mid May 2021 with negative Cdiff, GI pathogen panel, normal celiac serologies, normal TSH. CT without IV contrast showed concerning findings including focal prominence with associated calcification over pancreas body, indistinct hypodensities of liver, lung nodules, and tiny focus of air over anterior bladder that could be due to infection or enteric fistula. She was started on pancreatic enzymes and scheduled for MRI. Last TCS November 2019 with tubular adenomas. Pancreatic enzyme dosing was increased on 6/7 with only 2 BMs yesterday 1 BM this morning after apple juice. MRI abdomen yesterday with findings suggestive of primary pancreatic adenocarcinoma with liver mets. Suspect this is the  etiology of her diarrhea. Notably, she is on antibiotics for UTI at this time which could worsen diarrhea and increase risk  for C. Diff. Consider rechecking stool studies should she have acute worsening of diarrhea. Will plan to continue pancreatic enzymes as this seems to be helping and imodium as needed.   Presumed metastatic stage IV pancreatic cancer with liver mets: MRI/MRCP 6/8 with large hypoenhancing mass involving the body and tail of pancreas is highly concerning for primary pancreatic adenocarcinoma. There is evidence for encasement of the superior mesenteric artery and possible involvement of the celiac trunk and portal venous confluence. Multifocal rim enhancing lesions involving both lobes of liver suspicious for metastatic disease. This is likely the cause of her clinical decline over the last few months with diarrhea, decreased appetite, and weight loss. Dr. Denton Brick discussed findings with patient and her sister yesterday. They are leaning towards palliative care/hospice which I think is appropriate considering patients functional status. Not sure oncology consult would be beneficial in this case. Palliative has been consulted.   Plan: Continue Creon-72,000 units with meals, 36,000 units with snacks. Imodium as needed Continue probiotic  Lactose free diet Consider rechecking for C. diff if diarrhea acutely worsens as she is currently being treated with antibiotics for UTI. Agree with palliative consult      LOS: 6 days    03/05/2020, 7:29 AM   Aliene Altes, PA-C [facility]
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
## 2020-03-06 DIAGNOSIS — R9389 Abnormal findings on diagnostic imaging of other specified body structures: Secondary | ICD-10-CM

## 2020-03-06 DIAGNOSIS — N183 Chronic kidney disease, stage 3 unspecified: Secondary | ICD-10-CM

## 2020-03-06 DIAGNOSIS — F0150 Vascular dementia without behavioral disturbance: Secondary | ICD-10-CM

## 2020-03-06 DIAGNOSIS — K8689 Other specified diseases of pancreas: Secondary | ICD-10-CM

## 2020-03-06 DIAGNOSIS — Z09 Encounter for follow-up examination after completed treatment for conditions other than malignant neoplasm: Secondary | ICD-10-CM

## 2020-03-06 DIAGNOSIS — N179 Acute kidney failure, unspecified: Principal | ICD-10-CM

## 2020-03-06 LAB — GLUCOSE, CAPILLARY
Glucose-Capillary: 114 mg/dL — ABNORMAL HIGH (ref 70–99)
Glucose-Capillary: 174 mg/dL — ABNORMAL HIGH (ref 70–99)
Glucose-Capillary: 195 mg/dL — ABNORMAL HIGH (ref 70–99)
Glucose-Capillary: 157 mg/dL — ABNORMAL HIGH (ref 70–99)

## 2020-03-06 MED ORDER — PANCRELIPASE (LIP-PROT-AMYL) 12000-38000 UNITS PO CPEP
108000.0000 [IU] | ORAL_CAPSULE | Freq: Three times a day (TID) | ORAL | Status: DC
  Administered 2020-03-06 – 2020-03-07 (×4): 108000 [IU] via ORAL
  Filled 2020-03-06 (×4): qty 9

## 2020-03-06 MED ORDER — GLYCOPYRROLATE 1 MG PO TABS
2.0000 mg | ORAL_TABLET | Freq: Three times a day (TID) | ORAL | Status: DC
  Administered 2020-03-06 – 2020-03-07 (×4): 2 mg via ORAL
  Filled 2020-03-06 (×4): qty 2

## 2020-03-06 MED ORDER — DIPHENOXYLATE-ATROPINE 2.5-0.025 MG PO TABS
1.0000 | ORAL_TABLET | Freq: Four times a day (QID) | ORAL | Status: DC
  Administered 2020-03-06 – 2020-03-07 (×6): 1 via ORAL
  Filled 2020-03-06 (×6): qty 1

## 2020-03-06 NOTE — Progress Notes (Addendum)
Palliative:  HPI: 69 y.o. female  with past medical history of diastolic CHF, CKD, diabetes, dementia, CVA, hypertension, diarrhea and poor intake x 6 weeks followed by GI admitted on 02/28/2020 with ongoing diarrhea and poor po intake with dehydration and acute kidney injury, confusion with UTI and CT chest/abd/pelvis with focal prominence with calcification over body of pancreas, concern for liver mets, and lung nodules (mets from pancreas vs second primary cancer). Ongoing conversations with family regarding poor prognosis and goals of care. Palliative care consulted to assist with these conversations.   I met today at Kathryn Cook's bedside. She is awake and alert and in good spirits. She has no pain or nausea. Continues with diarrhea but she feels this may be "a little bit better." By ~1300 she had only had 1-2 bowel movements although she is incontinent and large BM. PT has seen and she did fairly well with walker and some supervision and verbal cues.   I called group home caregiver, Larwance Sachs, and discussed diarrhea and Kathryn Cook is aware that everything that Kathryn Cook eats goes straight through her. After discussion it was decided that Kathryn Cook will return to group home with hospice. Group home may not be able to care for in this state very long but as long as she can walk they are willing to try and give her the joy of spending some more time at her home with her friends (even if only for a few days or a week). Hospice to follow closely and anticipate that she will need transition to hospice facility in the near future.   I called and spoke with sister, Kathryn Cook, and explained my conversation with Kathryn Cook above and she agrees with plan. She is happy that her sister will be able to spend some time at her home. She agrees with transition to hospice facility when care becomes too much for group home.   Exam: Alert, mostly oriented but underlying confusion baseline. No distress. Still with  ongoing diarrhea. Breathing regular, unlabored. Abd soft, nontender.   Plan: - Return to group home with hospice care. Anticipate need for transition to hospice facility in the near future.  - Diarrhea: Increase Creon per GI recommendations. Scheduled Lomotil and Robinul and may titrate up as needed. Consider addition of dicyclomine TID ac per GI recommendations.   [place], NP Palliative Medicine Team Pager [phone] (Please see [email or web address] for schedule) Team Phone [phone]    Greater than 50%  of this time was spent counseling and coordinating care related to the above assessment and plan

## 2020-03-06 NOTE — Progress Notes (Signed)
Patient Demographics:    K[facility]ryn [facility], is a 69 y.o. female, DOB - 03-08-1951, ZHG:[id number]  Admit date - 02/28/2020   Admitting Physician Ejiroghene Arlyce Dice, MD  Outpatient Primary MD for the patient is [name], MD  LOS - 7   Chief Complaint  Patient presents with  . Urinary Tract Infection        Subjective:    [facility] today has no fevers, no emesis,  No chest pain,   --Diarrhea persist -Appetite is not great --Plan is to [facility] with hospice following, possible transition to residential hospice in the near future if she declines  Assessment  & Plan :    Principal Problem:   Pancreatic mass/-Presumed metastatic stage IV pancreatic cancer with liver mets Active Problems:   HTN (hypertension)   Kidney disease, chronic, stage III (GFR 30-59 ml/min)   Diastolic congestive heart failure, NYHA class 1 (HCC)   H/O: CVA (cerebrovascular accident)   Dementia ([facility])   Type II diabetes mellitus with neurological manifestations ([facility])   Essential hypertension, benign   Diarrhea   AKI (acute kidney injury) ([facility])   DNR (do not resuscitate)   Palliative care by specialist   Abnormal CT scan  Brief Summary:- 69 y.o. female with medical history significant for diastolic CHF, CKD, diabetes mellitus, dementia, CVA, hypertension admitted on 02/28/20 with AKI in the setting of diarrhea   -MRCP Abdomen IMPRESSION: 1) Large hypoenhancing mass involving the body and tail of pancreas is highly concerning for primary pancreatic adenocarcinoma. There is evidence for encasement of the superior mesenteric artery and possible involvement of the celiac trunk and portal venous confluence.  2) Multifocal rim enhancing lesions involving both lobes of liver suspicious for metastatic disease.   --Presumed metastatic stage IV pancreatic cancer with liver mets Discussed  with Pt with RN [facility],  Spoke with Pt's sister face to face --- Ms [facility] [phone] and I called and spoke with ALF/Group home supervisor Ms [facility]----[phone]  A/p 1)AKI----acute kidney injury on CKD stage - IIIB   creatinine on admission= 3.6, baseline creatinine = around 2    , creatinine is now= 1.75, renally adjust medications, avoid nephrotoxic agents / dehydration  / hypotension  -Worsening renal function related to dehydration in the setting of persistent diarrhea and GI losses -Patient at risk for severe dehydration electrolyte abnormalities given persistent diarrhea   2)HFpEF--Echo with EF of 65 to 70% and grade 1 diastolic dysfunction--- be judicious with IV fluids  3) chronic diarrhea--58-month duration--extensive outpatient GI work-up, C. difficile and GI pathogen negative and normal celiac serologies as well as normal TSH as outpatient (May 2021) -Inpatient GI consult appreciated ---c/n Creon at increased doses -Add Lomotil MRI abdomen --with --Presumed metastatic stage IV pancreatic cancer with liver mets Spoke with Pt's sister (Ms [facility]) and patient's nephew face to face ---  [phone] and I called and spoke with ALF/Group home supervisor Ms [facility]----[phone]  4)Citrobacter koseri and Enterococcus faecalis UTI--okay to stop IV Rocephin, -Continue ampicillin -starting 03/04/2020  5)Acute hypoxic respiratory failure----CT chest without contrast suggest possible primary lung cancer with a spiculated nodule in the left upper lobe -Family leaning towards hospice  6)H/o Vascular dementia and prior CVA-aspirin,  statin, Aricept and Celexa  7)Recent abnormal CT abdomen/pelvis findings- 02/21/20-  A few indistinct hypodensities over the liver with the best defined over the posterior right lobe measuring 3.1 cm. Findings are indeterminate as metastatic disease is possible. This could also be further evaluated with MRI. -Being  followed by gastroenterology,  -Abdominal MRI -with -Presumed metastatic stage IV pancreatic cancer with liver mets Discussed with Pt with RN [facility],  Spoke with Pt's sister face to face --- Ms [facility] [phone] and I called and spoke with ALF/Group home supervisor Ms [facility]----[phone]  8)HTN--- hypotension resolved with IV fluids, Norvasc Coreg Lasix and losartan currently on hold  9)DM2-A1c 7.4 reflecting uncontrolled DM PTA -Hold   Tradjenta -c/n  Lantus at 20 units nightly, patient was on 50 units PTA Use Novolog/Humalog Sliding scale insulin with Accu-Cheks/Fingersticks as ordered   9)Social/Ethics--stage IV metastatic pancreatic cancer with liver mass and possible lung cancer as well visualized mets to the lungs in addition -Patient sister requesting hospice consult --Palliative care consult appreciated -  Disposition/Need for in-[facility] Stay- patient unable to be discharged at this time due to --acute kidney injury and dehydration requiring continuous IV fluids and close monitoring due to risk for volume overload and CHF exacerbation -Patient with persistent profuse large-volume diarrhea at risk for recurrent AKI and electrolyte of normalities if work-up not completed -Abdominal MRI- with --Presumed metastatic stage IV pancreatic cancer with liver mets -Plan is to discharge back to [facility] with hospice following, possible transition to residential hospice in the near future if she declines  Status is: Inpatient  Remains inpatient appropriate because:Altered mental status, IV treatments appropriate due to intensity of illness or inability to take PO and Inpatient level of care appropriate due to severity of illness   Dispo: The patient is from: Home              Anticipated d/c is to: Group home              Anticipated d/c date is: 1              Patient currently is not medically stable to d/c.  Barriers: Not Clinically Stable- acute  kidney injury and dehydration requiring continuous IV fluids and close monitoring due to risk for volume overload and CHF exacerbation -Patient with persistent profuse large-volume diarrhea at risk for recurrent AKI and electrolyte of normalities if work-up not completed -Abdominal MRI with stage IV pancreatic cancer -Plan is to discharge back to [place] with hospice following, possible transition to residential hospice in the near future if she declines  Code Status : Full  Family Communication:   Sister and group home administrator  DVT Prophylaxis  :   - Heparin - SCDs   Lab Results  Component Value Date   PLT 189 03/05/2020    Inpatient Medications  Scheduled Meds: . aspirin EC  325 mg Oral Daily  . citalopram  40 mg Oral Daily  . diphenoxylate-atropine  1 tablet Oral QID  . donepezil  10 mg Oral QHS  . glycopyrrolate  2 mg Oral TID  . heparin  5,000 Units Subcutaneous Q8H  . insulin aspart  0-5 Units Subcutaneous QHS  . insulin aspart  0-9 Units Subcutaneous TID WC  . insulin glargine  20 Units Subcutaneous QHS  . lipase/protease/amylase  108,000 Units Oral TID WC  . lipase/protease/amylase  36,000 Units Oral With snacks  . saccharomyces boulardii  250 mg Oral BID  . umeclidinium  bromide  1 puff Inhalation Daily   Continuous Infusions: . sodium chloride 50 mL/hr at 03/05/20 1519  . ampicillin (OMNIPEN) IV 1 g (03/06/20 1455)   PRN Meds:.acetaminophen **OR** acetaminophen, ipratropium-albuterol, ondansetron **OR** ondansetron (ZOFRAN) IV, polyethylene glycol    Anti-infectives (From admission, onward)   Start     Dose/Rate Route Frequency Ordered Stop   03/04/20 1200  ampicillin (OMNIPEN) 1 g in sodium chloride 0.9 % 100 mL IVPB     Discontinue     1 g 300 mL/hr over 20 Minutes Intravenous Every 6 hours 03/04/20 0959     02/29/20 1800  cefTRIAXone (ROCEPHIN) 2 g in sodium chloride 0.9 % 100 mL IVPB  Status:  Discontinued        2 g 200 mL/hr over 30  Minutes Intravenous Every 24 hours 02/28/20 2005 03/04/20 0959   02/28/20 1915  cefTRIAXone (ROCEPHIN) 1 g in sodium chloride 0.9 % 100 mL IVPB       Note to Pharmacy: Dose adjusted for weight.   1 g 200 mL/hr over 30 Minutes Intravenous  Once 02/28/20 1904 02/28/20 2130   02/28/20 1600  cefTRIAXone (ROCEPHIN) 1 g in sodium chloride 0.9 % 100 mL IVPB        1 g 200 mL/hr over 30 Minutes Intravenous  Once 02/28/20 1511 02/28/20 1627        Objective:   Vitals:   03/05/20 1957 03/05/20 2047 03/06/20 0916 03/06/20 1302  BP:  129/77  131/74  Pulse:  72  76  Resp:  20  20  Temp:  98.9 F (37.2 C)  98.8 F (37.1 C)  TempSrc:  Oral  Oral  SpO2: 95% 93% 90% 92%  Weight:      Height:        Wt Readings from Last 3 Encounters:  03/04/20 99.9 kg  02/04/20 104.3 kg  01/28/20 106.4 kg     Intake/Output Summary (Last 24 hours) at 03/06/2020 1930 Last data filed at 03/06/2020 1800 Gross per 24 hour  Intake 240 ml  Output 300 ml  Net -60 ml   Physical Exam Gen:-More awake and interactive, no acute distress HEENT:- [place].AT, No sclera icterus Neck-Supple Neck,No JVD,.  Lungs-  CTAB , fair symmetrical air movement CV- S1, S2 normal, regular  Abd-  +ve B.Sounds, Abd Soft, No tenderness, no CVA area tenderness Extremity/Skin:- No  edema, pedal pulses present  Psych-affect is appropriate, oriented x3 Neuro-generalized weakness, no new focal deficits, no tremors   Data Review:   Micro Results Recent Results (from the past 240 hour(s))  Urine Culture     Status: Abnormal   Collection Time: 02/26/20 10:39 AM   Specimen: Urine  Result Value Ref Range Status   MICRO NUMBER: [id number]  Final   SPECIMEN QUALITY: Adequate  Final   Sample Source URINE  Final   STATUS: FINAL  Final   ISOLATE 1: Citrobacter koseri (A)  Final    Comment: Greater than 100,000 CFU/mL of Citrobacter koseri      Susceptibility   Citrobacter koseri - URINE CULTURE, REFLEX    AMOX/CLAVULANIC <=2 Sensitive      CEFAZOLIN* <=4 Not Reportable      * For infections other than uncomplicated UTIcaused by E. coli, K. pneumoniae or P. mirabilis:Cefazolin is resistant if MIC > or = 8 mcg/mL.(Distinguishing susceptible versus intermediatefor isolates with MIC < or = 4 mcg/mL requiresadditional testing.)For uncomplicated UTI caused by E. coli,K. pneumoniae or P. mirabilis: Cefazolin issusceptible if MIC <32  mcg/mL and predictssusceptible to the oral agents cefaclor, cefdinir,cefpodoxime, cefprozil, cefuroxime, cephalexinand loracarbef.    CEFEPIME <=1 Sensitive     CEFTRIAXONE <=1 Sensitive     CIPROFLOXACIN <=0.25 Sensitive     LEVOFLOXACIN <=0.12 Sensitive     ERTAPENEM <=0.5 Sensitive     GENTAMICIN <=1 Sensitive     IMIPENEM <=0.25 Sensitive     NITROFURANTOIN 32 Sensitive     PIP/TAZO <=4 Sensitive     TOBRAMYCIN <=1 Sensitive     TRIMETH/SULFA* <=20 Sensitive      * For infections other than uncomplicated UTIcaused by E. coli, K. pneumoniae or P. mirabilis:Cefazolin is resistant if MIC > or = 8 mcg/mL.(Distinguishing susceptible versus intermediatefor isolates with MIC < or = 4 mcg/mL requiresadditional testing.)For uncomplicated UTI caused by E. coli,K. pneumoniae or P. mirabilis: Cefazolin issusceptible if MIC <32 mcg/mL and predictssusceptible to the oral agents cefaclor, cefdinir,cefpodoxime, cefprozil, cefuroxime, cephalexinand loracarbef.Legend:S = Susceptible  I = IntermediateR = Resistant  NS = Not susceptible* = Not tested  NR = Not reported**NN = See antimicrobic comments  Blood Culture (routine x 2)     Status: None   Collection Time: 02/28/20  3:23 PM   Specimen: Right Antecubital; Blood  Result Value Ref Range Status   Specimen Description RIGHT ANTECUBITAL  Final   Special Requests   Final    BOTTLES DRAWN AEROBIC AND ANAEROBIC Blood Culture adequate volume   Culture   Final    NO GROWTH 5 DAYS Performed at [facility], [address], [place], [place] [address]    Report Status  03/04/2020 FINAL  Final  Blood Culture (routine x 2)     Status: None   Collection Time: 02/28/20  3:29 PM   Specimen: Left Antecubital; Blood  Result Value Ref Range Status   Specimen Description LEFT ANTECUBITAL  Final   Special Requests   Final    BOTTLES DRAWN AEROBIC AND ANAEROBIC Blood Culture adequate volume   Culture   Final    NO GROWTH 5 DAYS Performed at [facility], [address], [place],  [address]    Report Status 03/04/2020 FINAL  Final  SARS Coronavirus 2 by RT PCR ([facility] order, performed in [facility] lab) Nasopharyngeal Nasopharyngeal Swab     Status: None   Collection Time: 02/28/20  4:08 PM   Specimen: Nasopharyngeal Swab  Result Value Ref Range Status   SARS Coronavirus 2 NEGATIVE NEGATIVE Final    Comment: (NOTE) SARS-CoV-2 target nucleic acids are NOT DETECTED. The SARS-CoV-2 RNA is generally detectable in upper and lower respiratory specimens during the acute phase of infection. The lowest concentration of SARS-CoV-2 viral copies this assay can detect is 250 copies / mL. A negative result does not preclude SARS-CoV-2 infection and should not be used as the sole basis for treatment or other patient management decisions.  A negative result may occur with improper specimen collection / handling, submission of specimen other than nasopharyngeal swab, presence of viral mutation(s) within the areas targeted by this assay, and inadequate number of viral copies (<250 copies / mL). A negative result must be combined with clinical observations, patient history, and epidemiological information. Fact Sheet for Patients:   StrictlyIdeas.no Fact Sheet for Healthcare Providers: BankingDealers.co.za This test is not yet approved or cleared  by the [place] FDA and has been authorized for detection and/or diagnosis of SARS-CoV-2 by FDA under an Emergency Use Authorization (EUA).  This EUA will  remain in effect (meaning this test  can be used) for the duration of the COVID-19 declaration under Section 564(b)(1) of the Act, 21 U.S.C. section 360bbb-3(b)(1), unless the authorization is terminated or revoked sooner. Performed at [facility], [address] [place], [place] [address]   Urine culture     Status: Abnormal   Collection Time: 02/28/20  5:40 PM   Specimen: In/Out Cath Urine  Result Value Ref Range Status   Specimen Description   Final    IN/OUT CATH URINE Performed at [facility], [address], [place], [place] [address]    Special Requests   Final    NONE Performed at [facility], [address], [place], [place] [address]    Culture 20,000 COLONIES/mL ENTEROCOCCUS FAECALIS (A)  Final   Report Status 03/01/2020 FINAL  Final   Organism ID, Bacteria ENTEROCOCCUS FAECALIS (A)  Final      Susceptibility   Enterococcus faecalis - MIC*    AMPICILLIN <=2 SENSITIVE Sensitive     NITROFURANTOIN 64 INTERMEDIATE Intermediate     VANCOMYCIN 2 SENSITIVE Sensitive     * 20,000 COLONIES/mL ENTEROCOCCUS FAECALIS    Radiology Reports CT ABDOMEN PELVIS WO CONTRAST  Result Date: 02/21/2020 CLINICAL DATA:  Diarrhea with weight loss.  Diabetes. EXAM: CT ABDOMEN AND PELVIS WITHOUT CONTRAST TECHNIQUE: Multidetector CT imaging of the abdomen and pelvis was performed following the standard protocol without IV contrast. COMPARISON:  None. FINDINGS: Lower chest: Lung bases demonstrate linear scarring over the left lower lobe. There is a 5 x 11 mm nodule over the right lower lobe as well as a 4 mm subpleural nodule over the right middle lobe. Hepatobiliary: Suggestion of several indistinct hypodense liver lesions with the best defined over the posterior right lobe measuring 3.1 cm. Possible 1.6 cm hyperdense nodule over the dome of the medial segment left lobe of the liver. Gallbladder and biliary tree are unremarkable. Pancreas: Prominence with minimal associated calcification over the  body of the pancreas. Spleen: Normal. Adrenals/Urinary Tract: Adrenal glands are normal. Kidneys are normal in size without hydronephrosis or nephrolithiasis. No focal renal mass. Ureters are normal. Single focus of air over the anterior bladder. Stomach/Bowel: Stomach and small bowel are normal. Appendix is normal. Colon is unremarkable. Vascular/Lymphatic: Minimal calcified plaque over the abdominal aorta which is normal in caliber. Few small nonspecific lymph nodes over the upper abdomen over the gastrohepatic ligament and Peri pancreatic region. Reproductive: Uterus is right of midline.  Ovaries are normal. Other: No free fluid or focal inflammatory change. Musculoskeletal: Mild degenerative changes of the spine and hips. IMPRESSION: 1.  No acute findings in the abdomen/pelvis. 2. Focal prominence with associated calcification over the body of the pancreas. Few small nonspecific adjacent lymph nodes. Recommend further evaluation with MRI. 3. A few indistinct hypodensities over the liver with the best defined over the posterior right lobe measuring 3.1 cm. Findings are indeterminate as metastatic disease is possible. This could also be further evaluated with MRI. 4. Two nodules over the right mid to lower lung with the larger over the lateral right lower lobe measuring 8 mm. Recommend chest CT with contrast for complete evaluation of the lungs and thorax. 5.  Aortic Atherosclerosis (ICD10-I70.0). 6. Tiny focus of air over the anterior bladder which may be due to recent instrumentation although can be seen due to infection or enteric fistula. Electronically Signed   By: Marin Olp M.D.   On: 02/21/2020 16:38   CT CHEST WO CONTRAST  Result Date: 02/28/2020 CLINICAL DATA:  Respiratory failure.  Pulmonary nodules EXAM: CT CHEST WITHOUT CONTRAST TECHNIQUE: Multidetector CT imaging of the chest was performed following the standard protocol without IV contrast. COMPARISON:  CT abdomen 02/21/2020 FINDINGS:  Cardiovascular: Heart is normal size. Aorta is normal caliber. Aortic atherosclerosis. Mediastinum/Nodes: No mediastinal, hilar, or axillary adenopathy. Lungs/Pleura: 13 mm spiculated nodule in the left upper lobe on image 28. Right lower lobe nodule as seen on prior abdominal CT measures 8 mm on image 64. Small subpleural nodule in the right middle lobe measures 4 mm. Mild elevation of the right hemidiaphragm. Bibasilar atelectasis or scarring. No effusions. Upper Abdomen: Ill-defined hypodensities again noted in the liver, best seen in the posterior right hepatic lobe measuring approximately 3.3 cm, similar to prior abdominal CT. Musculoskeletal: Chest wall soft tissues are unremarkable. No acute bony abnormality. IMPRESSION: Previously seen nodules in the right lower lobe and right middle lobe as seen on prior abdominal CT. There is also a spiculated 13 mm nodule in the left upper lobe. Appearance is concerning for possible primary lung cancer. Consider further evaluation with PET CT. Ill-defined low-density lesions in the liver as seen on prior abdominal CT. Cannot exclude metastatic disease. Aortic Atherosclerosis (ICD10-I70.0). Electronically Signed   By: Rolm Baptise M.D.   On: 02/28/2020 20:55   MR ABDOMEN W WO CONTRAST  Result Date: 03/04/2020 CLINICAL DATA:  EVALUATE LIVER LESIONS. EXAM: MRI ABDOMEN WITHOUT AND WITH CONTRAST TECHNIQUE: Multiplanar multisequence MR imaging of the abdomen was performed both before and after the administration of intravenous contrast. CONTRAST:  62mL GADAVIST GADOBUTROL 1 MMOL/ML IV SOLN COMPARISON:  02/28/2020 FINDINGS: Lower chest: Subpleural consolidation noted in the right lung base. Hepatobiliary: There are multifocal rim enhancing lesions involving both lobes of liver suspicious for metastatic disease. -Index lesion within segment 7/6 measures 5.2 by 3.2 cm, image 22/19. -Index lesion in segment 2 of left lobe of liver measures 5.9 x 4.3 cm, image 15/9. -Segment 3  index lesion measures 2.7 by 2.5 cm lesion, image 24/19. -Segment 8/4 index lesion measures 4.0 x 3.8 cm, image 11/19. Tiny stones versus sludge noted within the dependent portion of the gallbladder. No significant gallbladder wall thickening or inflammation. No bile duct dilatation. Pancreas: There is a hypoenhancing mass involving the body and tail of pancreas which measures approximately 9.0 x 3.7 cm, image 34/17. This is highly concerning for primary pancreatic adenocarcinoma. Tumor encasement of the superior mesenteric artery is identified, image 38/21. The celiac artery is not well seen and may also be encased by tumor. Difficult to exclude involvement of the portal venous confluence due to motion artifact. Spleen:  Within normal limits in size and appearance. Adrenals/Urinary Tract: Normal adrenal glands. No kidney mass or hydronephrosis. Stomach/Bowel: Visualized portions within the abdomen are unremarkable. Vascular/Lymphatic: Normal appearance of the abdominal aorta. Within the limitations of motion artifact no adenopathy identified within the upper abdomen. Other:  No free fluid or fluid collections. Musculoskeletal: No suspicious bone lesions identified. IMPRESSION: 1. Large hypoenhancing mass involving the body and tail of pancreas is highly concerning for primary pancreatic adenocarcinoma. There is evidence for encasement of the superior mesenteric artery and possible involvement of the celiac trunk and portal venous confluence. 2. Multifocal rim enhancing lesions involving both lobes of liver suspicious for metastatic disease. Electronically Signed   By: Kerby Moors M.D.   On: 03/04/2020 14:15   MR 3D Recon At Scanner  Result Date: 03/04/2020 CLINICAL DATA:  EVALUATE LIVER LESIONS. EXAM: MRI ABDOMEN WITHOUT AND WITH CONTRAST TECHNIQUE: Multiplanar multisequence MR imaging  of the abdomen was performed both before and after the administration of intravenous contrast. CONTRAST:  55mL GADAVIST  GADOBUTROL 1 MMOL/ML IV SOLN COMPARISON:  02/28/2020 FINDINGS: Lower chest: Subpleural consolidation noted in the right lung base. Hepatobiliary: There are multifocal rim enhancing lesions involving both lobes of liver suspicious for metastatic disease. -Index lesion within segment 7/6 measures 5.2 by 3.2 cm, image 22/19. -Index lesion in segment 2 of left lobe of liver measures 5.9 x 4.3 cm, image 15/9. -Segment 3 index lesion measures 2.7 by 2.5 cm lesion, image 24/19. -Segment 8/4 index lesion measures 4.0 x 3.8 cm, image 11/19. Tiny stones versus sludge noted within the dependent portion of the gallbladder. No significant gallbladder wall thickening or inflammation. No bile duct dilatation. Pancreas: There is a hypoenhancing mass involving the body and tail of pancreas which measures approximately 9.0 x 3.7 cm, image 34/17. This is highly concerning for primary pancreatic adenocarcinoma. Tumor encasement of the superior mesenteric artery is identified, image 38/21. The celiac artery is not well seen and may also be encased by tumor. Difficult to exclude involvement of the portal venous confluence due to motion artifact. Spleen:  Within normal limits in size and appearance. Adrenals/Urinary Tract: Normal adrenal glands. No kidney mass or hydronephrosis. Stomach/Bowel: Visualized portions within the abdomen are unremarkable. Vascular/Lymphatic: Normal appearance of the abdominal aorta. Within the limitations of motion artifact no adenopathy identified within the upper abdomen. Other:  No free fluid or fluid collections. Musculoskeletal: No suspicious bone lesions identified. IMPRESSION: 1. Large hypoenhancing mass involving the body and tail of pancreas is highly concerning for primary pancreatic adenocarcinoma. There is evidence for encasement of the superior mesenteric artery and possible involvement of the celiac trunk and portal venous confluence. 2. Multifocal rim enhancing lesions involving both lobes of  liver suspicious for metastatic disease. Electronically Signed   By: Kerby Moors M.D.   On: 03/04/2020 14:15   DG Chest Port 1 View  Result Date: 02/28/2020 CLINICAL DATA:  Shortness of breath. EXAM: PORTABLE CHEST 1 VIEW COMPARISON:  Chest x-ray dated July 19, 2018. FINDINGS: The heart size and mediastinal contours are within normal limits. Pulmonary vascular congestion with chronically coarse interstitial markings. New 1.5 cm nodule in the left upper lobe. No focal consolidation, pleural effusion, or pneumothorax. No acute osseous abnormality. IMPRESSION: 1. New 1.5 cm nodule in the left upper lobe. Chest CT is recommended for further evaluation. 2. Mild pulmonary vascular congestion. Electronically Signed   By: Titus Dubin M.D.   On: 02/28/2020 15:49   ECHOCARDIOGRAM COMPLETE  Result Date: 02/29/2020    ECHOCARDIOGRAM REPORT   Patient Name:   CATERA HANKINS Date of Exam: 02/29/2020 Medical Rec #:  [id number]           Height:       64.0 in Accession #:    [id number]          Weight:       223.2 lb Date of Birth:  [birth year]           BSA:          2.050 m Patient Age:    35 years            BP:           105/74 mmHg Patient Gender: F                   HR:           73 bpm. Exam  Location:  [facility] Procedure: 2D Echo Indications:    Congestive Heart Failure 428.0 / I50.9  History:        Patient has prior history of Echocardiogram examinations, most                 recent 06/11/2013. CHF, Stroke; Risk Factors:Hypertension,                 Dyslipidemia, Diabetes and Former Smoker. LVH.  Sonographer:    Leavy Cella RDCS (AE) Referring Phys: [place]  1. Left ventricular ejection fraction, by estimation, is 65 to 70%. The left ventricle has normal function. The left ventricle has no regional wall motion abnormalities. There is mild left ventricular hypertrophy. Left ventricular diastolic parameters are consistent with Grade I diastolic dysfunction (impaired  relaxation).  2. Right ventricular systolic function is low normal. The right ventricular size is mildly enlarged. There is mildly elevated pulmonary artery systolic pressure.  3. The mitral valve is normal in structure. No evidence of mitral valve regurgitation. No evidence of mitral stenosis.  4. The aortic valve is tricuspid. Aortic valve regurgitation is not visualized. No aortic stenosis is present. FINDINGS  Left Ventricle: Left ventricular ejection fraction, by estimation, is 65 to 70%. The left ventricle has normal function. The left ventricle has no regional wall motion abnormalities. The left ventricular internal cavity size was normal in size. There is  mild left ventricular hypertrophy. Left ventricular diastolic parameters are consistent with Grade I diastolic dysfunction (impaired relaxation). Normal left ventricular filling pressure. Right Ventricle: The right ventricular size is mildly enlarged. Right vetricular wall thickness was not assessed. Right ventricular systolic function is low normal. There is mildly elevated pulmonary artery systolic pressure. The tricuspid regurgitant velocity is 2.86 m/s, and with an assumed right atrial pressure of 10 mmHg, the estimated right ventricular systolic pressure is 95.6 mmHg. Left Atrium: Left atrial size was normal in size. Right Atrium: Right atrial size was not well visualized. Pericardium: There is no evidence of pericardial effusion. Mitral Valve: The mitral valve is normal in structure. No evidence of mitral valve regurgitation. No evidence of mitral valve stenosis. Tricuspid Valve: The tricuspid valve is normal in structure. Tricuspid valve regurgitation is mild . No evidence of tricuspid stenosis. Aortic Valve: The aortic valve is tricuspid. Aortic valve regurgitation is not visualized. No aortic stenosis is present. Aortic valve mean gradient measures 4.0 mmHg. Aortic valve peak gradient measures 7.4 mmHg. Aortic valve area, by VTI measures 2.61 cm.  Pulmonic Valve: The pulmonic valve was not well visualized. Pulmonic valve regurgitation is not visualized. No evidence of pulmonic stenosis. Aorta: The aortic root is normal in size and structure. Pulmonary Artery: Indeterminant PASP, IVC poorly visualized. Venous: The inferior vena cava was not well visualized. IAS/Shunts: The interatrial septum was not well visualized.  LEFT VENTRICLE PLAX 2D LVIDd:         4.15 cm  Diastology LVIDs:         1.76 cm  LV e' lateral:   8.38 cm/s LV PW:         1.12 cm  LV E/e' lateral: 4.3 LV IVS:        1.17 cm  LV e' medial:    5.66 cm/s LVOT diam:     2.00 cm  LV E/e' medial:  6.4 LV SV:         55 LV SV Index:   27 LVOT Area:     3.14 cm  RIGHT VENTRICLE RV S prime:     8.05 cm/s TAPSE (M-mode): 1.6 cm LEFT ATRIUM           Index       RIGHT ATRIUM           Index LA diam:      2.10 cm 1.02 cm/m  RA Area:     13.30 cm LA Vol (A2C): 21.8 ml 10.64 ml/m RA Volume:   36.20 ml  17.66 ml/m LA Vol (A4C): 32.1 ml 15.66 ml/m  AORTIC VALVE AV Area (Vmax):    2.37 cm AV Area (Vmean):   2.25 cm AV Area (VTI):     2.61 cm AV Vmax:           135.74 cm/s AV Vmean:          96.225 cm/s AV VTI:            0.210 m AV Peak Grad:      7.4 mmHg AV Mean Grad:      4.0 mmHg LVOT Vmax:         102.35 cm/s LVOT Vmean:        68.803 cm/s LVOT VTI:          0.174 m LVOT/AV VTI ratio: 0.83  AORTA Ao Root diam: 3.00 cm MITRAL VALVE               TRICUSPID VALVE MV Area (PHT): 1.98 cm    TR Peak grad:   32.7 mmHg MV Decel Time: 384 msec    TR Vmax:        286.00 cm/s MV E velocity: 36.40 cm/s MV A velocity: 75.20 cm/s  SHUNTS MV E/A ratio:  0.48        Systemic VTI:  0.17 m                            Systemic Diam: 2.00 cm Carlyle Dolly MD Electronically signed by Carlyle Dolly MD Signature Date/Time: 02/29/2020/10:45:25 AM    Final      CBC Recent Labs  Lab 03/02/20 0405 03/03/20 0506 03/05/20 0546  WBC 10.8* 10.3 7.9  HGB 11.4* 11.7* 11.5*  HCT 37.3 37.8 37.1  PLT 181 180 189  MCV  86.3 87.1 84.9  MCH 26.4 27.0 26.3  MCHC 30.6 31.0 31.0  RDW 16.7* 16.8* 16.4*    Chemistries  Recent Labs  Lab 02/29/20 0551 03/02/20 0405 03/03/20 0506 03/05/20 0546  NA 141 144 145 142  K 4.1 4.0 4.1 3.3*  CL 112* 117* 117* 113*  CO2 20* 19* 20* 20*  GLUCOSE 271* 185* 181* 65*  BUN 49* 30* 26* 19  CREATININE 2.86* 1.96* 1.75* 1.51*  CALCIUM 8.2* 8.8* 9.0 9.1  AST  --  14* 15  --   ALT  --  13 12  --   ALKPHOS  --  70 66  --   BILITOT  --  0.4 0.2*  --    ------------------------------------------------------------------------------------------------------------------ No results for input(s): CHOL, HDL, LDLCALC, TRIG, CHOLHDL, LDLDIRECT in the last 72 hours.  Lab Results  Component Value Date   HGBA1C 7.4 (A) 01/28/2020   ------------------------------------------------------------------------------------------------------------------ No results for input(s): TSH, T4TOTAL, T3FREE, THYROIDAB in the last 72 hours.  Invalid input(s): FREET3 ------------------------------------------------------------------------------------------------------------------ No results for input(s): VITAMINB12, FOLATE, FERRITIN, TIBC, IRON, RETICCTPCT in the last 72 hours.  Coagulation profile No results for input(s): INR, PROTIME in the last 168 hours.  No  results for input(s): DDIMER in the last 72 hours.  Cardiac Enzymes No results for input(s): CKMB, TROPONINI, MYOGLOBIN in the last 168 hours.  Invalid input(s): CK ------------------------------------------------------------------------------------------------------------------    Component Value Date/Time   BNP 356.0 (H) 02/28/2020 1522   Roxan Hockey M.D on 03/06/2020 at 7:30 PM  Go to [email or web address] - for contact info  [facility] - Office  [phone]

## 2020-03-06 NOTE — Progress Notes (Signed)
Subjective: Feels well today. Denies abdominal pain, N/V. No bowel movements yet today. Overall she feels like her diarrhea has improved a lot. No other GI complaints.  Objective: Vital signs in last 24 hours: Temp:  [98.7 F (37.1 C)-98.9 F (37.2 C)] 98.9 F (37.2 C) (06/09 2047) Pulse Rate:  [70-72] 72 (06/09 2047) Resp:  [18-20] 20 (06/09 2047) BP: (120-129)/(75-77) 129/77 (06/09 2047) SpO2:  [93 %-95 %] 93 % (06/09 2047) Last BM Date: 03/05/20 General:   Alert and oriented, pleasant Head:  Normocephalic and atraumatic. Eyes:  No icterus, sclera clear. Conjuctiva pink.   Heart:  S1, S2 present, no murmurs noted.  Lungs: Clear to auscultation bilaterally, without wheezing, rales, or rhonchi.  Abdomen:  Bowel sounds present, soft, non-tender, non-distended. No HSM or hernias noted. No rebound or guarding. Msk:  Symmetrical without gross deformities. Pulses:  Normal bilateral DP pulses noted. Extremities:  Without clubbing or edema. Neurologic:  Alert and  oriented x4;  grossly normal neurologically. Psych:  Alert and cooperative. Normal mood and affect.  Intake/Output from previous day: 06/09 0701 - 06/10 0700 In: 743.6 [P.O.:480; I.V.:163.6; IV Piggyback:100] Out: 300 [Urine:300] Intake/Output this shift: No intake/output data recorded.  Lab Results: Recent Labs    03/05/20 0546  WBC 7.9  HGB 11.5*  HCT 37.1  PLT 189   BMET Recent Labs    03/05/20 0546  NA 142  K 3.3*  CL 113*  CO2 20*  GLUCOSE 65*  BUN 19  CREATININE 1.51*  CALCIUM 9.1   LFT No results for input(s): PROT, ALBUMIN, AST, ALT, ALKPHOS, BILITOT, BILIDIR, IBILI in the last 72 hours. PT/INR No results for input(s): LABPROT, INR in the last 72 hours. Hepatitis Panel No results for input(s): HEPBSAG, HCVAB, HEPAIGM, HEPBIGM in the last 72 hours.   Studies/Results: MR ABDOMEN W WO CONTRAST  Result Date: 03/04/2020 CLINICAL DATA:  EVALUATE LIVER LESIONS. EXAM: MRI ABDOMEN WITHOUT AND  WITH CONTRAST TECHNIQUE: Multiplanar multisequence MR imaging of the abdomen was performed both before and after the administration of intravenous contrast. CONTRAST:  63mL GADAVIST GADOBUTROL 1 MMOL/ML IV SOLN COMPARISON:  02/28/2020 FINDINGS: Lower chest: Subpleural consolidation noted in the right lung base. Hepatobiliary: There are multifocal rim enhancing lesions involving both lobes of liver suspicious for metastatic disease. -Index lesion within segment 7/6 measures 5.2 by 3.2 cm, image 22/19. -Index lesion in segment 2 of left lobe of liver measures 5.9 x 4.3 cm, image 15/9. -Segment 3 index lesion measures 2.7 by 2.5 cm lesion, image 24/19. -Segment 8/4 index lesion measures 4.0 x 3.8 cm, image 11/19. Tiny stones versus sludge noted within the dependent portion of the gallbladder. No significant gallbladder wall thickening or inflammation. No bile duct dilatation. Pancreas: There is a hypoenhancing mass involving the body and tail of pancreas which measures approximately 9.0 x 3.7 cm, image 34/17. This is highly concerning for primary pancreatic adenocarcinoma. Tumor encasement of the superior mesenteric artery is identified, image 38/21. The celiac artery is not well seen and may also be encased by tumor. Difficult to exclude involvement of the portal venous confluence due to motion artifact. Spleen:  Within normal limits in size and appearance. Adrenals/Urinary Tract: Normal adrenal glands. No kidney mass or hydronephrosis. Stomach/Bowel: Visualized portions within the abdomen are unremarkable. Vascular/Lymphatic: Normal appearance of the abdominal aorta. Within the limitations of motion artifact no adenopathy identified within the upper abdomen. Other:  No free fluid or fluid collections. Musculoskeletal: No suspicious bone lesions identified. IMPRESSION: 1.  Large hypoenhancing mass involving the body and tail of pancreas is highly concerning for primary pancreatic adenocarcinoma. There is evidence for  encasement of the superior mesenteric artery and possible involvement of the celiac trunk and portal venous confluence. 2. Multifocal rim enhancing lesions involving both lobes of liver suspicious for metastatic disease. Electronically Signed   By: Kerby Moors M.D.   On: 03/04/2020 14:15   MR 3D Recon At Scanner  Result Date: 03/04/2020 CLINICAL DATA:  EVALUATE LIVER LESIONS. EXAM: MRI ABDOMEN WITHOUT AND WITH CONTRAST TECHNIQUE: Multiplanar multisequence MR imaging of the abdomen was performed both before and after the administration of intravenous contrast. CONTRAST:  3mL GADAVIST GADOBUTROL 1 MMOL/ML IV SOLN COMPARISON:  02/28/2020 FINDINGS: Lower chest: Subpleural consolidation noted in the right lung base. Hepatobiliary: There are multifocal rim enhancing lesions involving both lobes of liver suspicious for metastatic disease. -Index lesion within segment 7/6 measures 5.2 by 3.2 cm, image 22/19. -Index lesion in segment 2 of left lobe of liver measures 5.9 x 4.3 cm, image 15/9. -Segment 3 index lesion measures 2.7 by 2.5 cm lesion, image 24/19. -Segment 8/4 index lesion measures 4.0 x 3.8 cm, image 11/19. Tiny stones versus sludge noted within the dependent portion of the gallbladder. No significant gallbladder wall thickening or inflammation. No bile duct dilatation. Pancreas: There is a hypoenhancing mass involving the body and tail of pancreas which measures approximately 9.0 x 3.7 cm, image 34/17. This is highly concerning for primary pancreatic adenocarcinoma. Tumor encasement of the superior mesenteric artery is identified, image 38/21. The celiac artery is not well seen and may also be encased by tumor. Difficult to exclude involvement of the portal venous confluence due to motion artifact. Spleen:  Within normal limits in size and appearance. Adrenals/Urinary Tract: Normal adrenal glands. No kidney mass or hydronephrosis. Stomach/Bowel: Visualized portions within the abdomen are unremarkable.  Vascular/Lymphatic: Normal appearance of the abdominal aorta. Within the limitations of motion artifact no adenopathy identified within the upper abdomen. Other:  No free fluid or fluid collections. Musculoskeletal: No suspicious bone lesions identified. IMPRESSION: 1. Large hypoenhancing mass involving the body and tail of pancreas is highly concerning for primary pancreatic adenocarcinoma. There is evidence for encasement of the superior mesenteric artery and possible involvement of the celiac trunk and portal venous confluence. 2. Multifocal rim enhancing lesions involving both lobes of liver suspicious for metastatic disease. Electronically Signed   By: Kerby Moors M.D.   On: 03/04/2020 14:15    Assessment: 69 year old female, resident of [facility], admitted with acute renal injury in setting of persistent diarrhea, UTI(currently being treated with rocephin), diastolic heart failure, acute respiratory failure, with GI consult requested due to diarrhea that has now persisted over 2 months.   Persistent diarrhea:Outpatient evaluation mid May 2021 with negative Cdiff, GI pathogen panel, normal celiac serologies, normal TSH. CT without IV contrast showed concerning findings including focal prominence with associated calcification over pancreas body, indistinct hypodensities of liver, lung nodules. She was started on pancreatic enzymes and scheduled for MRI.  MRI abdomen 03/04/2020 with findings suggestive of primary pancreatic adenocarcinoma with liver mets. Suspect this is the etiology of her diarrhea. Last TCS November 2019 with tubular adenomas. Pancreatic enzyme dosing was increased on 6/7 and seemed to help initially. Notably, she is on antibiotics for UTI at this time which could worsen diarrhea and increase risk for C. Diff. Yesterday she had at least 4 stools (which she feels is improved). No bowel movement yet today.  Presumed metastatic stage  IV pancreatic cancer with liver mets: MRI/MRCP  6/8 with large hypoenhancing mass involving the body and tail of pancreasis highly concerning for primary pancreatic adenocarcinoma. There is evidence for encasement of the superior mesenteric artery and possible involvement of the celiac trunk and portal venous confluence.Multifocal rim enhancing lesions involving both lobes of liver suspicious for metastatic disease. This is likely the cause of her clinical decline over the last few months with diarrhea, decreased appetite, and weight loss. Dr. Denton Brick discussed findings with patient and her sister; palliative care was consulted and the patient elected DNR. She is hoping to return to the group home, however will need to be ambulatory. Leaning toward hospice care and inpatient hospice facility could be helpful for when patient becomes non-ambulatory. Today she is feeling well, no symptoms other than persistent diarrhea.  Plan: 1. Monitor her number of stools today to see if medication adjustments are needed 2. If needed, can Increase Creon to next step: 108,000 units/meal and 36,000 units/snack (NOTE: Max dose is 2,500 units/kg/meal or 250,000 units/meal based on most recent weight 99.9 kg) 3. Continue Imodium prn 4. Can add dicyclomine tid ac if needed 5. Continue lactose free 6. Continue probiotic 7. Consider CDiff if acute worsening of diarrhea (due to UTI abx) 8. Supportive care 9. We will sign off. Please don't hesitate to contact us if further assistance is needed   Thank you for allowing [place] to participate in the care of [place], DNP, AGNP-C Adult & Gerontological Nurse Practitioner [facility]     LOS: 7 days    03/06/2020, 8:16 AM

## 2020-03-06 NOTE — Evaluation (Signed)
Physical Therapy Evaluation Patient Details Name: Kathryn Cook MRN: [id number] DOB: 25-Dec-1950 Today's Date: 03/06/2020   History of Present Illness  69 y.o. female with medical history significant for diastolic CHF, CKD, diabetes mellitus, dementia, CVA, hypertension admitted on 02/28/20 with AKI in the setting of diarrhea.   Clinical Impression  Pt admitted with above diagnosis. Patient requires additional time with all mobility and at times needs cues to continue or initiate movement. Patient supervision to min guard for all out of bed activities. Patient reaching for external support when ambulating without an assistive device. Patient more steady and safe with use of RW and min guard to supervision. Patient required verbal and gestural cues to sequencing of steps and placement of hands, especially when pivoting to go back to bed from ambulating. Pt currently with functional limitations due to the deficits listed below (see PT Problem List). Pt will benefit from skilled PT to increase their independence and safety with mobility to allow discharge to the venue listed below.       Follow Up Recommendations Home health PT;Supervision - Intermittent;Supervision for mobility/OOB    Equipment Recommendations  Rolling walker with 5" wheels    Recommendations for Other Services       Precautions / Restrictions Precautions Precautions: Fall Restrictions Weight Bearing Restrictions: No      Mobility  Bed Mobility Overal bed mobility: Needs Assistance Bed Mobility: Supine to Sit;Sit to Supine     Supine to sit: Supervision Sit to supine: Supervision   General bed mobility comments: and increased time and cues to initiate movement  Transfers Overall transfer level: Needs assistance Equipment used: None;Rolling walker (2 wheeled) Transfers: Sit to/from [employer] Transfers Sit to Stand: Supervision;Min guard Stand pivot transfers: Min guard;Supervision       General  transfer comment: without assistive device - supervision to min guard for safety; with RW - min guard for safety and cues for sequencing of steps and placement of hands  Ambulation/Gait Ambulation/Gait assistance: Min guard;Supervision Gait Distance (Feet): 100 Feet Assistive device: Rolling walker (2 wheeled);None Gait Pattern/deviations: Step-through pattern;Decreased step length - right;Decreased step length - left;Decreased stride length;Shuffle Gait velocity: decreased   General Gait Details: slow, labored gait with short equal length steps; without assistive device, reaches for external support; with RW - min guard for safety and more stable; limited by fatigue.  Stairs            Wheelchair Mobility    Modified Rankin (Stroke Patients Only)       Balance Overall balance assessment: Needs assistance Sitting-balance support: Bilateral upper extremity supported;Feet supported Sitting balance-Leahy Scale: Good     Standing balance support: No upper extremity supported;During functional activity Standing balance-Leahy Scale: Poor Standing balance comment: poor without assistive device; fair with RW                             Pertinent Vitals/Pain Pain Assessment: No/denies pain    Home Living   Living Arrangements: Group Home Available Help at Discharge: Available 24 hours/day Type of Home: Group Home Home Access: Ramped entrance     Home Layout: One level Home Equipment: Shower seat;Grab bars - tub/shower;Grab bars - toilet;Hand held shower head      Prior Function Level of Independence: Needs assistance   Gait / Transfers Assistance Needed: per patient, nursing facility distances without an assistive device but holds onto hallway railings.  ADL's / Homemaking Assistance Needed: Independent for  BADLs; assistance for IADLs        Hand Dominance   Dominant Hand: Right    Extremity/Trunk Assessment   Upper Extremity Assessment Upper  Extremity Assessment: Generalized weakness    Lower Extremity Assessment Lower Extremity Assessment: Generalized weakness    Cervical / Trunk Assessment Cervical / Trunk Assessment: Normal  Communication   Communication: No difficulties  Cognition Arousal/Alertness: Awake/alert Behavior During Therapy: WFL for tasks assessed/performed Overall Cognitive Status: Within Functional Limits for tasks assessed                                        General Comments      Exercises     Assessment/Plan    PT Assessment Patient needs continued PT services  PT Problem List Decreased strength;Decreased mobility;Decreased activity tolerance;Decreased balance;Decreased knowledge of use of DME       PT Treatment Interventions DME instruction;Therapeutic activities;Gait training;Therapeutic exercise;Patient/family education;Balance training    PT Goals (Current goals can be found in the Care Plan section)  Acute Rehab PT Goals Patient Stated Goal: Go back to nursing facility. PT Goal Formulation: With patient Time For Goal Achievement: 03/20/20 Potential to Achieve Goals: Good    Frequency Min 3X/week   Barriers to discharge        Co-evaluation               AM-PAC PT "6 Clicks" Mobility  Outcome Measure Help needed turning from your back to your side while in a flat bed without using bedrails?: A Little Help needed moving from lying on your back to sitting on the side of a flat bed without using bedrails?: A Little Help needed moving to and from a bed to a chair (including a wheelchair)?: A Little Help needed standing up from a chair using your arms (e.g., wheelchair or bedside chair)?: A Little Help needed to walk in hospital room?: A Little Help needed climbing 3-5 steps with a railing? : A Lot 6 Click Score: 17    End of Session Equipment Utilized During Treatment: Gait belt Activity Tolerance: Patient tolerated treatment well;Patient limited by  fatigue Patient left: in bed;with call bell/phone within reach Nurse Communication: Mobility status PT Visit Diagnosis: Unsteadiness on feet (R26.81);Other abnormalities of gait and mobility (R26.89);Muscle weakness (generalized) (M62.81)    Time: 0947-0962 PT Time Calculation (min) (ACUTE ONLY): 30 min   Charges:   PT Evaluation $PT Eval Low Complexity: 1 Low PT Treatments $Gait Training: 8-22 mins        Floria Raveling. Hartnett-Rands, MS, PT [facility] #[id number] 03/06/2020, 12:07 PM

## 2020-03-06 NOTE — TOC Progression Note (Signed)
Transition of Care [facility]) - Progression Note    Patient Details  Name: Kathryn Cook MRN: [id number] Date of Birth: 08-19-1951  [facility] of [facility]) CM/SW Contact  Boneta Lucks, RN Phone Number: 03/06/2020, 4:25 PM  Clinical Narrative:   Patient admitted for pancreatic mass. Patient lives at [facility]. Palliative consult complete with family and facility. ALF is willing to take patient back with hospice. Requesting [place] hospice in case the need to go to [place] later. Referral sent to [facility]. TOC to follow.     Expected Discharge Plan: Assisted Living (with [facility]) Barriers to Discharge: Other (comment) (late hospice referral)  Expected Discharge Plan and Services Expected Discharge Plan: Assisted Living (with [facility]) In-house Referral: Clinical Social Work   Post Acute Care Choice: Resumption of Svcs/PTA Provider Living arrangements for the past 2 months:  (Family Care home)

## 2020-03-06 NOTE — Plan of Care (Signed)
  Problem: Acute Rehab PT Goals(only PT should resolve) Goal: Pt Will Go Supine/Side To Sit Outcome: Progressing Flowsheets (Taken 03/06/2020 1211) Pt will go Supine/Side to Sit: with supervision Goal: Pt Will Go Sit To Supine/Side Outcome: Progressing Flowsheets (Taken 03/06/2020 1211) Pt will go Sit to Supine/Side: with supervision Goal: Patient Will Transfer Sit To/From Stand Outcome: Progressing Flowsheets (Taken 03/06/2020 1211) Patient will transfer sit to/from stand: with supervision Goal: Pt Will Transfer Bed To Chair/Chair To Bed Outcome: Progressing Flowsheets (Taken 03/06/2020 1211) Pt will Transfer Bed to Chair/Chair to Bed: with supervision Note: Verbal cues for sequencing of steps and placement of hands Goal: Pt Will Ambulate Outcome: Progressing Flowsheets (Taken 03/06/2020 1211) Pt will Ambulate:  100 feet  with supervision  with rolling walker   Pamala Hurry D. Hartnett-Rands, MS, PT Per [place] [phone] 6/10/201

## 2020-03-07 DIAGNOSIS — Z09 Encounter for follow-up examination after completed treatment for conditions other than malignant neoplasm: Secondary | ICD-10-CM

## 2020-03-07 LAB — BASIC METABOLIC PANEL
Anion gap: 5 (ref 5–15)
BUN: 19 mg/dL (ref 8–23)
CO2: 22 mmol/L (ref 22–32)
Calcium: 8.4 mg/dL — ABNORMAL LOW (ref 8.9–10.3)
Chloride: 115 mmol/L — ABNORMAL HIGH (ref 98–111)
Creatinine, Ser: 1.68 mg/dL — ABNORMAL HIGH (ref 0.44–1.00)
GFR calc Af Amer: 36 mL/min — ABNORMAL LOW (ref >60)
GFR calc non Af Amer: 31 mL/min — ABNORMAL LOW (ref >60)
Glucose, Bld: 120 mg/dL — ABNORMAL HIGH (ref 70–99)
Potassium: 3.6 mmol/L (ref 3.5–5.1)
Sodium: 142 mmol/L (ref 135–145)

## 2020-03-07 LAB — GLUCOSE, CAPILLARY
Glucose-Capillary: 106 mg/dL — ABNORMAL HIGH (ref 70–99)
Glucose-Capillary: 176 mg/dL — ABNORMAL HIGH (ref 70–99)

## 2020-03-07 MED ORDER — INSULIN GLARGINE 100 UNIT/ML ~~LOC~~ SOLN: 20.0000 [IU] | Freq: Every day | SUBCUTANEOUS | 11 refills | Status: AC

## 2020-03-07 MED ORDER — PANCRELIPASE (LIP-PROT-AMYL) 36000-114000 UNITS PO CPEP: 36000.0000 [IU] | ORAL_CAPSULE | ORAL | 0 refills | Status: AC

## 2020-03-07 MED ORDER — ACETAMINOPHEN 325 MG PO TABS: 650.0000 mg | ORAL_TABLET | Freq: Four times a day (QID) | ORAL | 2 refills | Status: AC | PRN

## 2020-03-07 MED ORDER — ONDANSETRON 4 MG PO TBDP
4.0000 mg | ORAL_TABLET | Freq: Three times a day (TID) | ORAL | 0 refills | Status: AC | PRN
Start: 2020-03-07 — End: ?

## 2020-03-07 MED ORDER — LOPERAMIDE HCL 2 MG PO CAPS: 4.0000 mg | ORAL_CAPSULE | Freq: Three times a day (TID) | ORAL | 3 refills | Status: AC | PRN

## 2020-03-07 MED ORDER — HYDROCODONE-ACETAMINOPHEN 7.5-325 MG/15ML PO SOLN: 15.0000 mL | Freq: Four times a day (QID) | ORAL | 0 refills | Status: AC | PRN

## 2020-03-07 MED ORDER — MEGESTROL ACETATE 400 MG/10ML PO SUSP
400.0000 mg | Freq: Two times a day (BID) | ORAL | 0 refills | Status: AC
Start: 2020-03-07 — End: ?

## 2020-03-07 MED ORDER — MIRTAZAPINE 15 MG PO TABS: 15.0000 mg | ORAL_TABLET | Freq: Every day | ORAL | 2 refills | Status: AC

## 2020-03-07 MED ORDER — DIPHENOXYLATE-ATROPINE 2.5-0.025 MG PO TABS: 1.0000 | ORAL_TABLET | Freq: Four times a day (QID) | ORAL | 1 refills | Status: AC

## 2020-03-07 MED ORDER — CARVEDILOL 3.125 MG PO TABS: 3.1250 mg | ORAL_TABLET | Freq: Two times a day (BID) | ORAL | 1 refills | Status: AC

## 2020-03-07 MED ORDER — NOVOLOG FLEXPEN 100 UNIT/ML ~~LOC~~ SOPN: 0.0000 [IU] | PEN_INJECTOR | SUBCUTANEOUS | 0 refills | Status: AC

## 2020-03-07 MED ORDER — PANCRELIPASE (LIP-PROT-AMYL) 36000-114000 UNITS PO CPEP: 108000.0000 [IU] | ORAL_CAPSULE | Freq: Three times a day (TID) | ORAL | 2 refills | Status: AC

## 2020-03-07 NOTE — Discharge Summary (Signed)
Kathryn Cook, is a 69 y.o. female  DOB [birth year]  MRN [id number].  Admission date:  02/28/2020  Admitting Physician  Bethena Roys, MD  Discharge Date:  03/07/2020   Primary MD  [name], MD  Recommendations for primary care physician for things to follow:   Follow-up with hospice to help enhance comfort and dignity for patient  Admission Diagnosis  AKI (acute kidney injury) ([place]) [N17.9]  Discharge Diagnosis  AKI (acute kidney injury) ([place]) [N17.9]    Principal Problem:   Pancreatic mass/-Presumed metastatic stage IV pancreatic cancer with liver mets Active Problems:   HTN (hypertension)   Kidney disease, chronic, stage III (GFR 30-59 ml/min)   Diastolic congestive heart failure, NYHA class 1 (HCC)   H/O: CVA (cerebrovascular accident)   Dementia ([place])   Type II diabetes mellitus with neurological manifestations ([place])   Essential hypertension, benign   Diarrhea   AKI (acute kidney injury) ([place])   DNR (do not resuscitate)   Palliative care by specialist   Abnormal CT scan   Hospital discharge follow-up      Past Medical History:  Diagnosis Date  . Bronchitis   . CVA (cerebral infarction)   . Diabetes mellitus without complication ([place])   . Hyperlipidemia   . Hypertension   . Memory loss   . Stroke ([place])   . Substance abuse ([place])    alcohol, but patient states she doesn't drink that much anymore    Past Surgical History:  Procedure Laterality Date  . COLONOSCOPY N/A 08/22/2018   two 4-5 mm polyps in descending colon and at IC valve. Tubular adenomas.   Marland Kitchen POLYPECTOMY  08/22/2018   Procedure: POLYPECTOMY;  Surgeon: Daneil Dolin, MD;  Location: [facility];  Service: Endoscopy;;  colon     HPI  from the history and physical done on the day of admission:   Chief Complaint: Abnormal labs, diarrhea, confusion.  HPI: Kathryn Cook is a 69 y.o. female  with medical history significant for diastolic CHF, CKD, diabetes mellitus, dementia, CVA, hypertension. Patient was sent to the ED from nursing home with reports of worsening renal function, creatinine checked 2 days ago was 2.88.  Also with reported poor p.o. intake, loose stools for 6 weeks. She reports having on average 2-3 bowel movements a day, basically anytime she eats something it just runs through her. Patient had urine culture drawn, resulted yesterday, grew Citrobacter koseri.  Patient has not been started on antibiotics yet for her UTI, she was sent to the ED. On EMS arrival patient's blood pressure was 90/60, with O2 sats 89-90% on room air improved with 2 L O2 to 92%.    On my evaluation, patient has dementia, but she is able to answer simple questions unsure if these answers are appropriate. Sister- Kathryn Cook is present at bedside. Patient has chronic unchanged lower extremity swelling. Patient denies difficulty breathing. She denies pain with urination. No abdominal pain, no vomiting. Patient's mental status at this time. Sister is at baseline.  ED Course: Temperature 97.7, heart rate 60s to 70s, blood pressure systolic down to 03/21 improved to 170 after 3 L sepsis fluid bolus.  O2 sats 90 to 97% on 3 L O2.  Creatinine continues to increase now 3.6, BUN of 60.  Normal lactic acid 1.8.  Covid test negative.  Stable chest x-ray showing new 1.5 cm nodule left upper lobe, chest CT recommended, mild pulmonary vascular congestion.  Obtained.  Patient was started on ceftriaxone for UTI.  Hospitalist to admit for further evaluation and management.  Review of Systems: As per HPI all other systems reviewed and negative.     Hospital Course:    Brief Summary:- 69 y.o.femalewith medical history significant fordiastolic CHF, CKD,diabetes mellitus, dementia,CVA,hypertension admitted on 02/28/20 with AKI in the setting of diarrhea   -MRCP AbdomenIMPRESSION: 1)Large hypoenhancing mass  involving the body and tail of pancreasis highly concerning for primary pancreatic adenocarcinoma. There is evidence for encasement of the superior mesenteric artery and possible involvement of the celiac trunk and portal venous confluence.  2)Multifocal rim enhancing lesions involving both lobes of liver suspicious for metastatic disease.   --Presumed metastatic stage IV pancreatic cancer with liver mets Discussed withPt with RN Kathryn Cook,  Spoke with Pt's sister face to face --- Ms Kathryn Cook [phone] and I called and spoke with ALF/Group home supervisor Ms Kathryn Cook----[phone]  A/p 1)AKI----acute kidney injury on CKD stage - IIIB   creatinine on admission= 3.6, baseline creatinine = around 2    , creatinine is now= 1.6, renally adjust medications, avoid nephrotoxic agents / dehydration  / hypotension  -Worsening renal function related to dehydration in the setting of persistent diarrhea and GI losses -Patient at risk for severe dehydration electrolyte abnormalities given persistent diarrhea -Patient and family have elected to go with comfort care/palliative/hospice at this time   2)HFpEF--Echo with EF of 65 to 70% and grade 1 diastolic dysfunction--- -comfort care  3) chronic diarrhea--54-month duration--extensive outpatient GI work-up, C. difficile and GI pathogen negative and normal celiac serologies as well as normal TSH as outpatient (May 2021) -Inpatient GI consult appreciated -Creon, Lomotil and Imodium as prescribed for diarrhea MRI abdomen --with --Presumed metastatic stage IV pancreatic cancer with liver mets Spoke with Pt's sister (Ms Kathryn Cook) and patient's nephew face to face ---  [phone] and I called and spoke with ALF/Group home supervisor Ms Kathryn Cook----[phone] --Patient and family have elected to go with comfort care/palliative/hospice at this time  4)Citrobacter koseri and Enterococcus faecalis UTI--initially  treated with IV Rocephin, Treated with ampicillin -starting 03/04/2020  5)Acute hypoxic respiratory failure----CT chest without contrast suggest possible primary lung cancer with a spiculated nodule in the left upper lobe -Family requested comfort care/hospice  6)H/o Vascular dementia and prior CVA-comfort care only  7)Recent abnormal CT abdomen/pelvisfindings- 02/21/20-A few indistinct hypodensities over the liver with the best defined over the posterior right lobe measuring 3.1 cm. Findings areindeterminate as metastatic disease is possible. This could also be further evaluated with MRI. -Beingfollowed by gastroenterology,  -Abdominal MRI -with -Presumed metastatic stage IV pancreatic cancer with liver mets Discussed withPt with RN Kathryn Cook,  Spoke with Pt's sister face to face --- Ms Kathryn Cook [phone] and I called and spoke with ALF/Group home supervisor Ms Kathryn Cook----[phone] --Patient and family have elected to go with comfort care/palliative/hospice at this time  8)HTN--- hold BP meds  9)DM2-A1c 7.4 reflecting uncontrolled DM PTA -Hold   Tradjenta -c/n  Lantus at 20 units nightly, patient was on 50 units  PTA Use Novolog/Humalog Sliding scale insulin with Accu-Cheks/Fingersticks as ordered -Please titrate insulin upward if appetite and oral intake improves with appetite stimulants   9)Social/Ethics--stage IV metastatic pancreatic cancer with liver mass and possible lung cancer as well visualized mets to the lungs in addition -Patient sister requesting hospice consult --Palliative care consult appreciated -  Disposition--discharge back to [facility] with hospice following, possible transition to residential hospice in the near future if she declines --Patient and family have elected to go with comfort care/palliative/hospice at this time  Code Status : DNR  Family Communication:   Sister and group home administrator  Discharge  Condition: Grave prognosis  Follow UP-- palliative care and hospice  Diet and Activity recommendation:  As advised  Discharge Instructions    Discharge Instructions    Call MD for:  persistant dizziness or light-headedness   Complete by: As directed    Call MD for:  persistant nausea and vomiting   Complete by: As directed    Call MD for:  severe uncontrolled pain   Complete by: As directed    Call MD for:  temperature >100.4   Complete by: As directed    Diet general   Complete by: As directed    Discharge instructions   Complete by: As directed    Follow-up with hospice to help enhance comfort and dignity for patient   Increase activity slowly   Complete by: As directed         Discharge Medications     Allergies as of 03/07/2020      Reactions   Ace Inhibitors Cough   Diovan [valsartan] Cough      Medication List    STOP taking these medications   albuterol 108 (90 Base) MCG/ACT inhaler Commonly known as: VENTOLIN HFA   amLODipine 5 MG tablet Commonly known as: NORVASC   aspirin EC 325 MG tablet   Basaglar KwikPen 100 UNIT/ML Replaced by: insulin glargine 100 UNIT/ML injection   citalopram 40 MG tablet Commonly known as: CELEXA   donepezil 10 MG tablet Commonly known as: ARICEPT   furosemide 40 MG tablet Commonly known as: LASIX   linagliptin 5 MG Tabs tablet Commonly known as: TRADJENTA   losartan 100 MG tablet Commonly known as: COZAAR   PARoxetine 20 MG tablet Commonly known as: PAXIL   simvastatin 20 MG tablet Commonly known as: ZOCOR     TAKE these medications   acetaminophen 325 MG tablet Commonly known as: TYLENOL Take 2 tablets (650 mg total) by mouth every 6 (six) hours as needed for mild pain, fever or headache (or Fever >/= 101).   carvedilol 3.125 MG tablet Commonly known as: COREG Take 1 tablet (3.125 mg total) by mouth 2 (two) times daily with a meal. What changed:   medication strength  how much to take     Combivent Respimat 20-100 MCG/ACT Aers respimat Generic drug: Ipratropium-Albuterol Inhale 1 puff into the lungs 4 (four) times daily as needed for wheezing or shortness of breath (cough).   diphenoxylate-atropine 2.5-0.025 MG tablet Commonly known as: LOMOTIL Take 1 tablet by mouth 4 (four) times daily. What changed:   when to take this  reasons to take this   HYDROcodone-acetaminophen 7.5-325 mg/15 ml solution Commonly known as: HYCET Take 15 mLs by mouth 4 (four) times daily as needed for moderate pain. Pancreatic cancer   Incruse Ellipta 62.5 MCG/INH Aepb Generic drug: umeclidinium bromide Inhale 1 puff into the lungs daily.   insulin glargine 100  UNIT/ML injection Commonly known as: LANTUS Inject 0.2 mLs (20 Units total) into the skin at bedtime. Replaces: Basaglar KwikPen 100 UNIT/ML   ketoconazole 2 % cream Commonly known as: NIZORAL Apply 1 application topically daily.   lipase/protease/amylase 36000 UNITS Cpep capsule Commonly known as: CREON Take 3 capsules (108,000 Units total) by mouth 3 (three) times daily with meals. What changed:   medication strength  how much to take  how to take this  when to take this  additional instructions   lipase/protease/amylase 36000 UNITS Cpep capsule Commonly known as: CREON Take 1 capsule (36,000 Units total) by mouth with snacks. What changed: You were already taking a medication with the same name, and this prescription was added. Make sure you understand how and when to take each.   loperamide 2 MG capsule Commonly known as: IMODIUM Take 2 capsules (4 mg total) by mouth every 8 (eight) hours as needed for diarrhea or loose stools. What changed: See the new instructions.   megestrol 400 MG/10ML suspension Commonly known as: MEGACE Take 10 mLs (400 mg total) by mouth 2 (two) times daily. For appetite stimulation   mirtazapine 15 MG tablet Commonly known as: Remeron Take 1 tablet (15 mg total) by mouth at  bedtime. For sleep, mood and appetite stimulation   multivitamin tablet Take 1 tablet by mouth daily.   NovoLOG FlexPen 100 UNIT/ML FlexPen Generic drug: insulin aspart Inject 0-10 Units into the skin See admin instructions. insulin aspart (novoLOG) injection 0-10 Units 0-10 Units Subcutaneous, 3 times daily with meals CBG < 70: Implement Hypoglycemia Standing Orders and refer to Hypoglycemia Standing Orders sidebar report  CBG 70 - 120: 0 unit CBG 121 - 150: 0 unit  CBG 151 - 200: 1 unit CBG 201 - 250: 2 units CBG 251 - 300: 4 units CBG 301 - 350: 6 units  CBG 351 - 400: 8 units  CBG > 400: 10 units What changed: See the new instructions.   ondansetron 4 MG disintegrating tablet Commonly known as: Zofran ODT Take 1 tablet (4 mg total) by mouth every 8 (eight) hours as needed for nausea or vomiting.   SYSTANE COMPLETE OP Apply 1 drop to eye 3 (three) times daily.       Major procedures and Radiology Reports - PLEASE review detailed and final reports for all details, in brief -      CT ABDOMEN PELVIS WO CONTRAST  Result Date: 02/21/2020 CLINICAL DATA:  Diarrhea with weight loss.  Diabetes. EXAM: CT ABDOMEN AND PELVIS WITHOUT CONTRAST TECHNIQUE: Multidetector CT imaging of the abdomen and pelvis was performed following the standard protocol without IV contrast. COMPARISON:  None. FINDINGS: Lower chest: Lung bases demonstrate linear scarring over the left lower lobe. There is a 5 x 11 mm nodule over the right lower lobe as well as a 4 mm subpleural nodule over the right middle lobe. Hepatobiliary: Suggestion of several indistinct hypodense liver lesions with the best defined over the posterior right lobe measuring 3.1 cm. Possible 1.6 cm hyperdense nodule over the dome of the medial segment left lobe of the liver. Gallbladder and biliary tree are unremarkable. Pancreas: Prominence with minimal associated calcification over the body of the pancreas. Spleen: Normal. Adrenals/Urinary Tract:  Adrenal glands are normal. Kidneys are normal in size without hydronephrosis or nephrolithiasis. No focal renal mass. Ureters are normal. Single focus of air over the anterior bladder. Stomach/Bowel: Stomach and small bowel are normal. Appendix is normal. Colon is unremarkable. Vascular/Lymphatic: Minimal calcified plaque  over the abdominal aorta which is normal in caliber. Few small nonspecific lymph nodes over the upper abdomen over the gastrohepatic ligament and Peri pancreatic region. Reproductive: Uterus is right of midline.  Ovaries are normal. Other: No free fluid or focal inflammatory change. Musculoskeletal: Mild degenerative changes of the spine and hips. IMPRESSION: 1.  No acute findings in the abdomen/pelvis. 2. Focal prominence with associated calcification over the body of the pancreas. Few small nonspecific adjacent lymph nodes. Recommend further evaluation with MRI. 3. A few indistinct hypodensities over the liver with the best defined over the posterior right lobe measuring 3.1 cm. Findings are indeterminate as metastatic disease is possible. This could also be further evaluated with MRI. 4. Two nodules over the right mid to lower lung with the larger over the lateral right lower lobe measuring 8 mm. Recommend chest CT with contrast for complete evaluation of the lungs and thorax. 5.  Aortic Atherosclerosis (ICD10-I70.0). 6. Tiny focus of air over the anterior bladder which may be due to recent instrumentation although can be seen due to infection or enteric fistula. Electronically Signed   By: Marin Olp M.D.   On: 02/21/2020 16:38   CT CHEST WO CONTRAST  Result Date: 02/28/2020 CLINICAL DATA:  Respiratory failure.  Pulmonary nodules EXAM: CT CHEST WITHOUT CONTRAST TECHNIQUE: Multidetector CT imaging of the chest was performed following the standard protocol without IV contrast. COMPARISON:  CT abdomen 02/21/2020 FINDINGS: Cardiovascular: Heart is normal size. Aorta is normal caliber. Aortic  atherosclerosis. Mediastinum/Nodes: No mediastinal, hilar, or axillary adenopathy. Lungs/Pleura: 13 mm spiculated nodule in the left upper lobe on image 28. Right lower lobe nodule as seen on prior abdominal CT measures 8 mm on image 64. Small subpleural nodule in the right middle lobe measures 4 mm. Mild elevation of the right hemidiaphragm. Bibasilar atelectasis or scarring. No effusions. Upper Abdomen: Ill-defined hypodensities again noted in the liver, best seen in the posterior right hepatic lobe measuring approximately 3.3 cm, similar to prior abdominal CT. Musculoskeletal: Chest wall soft tissues are unremarkable. No acute bony abnormality. IMPRESSION: Previously seen nodules in the right lower lobe and right middle lobe as seen on prior abdominal CT. There is also a spiculated 13 mm nodule in the left upper lobe. Appearance is concerning for possible primary lung cancer. Consider further evaluation with PET CT. Ill-defined low-density lesions in the liver as seen on prior abdominal CT. Cannot exclude metastatic disease. Aortic Atherosclerosis (ICD10-I70.0). Electronically Signed   By: Rolm Baptise M.D.   On: 02/28/2020 20:55   MR ABDOMEN W WO CONTRAST  Result Date: 03/04/2020 CLINICAL DATA:  EVALUATE LIVER LESIONS. EXAM: MRI ABDOMEN WITHOUT AND WITH CONTRAST TECHNIQUE: Multiplanar multisequence MR imaging of the abdomen was performed both before and after the administration of intravenous contrast. CONTRAST:  72mL GADAVIST GADOBUTROL 1 MMOL/ML IV SOLN COMPARISON:  02/28/2020 FINDINGS: Lower chest: Subpleural consolidation noted in the right lung base. Hepatobiliary: There are multifocal rim enhancing lesions involving both lobes of liver suspicious for metastatic disease. -Index lesion within segment 7/6 measures 5.2 by 3.2 cm, image 22/19. -Index lesion in segment 2 of left lobe of liver measures 5.9 x 4.3 cm, image 15/9. -Segment 3 index lesion measures 2.7 by 2.5 cm lesion, image 24/19. -Segment 8/4  index lesion measures 4.0 x 3.8 cm, image 11/19. Tiny stones versus sludge noted within the dependent portion of the gallbladder. No significant gallbladder wall thickening or inflammation. No bile duct dilatation. Pancreas: There is a hypoenhancing mass involving the body  and tail of pancreas which measures approximately 9.0 x 3.7 cm, image 34/17. This is highly concerning for primary pancreatic adenocarcinoma. Tumor encasement of the superior mesenteric artery is identified, image 38/21. The celiac artery is not well seen and may also be encased by tumor. Difficult to exclude involvement of the portal venous confluence due to motion artifact. Spleen:  Within normal limits in size and appearance. Adrenals/Urinary Tract: Normal adrenal glands. No kidney mass or hydronephrosis. Stomach/Bowel: Visualized portions within the abdomen are unremarkable. Vascular/Lymphatic: Normal appearance of the abdominal aorta. Within the limitations of motion artifact no adenopathy identified within the upper abdomen. Other:  No free fluid or fluid collections. Musculoskeletal: No suspicious bone lesions identified. IMPRESSION: 1. Large hypoenhancing mass involving the body and tail of pancreas is highly concerning for primary pancreatic adenocarcinoma. There is evidence for encasement of the superior mesenteric artery and possible involvement of the celiac trunk and portal venous confluence. 2. Multifocal rim enhancing lesions involving both lobes of liver suspicious for metastatic disease. Electronically Signed   By: Kerby Moors M.D.   On: 03/04/2020 14:15   MR 3D Recon At Scanner  Result Date: 03/04/2020 CLINICAL DATA:  EVALUATE LIVER LESIONS. EXAM: MRI ABDOMEN WITHOUT AND WITH CONTRAST TECHNIQUE: Multiplanar multisequence MR imaging of the abdomen was performed both before and after the administration of intravenous contrast. CONTRAST:  46mL GADAVIST GADOBUTROL 1 MMOL/ML IV SOLN COMPARISON:  02/28/2020 FINDINGS: Lower chest:  Subpleural consolidation noted in the right lung base. Hepatobiliary: There are multifocal rim enhancing lesions involving both lobes of liver suspicious for metastatic disease. -Index lesion within segment 7/6 measures 5.2 by 3.2 cm, image 22/19. -Index lesion in segment 2 of left lobe of liver measures 5.9 x 4.3 cm, image 15/9. -Segment 3 index lesion measures 2.7 by 2.5 cm lesion, image 24/19. -Segment 8/4 index lesion measures 4.0 x 3.8 cm, image 11/19. Tiny stones versus sludge noted within the dependent portion of the gallbladder. No significant gallbladder wall thickening or inflammation. No bile duct dilatation. Pancreas: There is a hypoenhancing mass involving the body and tail of pancreas which measures approximately 9.0 x 3.7 cm, image 34/17. This is highly concerning for primary pancreatic adenocarcinoma. Tumor encasement of the superior mesenteric artery is identified, image 38/21. The celiac artery is not well seen and may also be encased by tumor. Difficult to exclude involvement of the portal venous confluence due to motion artifact. Spleen:  Within normal limits in size and appearance. Adrenals/Urinary Tract: Normal adrenal glands. No kidney mass or hydronephrosis. Stomach/Bowel: Visualized portions within the abdomen are unremarkable. Vascular/Lymphatic: Normal appearance of the abdominal aorta. Within the limitations of motion artifact no adenopathy identified within the upper abdomen. Other:  No free fluid or fluid collections. Musculoskeletal: No suspicious bone lesions identified. IMPRESSION: 1. Large hypoenhancing mass involving the body and tail of pancreas is highly concerning for primary pancreatic adenocarcinoma. There is evidence for encasement of the superior mesenteric artery and possible involvement of the celiac trunk and portal venous confluence. 2. Multifocal rim enhancing lesions involving both lobes of liver suspicious for metastatic disease. Electronically Signed   By: Kerby Moors M.D.   On: 03/04/2020 14:15   DG Chest Port 1 View  Result Date: 02/28/2020 CLINICAL DATA:  Shortness of breath. EXAM: PORTABLE CHEST 1 VIEW COMPARISON:  Chest x-ray dated July 19, 2018. FINDINGS: The heart size and mediastinal contours are within normal limits. Pulmonary vascular congestion with chronically coarse interstitial markings. New 1.5 cm nodule in the left  upper lobe. No focal consolidation, pleural effusion, or pneumothorax. No acute osseous abnormality. IMPRESSION: 1. New 1.5 cm nodule in the left upper lobe. Chest CT is recommended for further evaluation. 2. Mild pulmonary vascular congestion. Electronically Signed   By: Titus Dubin M.D.   On: 02/28/2020 15:49   ECHOCARDIOGRAM COMPLETE  Result Date: 02/29/2020    ECHOCARDIOGRAM REPORT   Patient Name:   SAMYRAH BRUSTER Date of Exam: 02/29/2020 Medical Rec #:  [id number]           Height:       64.0 in Accession #:    [id number]          Weight:       223.2 lb Date of Birth:  Feb 15, 1951           BSA:          2.050 m Patient Age:    89 years            BP:           105/74 mmHg Patient Gender: F                   HR:           73 bpm. Exam Location:  [facility] Procedure: 2D Echo Indications:    Congestive Heart Failure 428.0 / I50.9  History:        Patient has prior history of Echocardiogram examinations, most                 recent 06/11/2013. CHF, Stroke; Risk Factors:Hypertension,                 Dyslipidemia, Diabetes and Former Smoker. LVH.  Sonographer:    Leavy Cella RDCS (AE) Referring Phys: [place]  1. Left ventricular ejection fraction, by estimation, is 65 to 70%. The left ventricle has normal function. The left ventricle has no regional wall motion abnormalities. There is mild left ventricular hypertrophy. Left ventricular diastolic parameters are consistent with Grade I diastolic dysfunction (impaired relaxation).  2. Right ventricular systolic function is low normal. The right  ventricular size is mildly enlarged. There is mildly elevated pulmonary artery systolic pressure.  3. The mitral valve is normal in structure. No evidence of mitral valve regurgitation. No evidence of mitral stenosis.  4. The aortic valve is tricuspid. Aortic valve regurgitation is not visualized. No aortic stenosis is present. FINDINGS  Left Ventricle: Left ventricular ejection fraction, by estimation, is 65 to 70%. The left ventricle has normal function. The left ventricle has no regional wall motion abnormalities. The left ventricular internal cavity size was normal in size. There is  mild left ventricular hypertrophy. Left ventricular diastolic parameters are consistent with Grade I diastolic dysfunction (impaired relaxation). Normal left ventricular filling pressure. Right Ventricle: The right ventricular size is mildly enlarged. Right vetricular wall thickness was not assessed. Right ventricular systolic function is low normal. There is mildly elevated pulmonary artery systolic pressure. The tricuspid regurgitant velocity is 2.86 m/s, and with an assumed right atrial pressure of 10 mmHg, the estimated right ventricular systolic pressure is 99.2 mmHg. Left Atrium: Left atrial size was normal in size. Right Atrium: Right atrial size was not well visualized. Pericardium: There is no evidence of pericardial effusion. Mitral Valve: The mitral valve is normal in structure. No evidence of mitral valve regurgitation. No evidence of mitral valve stenosis. Tricuspid Valve: The tricuspid valve is normal in structure. Tricuspid valve regurgitation  is mild . No evidence of tricuspid stenosis. Aortic Valve: The aortic valve is tricuspid. Aortic valve regurgitation is not visualized. No aortic stenosis is present. Aortic valve mean gradient measures 4.0 mmHg. Aortic valve peak gradient measures 7.4 mmHg. Aortic valve area, by VTI measures 2.61 cm. Pulmonic Valve: The pulmonic valve was not well visualized. Pulmonic valve  regurgitation is not visualized. No evidence of pulmonic stenosis. Aorta: The aortic root is normal in size and structure. Pulmonary Artery: Indeterminant PASP, IVC poorly visualized. Venous: The inferior vena cava was not well visualized. IAS/Shunts: The interatrial septum was not well visualized.  LEFT VENTRICLE PLAX 2D LVIDd:         4.15 cm  Diastology LVIDs:         1.76 cm  LV e' lateral:   8.38 cm/s LV PW:         1.12 cm  LV E/e' lateral: 4.3 LV IVS:        1.17 cm  LV e' medial:    5.66 cm/s LVOT diam:     2.00 cm  LV E/e' medial:  6.4 LV SV:         55 LV SV Index:   27 LVOT Area:     3.14 cm  RIGHT VENTRICLE RV S prime:     8.05 cm/s TAPSE (M-mode): 1.6 cm LEFT ATRIUM           Index       RIGHT ATRIUM           Index LA diam:      2.10 cm 1.02 cm/m  RA Area:     13.30 cm LA Vol (A2C): 21.8 ml 10.64 ml/m RA Volume:   36.20 ml  17.66 ml/m LA Vol (A4C): 32.1 ml 15.66 ml/m  AORTIC VALVE AV Area (Vmax):    2.37 cm AV Area (Vmean):   2.25 cm AV Area (VTI):     2.61 cm AV Vmax:           135.74 cm/s AV Vmean:          96.225 cm/s AV VTI:            0.210 m AV Peak Grad:      7.4 mmHg AV Mean Grad:      4.0 mmHg LVOT Vmax:         102.35 cm/s LVOT Vmean:        68.803 cm/s LVOT VTI:          0.174 m LVOT/AV VTI ratio: 0.83  AORTA Ao Root diam: 3.00 cm MITRAL VALVE               TRICUSPID VALVE MV Area (PHT): 1.98 cm    TR Peak grad:   32.7 mmHg MV Decel Time: 384 msec    TR Vmax:        286.00 cm/s MV E velocity: 36.40 cm/s MV A velocity: 75.20 cm/s  SHUNTS MV E/A ratio:  0.48        Systemic VTI:  0.17 m                            Systemic Diam: 2.00 cm Carlyle Dolly MD Electronically signed by Carlyle Dolly MD Signature Date/Time: 02/29/2020/10:45:25 AM    Final     Micro Results     Recent Results (from the past 240 hour(s))  Blood Culture (routine x 2)     Status:  None   Collection Time: 02/28/20  3:23 PM   Specimen: Right Antecubital; Blood  Result Value Ref Range Status   Specimen  Description RIGHT ANTECUBITAL  Final   Special Requests   Final    BOTTLES DRAWN AEROBIC AND ANAEROBIC Blood Culture adequate volume   Culture   Final    NO GROWTH 5 DAYS Performed at [facility], [address], [place], [place] [address]    Report Status 03/04/2020 FINAL  Final  Blood Culture (routine x 2)     Status: None   Collection Time: 02/28/20  3:29 PM   Specimen: Left Antecubital; Blood  Result Value Ref Range Status   Specimen Description LEFT ANTECUBITAL  Final   Special Requests   Final    BOTTLES DRAWN AEROBIC AND ANAEROBIC Blood Culture adequate volume   Culture   Final    NO GROWTH 5 DAYS Performed at [facility], [address], [place], [place] [address]    Report Status 03/04/2020 FINAL  Final  SARS Coronavirus 2 by RT PCR (hospital order, performed in [facility] hospital lab) Nasopharyngeal Nasopharyngeal Swab     Status: None   Collection Time: 02/28/20  4:08 PM   Specimen: Nasopharyngeal Swab  Result Value Ref Range Status   SARS Coronavirus 2 NEGATIVE NEGATIVE Final    Comment: (NOTE) SARS-CoV-2 target nucleic acids are NOT DETECTED. The SARS-CoV-2 RNA is generally detectable in upper and lower respiratory specimens during the acute phase of infection. The lowest concentration of SARS-CoV-2 viral copies this assay can detect is 250 copies / mL. A negative result does not preclude SARS-CoV-2 infection and should not be used as the sole basis for treatment or other patient management decisions.  A negative result may occur with improper specimen collection / handling, submission of specimen other than nasopharyngeal swab, presence of viral mutation(s) within the areas targeted by this assay, and inadequate number of viral copies (<250 copies / mL). A negative result must be combined with clinical observations, patient history, and epidemiological information. Fact Sheet for Patients:   StrictlyIdeas.no Fact Sheet for Healthcare  Providers: BankingDealers.co.za This test is not yet approved or cleared  by the [place] FDA and has been authorized for detection and/or diagnosis of SARS-CoV-2 by FDA under an Emergency Use Authorization (EUA).  This EUA will remain in effect (meaning this test can be used) for the duration of the COVID-19 declaration under Section 564(b)(1) of the Act, 21 U.S.C. section 360bbb-3(b)(1), unless the authorization is terminated or revoked sooner. Performed at [facility], [address], [place], [place] [address]   Urine culture     Status: Abnormal   Collection Time: 02/28/20  5:40 PM   Specimen: In/Out Cath Urine  Result Value Ref Range Status   Specimen Description   Final    IN/OUT CATH URINE Performed at [facility], [address], [place], [place] [address]    Special Requests   Final    NONE Performed at [facility], [address], [place], [place] [address]    Culture 20,000 COLONIES/mL ENTEROCOCCUS FAECALIS (A)  Final   Report Status 03/01/2020 FINAL  Final   Organism ID, Bacteria ENTEROCOCCUS FAECALIS (A)  Final      Susceptibility   Enterococcus faecalis - MIC*    AMPICILLIN <=2 SENSITIVE Sensitive     NITROFURANTOIN 64 INTERMEDIATE Intermediate     VANCOMYCIN 2 SENSITIVE Sensitive     * 20,000 COLONIES/mL ENTEROCOCCUS FAECALIS  Today   Subjective    [facility] today has no new complaints -Appetite is not great -Diarrhea is not worse      Patient has been seen and examined prior to discharge   Objective   Blood pressure 120/71, pulse 71, temperature 98.4 F (36.9 C), temperature source Oral, resp. rate 16, height 5\' 4"  (1.626 m), weight 105.1 kg, SpO2 94 %.   Intake/Output Summary (Last 24 hours) at 03/07/2020 1629 Last data filed at 03/06/2020 1800 Gross per 24 hour  Intake 240 ml  Output --  Net 240 ml    Exam Gen:- Awake Alert, no acute distress  HEENT:- [place].AT, No sclera icterus Neck-Supple Neck,No  JVD,.  Lungs-  CTAB , good air movement bilaterally  CV- S1, S2 normal, regular Abd-  +ve B.Sounds, Abd Soft, No tenderness,    Extremity/Skin:- No  edema,   good pulses Psych-affect is appropriate, baseline forgetfulness and mild cognitive deficits  neuro-generalized weakness, no new focal deficits, no tremors    Data Review   CBC w Diff:  Lab Results  Component Value Date   WBC 7.9 03/05/2020   HGB 11.5 (L) 03/05/2020   HCT 37.1 03/05/2020   PLT 189 03/05/2020   LYMPHOPCT 20 02/28/2020   MONOPCT 10 02/28/2020   EOSPCT 1 02/28/2020   BASOPCT 1 02/28/2020    CMP:  Lab Results  Component Value Date   NA 142 03/07/2020   NA 143 12/10/2016   K 3.6 03/07/2020   CL 115 (H) 03/07/2020   CO2 22 03/07/2020   BUN 19 03/07/2020   BUN 26 12/10/2016   CREATININE 1.68 (H) 03/07/2020   CREATININE 2.88 (H) 02/26/2020   PROT 6.5 03/03/2020   PROT 7.4 12/10/2016   ALBUMIN 2.8 (L) 03/03/2020   ALBUMIN 4.3 12/10/2016   BILITOT 0.2 (L) 03/03/2020   BILITOT 0.2 12/10/2016   ALKPHOS 66 03/03/2020   AST 15 03/03/2020   ALT 12 03/03/2020  . -- -discharge back to [facility]F with hospice following, possible transition to residential hospice in the near future if she declines --Patient and family have elected to go with comfort care/palliative/hospice at this time   Total Discharge time is about 33 minutes  Roxan Hockey M.D on 03/07/2020 at 4:29 PM  Go to [email or web address] -  for contact info  [facility] - Office  [phone]

## 2020-03-07 NOTE — TOC Transition Note (Signed)
Transition of Care [facility]) - CM/SW Discharge Note   Patient Details  Name: Kathryn Cook MRN: [id number] Date of Birth: 1951-07-04  Transition of Care [facility]) CM/SW Contact:  Boneta Lucks, RN Phone Number: 03/07/2020, 10:49 AM   Clinical Narrative:   [facility] will follow patient at [facility]. She can transfer to hospice house with family and ALF are ready.  Vito Backers is ordering equipment needed from [place], she will advise when delivered.     Final next level of care: Assisted Living Barriers to Discharge: Barriers Resolved   Patient Goals and CMS Choice Patient states their goals for this hospitalization and ongoing recovery are:: to return to ALF with hospice referral CMS Medicare.gov Compare Post Acute Care list provided to:: Patient Represenative (must comment)    Discharge Placement              Patient chooses bed at: Other - please specify in the comment section below: [facility])     Patient and family notified of of transfer: 03/07/20  Discharge Plan and Services In-house Referral: Clinical Social Work   Post Acute Care Choice: Resumption of Svcs/PTA Provider

## 2020-03-07 NOTE — Care Management Important Message (Signed)
Important Message  Patient Details  Name: Kathryn Cook MRN: [id number] Date of Birth: 06/28/1951   Medicare Important Message Given:  Yes     Tommy Medal 03/07/2020, 1:31 PM

## 2020-03-07 NOTE — Clinical Social Work Note (Signed)
FL2 completed and faxed over for [facility] [phone])  Tobi Bastos, LCSW Transitions of [place] Worker [facility] Ph: [phone]

## 2020-03-07 NOTE — Care Management Important Message (Signed)
Important Message  Patient Details  Name: Kathryn Cook MRN: [id number] Date of Birth: 02/13/1951   Medicare Important Message Given:  Yes     Tommy Medal 03/07/2020, 1:34 PM

## 2020-03-07 NOTE — NC FL2 (Signed)
[place] LEVEL OF CARE SCREENING TOOL     IDENTIFICATION  Patient Name: Kathryn Cook Birthdate: 10-16-1950 Sex: female Admission Date (Current Location): 02/28/2020  [place] and [place] Number:  [place] [facility] [id number] [place] and Address:  [facility] [address], [place]      Provider Number: [id number]  Attending Physician Name and Address:  Roxan Hockey, MD  Relative Name and Phone Number:  [facility]: [phone]    Current Level of Care: Hospital Recommended Level of Care: [facility]) Prior Approval Number:    Date Approved/Denied:   PASRR Number:    Discharge Plan: Other (Comment) [facility])    Current Diagnoses: Patient Active Problem List   Diagnosis Date Noted  . Hospital discharge follow-up   . Abnormal CT scan   . DNR (do not resuscitate)   . Palliative care by specialist   . Pancreatic mass/-Presumed metastatic stage IV pancreatic cancer with liver mets 03/04/2020  . AKI (acute kidney injury) ([place]) 02/28/2020  . Diarrhea 02/08/2020  . Mixed hyperlipidemia 05/17/2019  . Essential hypertension, benign 05/17/2019  . Healthcare maintenance 03/02/2016  . Type II diabetes mellitus with neurological manifestations ([place]) 06/12/2015  . Moderate dementia without behavioral disturbance ([place]) 12/06/2013  . Dementia ([place]) 10/18/2013  . Tobacco abuse 10/18/2013  . HTN (hypertension), malignant 10/18/2013  . Uncontrolled type 2 diabetes mellitus with stage 4 chronic kidney disease ([place]) 10/01/2013  . Cough 08/27/2013  . Tobacco use disorder 08/27/2013  . H/O: CVA (cerebrovascular accident) 08/27/2013  . Vascular dementia ([place]) 08/27/2013  . Diastolic congestive heart failure, NYHA class 1 ([place]) 06/13/2013  . LVH (left ventricular hypertrophy) due to hypertensive disease 06/13/2013  . CVA (cerebral infarction) 06/10/2013  . History of alcohol use 05/31/2013  . HTN (hypertension)  05/31/2013  . Memory loss 05/31/2013  . Kidney disease, chronic, stage III (GFR 30-59 ml/min) 05/31/2013    Orientation RESPIRATION BLADDER Height & Weight     Self, Place  Normal Continent Weight: 231 lb 11.3 oz (105.1 kg) Height:  5\' 4"  (162.6 cm)  BEHAVIORAL SYMPTOMS/MOOD NEUROLOGICAL BOWEL NUTRITION STATUS      Continent Diet (No Concentrated Sweets)  AMBULATORY STATUS COMMUNICATION OF NEEDS Skin   Independent Verbally Normal                       Personal Care Assistance Level of Assistance  Bathing, Feeding, Dressing Bathing Assistance: Limited assistance Feeding assistance: Independent Dressing Assistance: Limited assistance     Functional Limitations Info  Sight, Hearing, Speech Sight Info: Adequate Hearing Info: Adequate Speech Info: Adequate    SPECIAL CARE FACTORS FREQUENCY                       Contractures Contractures Info: Not present    Additional Factors Info  Code Status, Allergies Code Status Info: Full Allergies Info: Ace Inhibitors, Diovan           Current Medications (03/07/2020):  This is the current hospital active medication list Current Facility-Administered Medications  Medication Dose Route Frequency Provider Last Rate Last Admin  . 0.9 %  sodium chloride infusion   Intravenous Continuous Roxan Hockey, MD 50 mL/hr at 03/07/20 0803 New Bag at 03/07/20 0803  . acetaminophen (TYLENOL) tablet 650 mg  650 mg Oral Q6H PRN Emokpae, Ejiroghene E, MD       Or  . acetaminophen (TYLENOL) suppository 650 mg  650 mg  Rectal Q6H PRN Emokpae, Ejiroghene E, MD      . ampicillin (OMNIPEN) 1 g in sodium chloride 0.9 % 100 mL IVPB  1 g Intravenous Q6H Emokpae, Courage, MD 300 mL/hr at 03/07/20 0805 1 g at 03/07/20 0805  . aspirin EC tablet 325 mg  325 mg Oral Daily Emokpae, Ejiroghene E, MD   325 mg at 03/07/20 0810  . citalopram (CELEXA) tablet 40 mg  40 mg Oral Daily Emokpae, Ejiroghene E, MD   40 mg at 03/07/20 0811  .  diphenoxylate-atropine (LOMOTIL) 2.5-0.025 MG per tablet 1 tablet  1 tablet Oral QID Pershing Proud, NP   1 tablet at 03/07/20 1554  . donepezil (ARICEPT) tablet 10 mg  10 mg Oral QHS Emokpae, Ejiroghene E, MD   10 mg at 03/06/20 2131  . glycopyrrolate (ROBINUL) tablet 2 mg  2 mg Oral TID Pershing Proud, NP   2 mg at 03/07/20 1554  . heparin injection 5,000 Units  5,000 Units Subcutaneous Q8H Emokpae, Ejiroghene E, MD   5,000 Units at 03/07/20 1554  . insulin aspart (novoLOG) injection 0-5 Units  0-5 Units Subcutaneous QHS Emokpae, Ejiroghene E, MD      . insulin aspart (novoLOG) injection 0-9 Units  0-9 Units Subcutaneous TID WC Emokpae, Ejiroghene E, MD   2 Units at 03/07/20 1159  . insulin glargine (LANTUS) injection 20 Units  20 Units Subcutaneous QHS Roxan Hockey, MD   20 Units at 03/06/20 2141  . ipratropium-albuterol (DUONEB) 0.5-2.5 (3) MG/3ML nebulizer solution 3 mL  3 mL Nebulization Q6H PRN Donnamae Jude, RPH      . lipase/protease/amylase (CREON) capsule 108,000 Units  108,000 Units Oral TID WC Pershing Proud, NP   841,324 Units at 03/07/20 1200  . lipase/protease/amylase (CREON) capsule 36,000 Units  36,000 Units Oral With snacks Annitta Needs, NP   36,000 Units at 03/06/20 2140  . ondansetron (ZOFRAN) tablet 4 mg  4 mg Oral Q6H PRN Emokpae, Ejiroghene E, MD       Or  . ondansetron (ZOFRAN) injection 4 mg  4 mg Intravenous Q6H PRN Emokpae, Ejiroghene E, MD      . polyethylene glycol (MIRALAX / GLYCOLAX) packet 17 g  17 g Oral Daily PRN Emokpae, Ejiroghene E, MD      . saccharomyces boulardii (FLORASTOR) capsule 250 mg  250 mg Oral BID Annitta Needs, NP   250 mg at 03/07/20 0811  . umeclidinium bromide (INCRUSE ELLIPTA) 62.5 MCG/INH 1 puff  1 puff Inhalation Daily Emokpae, Ejiroghene E, MD   1 puff at 03/07/20 0759     Discharge Medications: Please see discharge summary for a list of discharge medications.  Relevant Imaging Results:  Relevant Lab Results:   Additional  Information SSN#: [id number]  Sherie Don, LCSW

## 2020-03-07 NOTE — Discharge Instructions (Signed)
Follow-up with hospice to help enhance comfort and dignity for patient

## 2020-03-08 DIAGNOSIS — E118 Type 2 diabetes mellitus with unspecified complications: Secondary | ICD-10-CM | POA: Diagnosis not present

## 2020-03-08 DIAGNOSIS — G4700 Insomnia, unspecified: Secondary | ICD-10-CM | POA: Diagnosis not present

## 2020-03-08 DIAGNOSIS — R110 Nausea: Secondary | ICD-10-CM | POA: Diagnosis not present

## 2020-03-08 DIAGNOSIS — R52 Pain, unspecified: Secondary | ICD-10-CM | POA: Diagnosis not present

## 2020-03-08 DIAGNOSIS — C259 Malignant neoplasm of pancreas, unspecified: Secondary | ICD-10-CM | POA: Diagnosis not present

## 2020-03-08 DIAGNOSIS — I10 Essential (primary) hypertension: Secondary | ICD-10-CM | POA: Diagnosis not present

## 2020-03-08 DIAGNOSIS — R197 Diarrhea, unspecified: Secondary | ICD-10-CM | POA: Diagnosis not present

## 2020-03-08 DIAGNOSIS — F0390 Unspecified dementia without behavioral disturbance: Secondary | ICD-10-CM | POA: Diagnosis not present

## 2020-03-10 DIAGNOSIS — F0390 Unspecified dementia without behavioral disturbance: Secondary | ICD-10-CM | POA: Diagnosis not present

## 2020-03-10 DIAGNOSIS — R197 Diarrhea, unspecified: Secondary | ICD-10-CM | POA: Diagnosis not present

## 2020-03-10 DIAGNOSIS — I10 Essential (primary) hypertension: Secondary | ICD-10-CM | POA: Diagnosis not present

## 2020-03-10 DIAGNOSIS — C259 Malignant neoplasm of pancreas, unspecified: Secondary | ICD-10-CM | POA: Diagnosis not present

## 2020-03-10 DIAGNOSIS — E118 Type 2 diabetes mellitus with unspecified complications: Secondary | ICD-10-CM | POA: Diagnosis not present

## 2020-03-10 DIAGNOSIS — G4700 Insomnia, unspecified: Secondary | ICD-10-CM | POA: Diagnosis not present

## 2020-03-11 DIAGNOSIS — E118 Type 2 diabetes mellitus with unspecified complications: Secondary | ICD-10-CM | POA: Diagnosis not present

## 2020-03-11 DIAGNOSIS — C259 Malignant neoplasm of pancreas, unspecified: Secondary | ICD-10-CM | POA: Diagnosis not present

## 2020-03-11 DIAGNOSIS — F0390 Unspecified dementia without behavioral disturbance: Secondary | ICD-10-CM | POA: Diagnosis not present

## 2020-03-11 DIAGNOSIS — R197 Diarrhea, unspecified: Secondary | ICD-10-CM | POA: Diagnosis not present

## 2020-03-11 DIAGNOSIS — I10 Essential (primary) hypertension: Secondary | ICD-10-CM | POA: Diagnosis not present

## 2020-03-11 DIAGNOSIS — G4700 Insomnia, unspecified: Secondary | ICD-10-CM | POA: Diagnosis not present

## 2020-03-12 ENCOUNTER — Ambulatory Visit: Payer: Medicare Other | Admitting: Urology

## 2020-03-12 DIAGNOSIS — G4700 Insomnia, unspecified: Secondary | ICD-10-CM | POA: Diagnosis not present

## 2020-03-12 DIAGNOSIS — E118 Type 2 diabetes mellitus with unspecified complications: Secondary | ICD-10-CM | POA: Diagnosis not present

## 2020-03-12 DIAGNOSIS — R5381 Other malaise: Secondary | ICD-10-CM | POA: Diagnosis not present

## 2020-03-12 DIAGNOSIS — R69 Illness, unspecified: Secondary | ICD-10-CM | POA: Diagnosis not present

## 2020-03-12 DIAGNOSIS — C259 Malignant neoplasm of pancreas, unspecified: Secondary | ICD-10-CM | POA: Diagnosis not present

## 2020-03-12 DIAGNOSIS — I10 Essential (primary) hypertension: Secondary | ICD-10-CM | POA: Diagnosis not present

## 2020-03-12 DIAGNOSIS — F0390 Unspecified dementia without behavioral disturbance: Secondary | ICD-10-CM | POA: Diagnosis not present

## 2020-03-12 DIAGNOSIS — R197 Diarrhea, unspecified: Secondary | ICD-10-CM | POA: Diagnosis not present

## 2020-03-12 DIAGNOSIS — Z7401 Bed confinement status: Secondary | ICD-10-CM | POA: Diagnosis not present

## 2020-03-13 DIAGNOSIS — E118 Type 2 diabetes mellitus with unspecified complications: Secondary | ICD-10-CM | POA: Diagnosis not present

## 2020-03-13 DIAGNOSIS — F0390 Unspecified dementia without behavioral disturbance: Secondary | ICD-10-CM | POA: Diagnosis not present

## 2020-03-13 DIAGNOSIS — C259 Malignant neoplasm of pancreas, unspecified: Secondary | ICD-10-CM | POA: Diagnosis not present

## 2020-03-13 DIAGNOSIS — G4700 Insomnia, unspecified: Secondary | ICD-10-CM | POA: Diagnosis not present

## 2020-03-13 DIAGNOSIS — I10 Essential (primary) hypertension: Secondary | ICD-10-CM | POA: Diagnosis not present

## 2020-03-13 DIAGNOSIS — R197 Diarrhea, unspecified: Secondary | ICD-10-CM | POA: Diagnosis not present

## 2020-03-14 DIAGNOSIS — I10 Essential (primary) hypertension: Secondary | ICD-10-CM | POA: Diagnosis not present

## 2020-03-14 DIAGNOSIS — G4700 Insomnia, unspecified: Secondary | ICD-10-CM | POA: Diagnosis not present

## 2020-03-14 DIAGNOSIS — R197 Diarrhea, unspecified: Secondary | ICD-10-CM | POA: Diagnosis not present

## 2020-03-14 DIAGNOSIS — F0390 Unspecified dementia without behavioral disturbance: Secondary | ICD-10-CM | POA: Diagnosis not present

## 2020-03-14 DIAGNOSIS — E118 Type 2 diabetes mellitus with unspecified complications: Secondary | ICD-10-CM | POA: Diagnosis not present

## 2020-03-14 DIAGNOSIS — C259 Malignant neoplasm of pancreas, unspecified: Secondary | ICD-10-CM | POA: Diagnosis not present

## 2020-03-15 DIAGNOSIS — F0390 Unspecified dementia without behavioral disturbance: Secondary | ICD-10-CM | POA: Diagnosis not present

## 2020-03-15 DIAGNOSIS — R197 Diarrhea, unspecified: Secondary | ICD-10-CM | POA: Diagnosis not present

## 2020-03-15 DIAGNOSIS — G4700 Insomnia, unspecified: Secondary | ICD-10-CM | POA: Diagnosis not present

## 2020-03-15 DIAGNOSIS — E118 Type 2 diabetes mellitus with unspecified complications: Secondary | ICD-10-CM | POA: Diagnosis not present

## 2020-03-15 DIAGNOSIS — C259 Malignant neoplasm of pancreas, unspecified: Secondary | ICD-10-CM | POA: Diagnosis not present

## 2020-03-15 DIAGNOSIS — I10 Essential (primary) hypertension: Secondary | ICD-10-CM | POA: Diagnosis not present

## 2020-03-16 DIAGNOSIS — C259 Malignant neoplasm of pancreas, unspecified: Secondary | ICD-10-CM | POA: Diagnosis not present

## 2020-03-16 DIAGNOSIS — F0390 Unspecified dementia without behavioral disturbance: Secondary | ICD-10-CM | POA: Diagnosis not present

## 2020-03-16 DIAGNOSIS — R197 Diarrhea, unspecified: Secondary | ICD-10-CM | POA: Diagnosis not present

## 2020-03-16 DIAGNOSIS — G4700 Insomnia, unspecified: Secondary | ICD-10-CM | POA: Diagnosis not present

## 2020-03-16 DIAGNOSIS — E118 Type 2 diabetes mellitus with unspecified complications: Secondary | ICD-10-CM | POA: Diagnosis not present

## 2020-03-16 DIAGNOSIS — I10 Essential (primary) hypertension: Secondary | ICD-10-CM | POA: Diagnosis not present

## 2020-03-17 DIAGNOSIS — R197 Diarrhea, unspecified: Secondary | ICD-10-CM | POA: Diagnosis not present

## 2020-03-17 DIAGNOSIS — E118 Type 2 diabetes mellitus with unspecified complications: Secondary | ICD-10-CM | POA: Diagnosis not present

## 2020-03-17 DIAGNOSIS — C259 Malignant neoplasm of pancreas, unspecified: Secondary | ICD-10-CM | POA: Diagnosis not present

## 2020-03-17 DIAGNOSIS — R0902 Hypoxemia: Secondary | ICD-10-CM | POA: Diagnosis not present

## 2020-03-17 DIAGNOSIS — Z7401 Bed confinement status: Secondary | ICD-10-CM | POA: Diagnosis not present

## 2020-03-17 DIAGNOSIS — G4700 Insomnia, unspecified: Secondary | ICD-10-CM | POA: Diagnosis not present

## 2020-03-17 DIAGNOSIS — I10 Essential (primary) hypertension: Secondary | ICD-10-CM | POA: Diagnosis not present

## 2020-03-17 DIAGNOSIS — F0390 Unspecified dementia without behavioral disturbance: Secondary | ICD-10-CM | POA: Diagnosis not present

## 2020-03-17 DIAGNOSIS — E1165 Type 2 diabetes mellitus with hyperglycemia: Secondary | ICD-10-CM | POA: Diagnosis not present

## 2020-03-27 DIAGNOSIS — R41841 Cognitive communication deficit: Secondary | ICD-10-CM | POA: Diagnosis present

## 2020-03-27 DIAGNOSIS — N1832 Chronic kidney disease, stage 3b: Secondary | ICD-10-CM | POA: Diagnosis present

## 2020-03-27 DIAGNOSIS — I5030 Unspecified diastolic (congestive) heart failure: Secondary | ICD-10-CM | POA: Diagnosis present

## 2020-03-27 DIAGNOSIS — I10 Essential (primary) hypertension: Secondary | ICD-10-CM | POA: Diagnosis not present

## 2020-03-27 DIAGNOSIS — R197 Diarrhea, unspecified: Secondary | ICD-10-CM | POA: Diagnosis not present

## 2020-03-27 DIAGNOSIS — F329 Major depressive disorder, single episode, unspecified: Secondary | ICD-10-CM | POA: Diagnosis present

## 2020-03-27 DIAGNOSIS — M6281 Muscle weakness (generalized): Secondary | ICD-10-CM | POA: Diagnosis not present

## 2020-03-27 DIAGNOSIS — R269 Unspecified abnormalities of gait and mobility: Secondary | ICD-10-CM | POA: Diagnosis not present

## 2020-03-27 DIAGNOSIS — E119 Type 2 diabetes mellitus without complications: Secondary | ICD-10-CM | POA: Diagnosis not present

## 2020-03-27 DIAGNOSIS — R911 Solitary pulmonary nodule: Secondary | ICD-10-CM | POA: Diagnosis not present

## 2020-03-27 DIAGNOSIS — E1169 Type 2 diabetes mellitus with other specified complication: Secondary | ICD-10-CM | POA: Diagnosis not present

## 2020-03-27 DIAGNOSIS — C259 Malignant neoplasm of pancreas, unspecified: Secondary | ICD-10-CM | POA: Diagnosis not present

## 2020-03-27 DIAGNOSIS — R2689 Other abnormalities of gait and mobility: Secondary | ICD-10-CM | POA: Diagnosis present

## 2020-03-27 DIAGNOSIS — Z8673 Personal history of transient ischemic attack (TIA), and cerebral infarction without residual deficits: Secondary | ICD-10-CM | POA: Diagnosis not present

## 2020-03-27 DIAGNOSIS — Z79899 Other long term (current) drug therapy: Secondary | ICD-10-CM | POA: Diagnosis not present

## 2020-04-01 DIAGNOSIS — R911 Solitary pulmonary nodule: Secondary | ICD-10-CM | POA: Diagnosis not present

## 2020-04-01 DIAGNOSIS — C259 Malignant neoplasm of pancreas, unspecified: Secondary | ICD-10-CM | POA: Diagnosis not present

## 2020-04-09 DIAGNOSIS — M6281 Muscle weakness (generalized): Secondary | ICD-10-CM | POA: Diagnosis not present

## 2020-04-09 DIAGNOSIS — R269 Unspecified abnormalities of gait and mobility: Secondary | ICD-10-CM | POA: Diagnosis not present

## 2020-04-09 DIAGNOSIS — E119 Type 2 diabetes mellitus without complications: Secondary | ICD-10-CM | POA: Diagnosis not present

## 2020-04-16 DIAGNOSIS — R269 Unspecified abnormalities of gait and mobility: Secondary | ICD-10-CM | POA: Diagnosis not present

## 2020-04-16 DIAGNOSIS — M6281 Muscle weakness (generalized): Secondary | ICD-10-CM | POA: Diagnosis not present

## 2020-04-16 DIAGNOSIS — E119 Type 2 diabetes mellitus without complications: Secondary | ICD-10-CM | POA: Diagnosis not present

## 2020-04-17 DIAGNOSIS — R197 Diarrhea, unspecified: Secondary | ICD-10-CM | POA: Diagnosis not present

## 2020-04-17 DIAGNOSIS — E1169 Type 2 diabetes mellitus with other specified complication: Secondary | ICD-10-CM | POA: Diagnosis not present

## 2020-04-17 DIAGNOSIS — I10 Essential (primary) hypertension: Secondary | ICD-10-CM | POA: Diagnosis not present

## 2020-04-17 DIAGNOSIS — Z79899 Other long term (current) drug therapy: Secondary | ICD-10-CM | POA: Diagnosis not present

## 2020-04-29 DIAGNOSIS — R600 Localized edema: Secondary | ICD-10-CM | POA: Diagnosis not present

## 2020-04-29 DIAGNOSIS — K8689 Other specified diseases of pancreas: Secondary | ICD-10-CM | POA: Diagnosis not present

## 2020-04-29 DIAGNOSIS — F329 Major depressive disorder, single episode, unspecified: Secondary | ICD-10-CM | POA: Diagnosis not present

## 2020-05-14 NOTE — Progress Notes (Deleted)
Referring Provider: [name], MD Primary Care Physician:  [name], MD Primary GI Physician: Dr. Rayne Du chief complaint on file.   HPI:   Kathryn Cook is a 69 y.o. female presenting today for follow-up of persistent diarrhea.   Prior evaluation for diarrhea has included negative C. difficile, negative GI pathogen panel, normal celiac serologies, and normal TSH.  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  She is admitted to the hospital 02/28/2020-03/07/2020 due to acute renal injury in the setting of this persistent diarrhea as well as UTI, diastolic heart failure, and acute respiratory failure.  MRI completed inpatient with findings suggestive of primary pancreatic adenocarcinoma with metastasis.  Suspected this to the etiology of diarrhea.  Patient and family elected to go with comfort care/palliative/hospice.  She was continued on Creon and Imodium for diarrhea.  Past Medical History:  Diagnosis Date  . Bronchitis   . CVA (cerebral infarction)   . Diabetes mellitus without complication ([place])   . Hyperlipidemia   . Hypertension   . Memory loss   . Stroke ([place])   . Substance abuse ([place])    alcohol, but patient states she doesn't drink that much anymore    Past Surgical History:  Procedure Laterality Date  . COLONOSCOPY N/A 08/22/2018   two 4-5 mm polyps in descending colon and at IC valve. Tubular adenomas.   Marland Kitchen POLYPECTOMY  08/22/2018   Procedure: POLYPECTOMY;  Surgeon: Daneil Dolin, MD;  Location: [facility] SU[facility];  Service: Endoscopy;;  colon    Current Outpatient Medications  Medication Sig Dispense Refill  . acetaminophen (TYLENOL) 325 MG tablet Take 2 tablets (650 mg total) by mouth every 6 (six) hours as needed for mild pain, fever or headache (or  Fever >/= 101). 12 tablet 2  . carvedilol (COREG) 3.125 MG tablet Take 1 tablet (3.125 mg total) by mouth 2 (two) times daily with a meal. 60 tablet 1  . COMBIVENT RESPIMAT 20-100 MCG/ACT AERS respimat Inhale 1 puff into the lungs 4 (four) times daily as needed for wheezing or shortness of breath (cough).     . diphenoxylate-atropine (LOMOTIL) 2.5-0.025 MG tablet Take 1 tablet by mouth 4 (four) times daily. 120 tablet 1  . HYDROcodone-acetaminophen (HYCET) 7.5-325 mg/15 ml solution Take 15 mLs by mouth 4 (four) times daily as needed for moderate pain. Pancreatic cancer 240 mL 0  . insulin aspart (NOVOLOG FLEXPEN) 100 UNIT/ML FlexPen Inject 0-10 Units into the skin See admin instructions. insulin aspart (novoLOG) injection 0-10 Units 0-10 Units Subcutaneous, 3 times daily with meals CBG < 70: Implement Hypoglycemia Standing Orders and refer to Hypoglycemia Standing Orders sidebar report  CBG 70 - 120: 0 unit CBG 121 - 150: 0 unit  CBG 151 - 200: 1 unit CBG 201 - 250: 2 units CBG 251 - 300: 4 units CBG 301 - 350: 6 units  CBG 351 - 400: 8 units  CBG > 400: 10 units 1 pen 0  . insulin glargine (LANTUS) 100 UNIT/ML injection Inject 0.2 mLs (20 Units total) into the skin at bedtime. 10 mL 11  . ketoconazole (NIZORAL) 2 % cream Apply 1 application topically daily.    . lipase/protease/amylase (CREON) 36000 UNITS CPEP capsule Take 3 capsules (108,000 Units total) by mouth 3 (three) times daily with meals. 270 capsule 2  .  lipase/protease/amylase (CREON) 36000 UNITS CPEP capsule Take 1 capsule (36,000 Units total) by mouth with snacks. 270 capsule 0  . loperamide (IMODIUM) 2 MG capsule Take 2 capsules (4 mg total) by mouth every 8 (eight) hours as needed for diarrhea or loose stools. 120 capsule 3  . megestrol (MEGACE) 400 MG/10ML suspension Take 10 mLs (400 mg total) by mouth 2 (two) times daily. For appetite stimulation 480 mL 0  . mirtazapine (REMERON) 15 MG tablet Take 1 tablet (15 mg total) by mouth at  bedtime. For sleep, mood and appetite stimulation 30 tablet 2  . Multiple Vitamin (MULTIVITAMIN) tablet Take 1 tablet by mouth daily.    . ondansetron (ZOFRAN ODT) 4 MG disintegrating tablet Take 1 tablet (4 mg total) by mouth every 8 (eight) hours as needed for nausea or vomiting. 20 tablet 0  . Propylene Glycol (SYSTANE COMPLETE OP) Apply 1 drop to eye 3 (three) times daily.     Marland Kitchen umeclidinium bromide (INCRUSE ELLIPTA) 62.5 MCG/INH AEPB Inhale 1 puff into the lungs daily.     No current facility-administered medications for this visit.    Allergies as of 05/15/2020 - Review Complete 02/28/2020  Allergen Reaction Noted  . Ace inhibitors Cough 10/01/2013  . Diovan [valsartan] Cough 10/18/2013    Family History  Problem Relation Age of Onset  . Other Mother        unknown  . Other Father        unknown  . Colon cancer Neg Hx     Social History   Socioeconomic History  . Marital status: Single    Spouse name: Not on file  . Number of children: 1  . Years of education: 24  . Highest education level: Not on file  Occupational History  . Occupation: unemployed  Tobacco Use  . Smoking status: Former Smoker    Packs/day: 0.25    Years: 45.00    Pack years: 11.25    Types: Cigarettes    Quit date: 09/27/2013    Years since quitting: 6.6  . Smokeless tobacco: Never Used  Substance and Sexual Activity  . Alcohol use: No    Comment: occasional  . Drug use: No    Types: Marijuana    Comment: last used 3 months ago  . Sexual activity: Yes  Other Topics Concern  . Not on file  Social History Narrative   Patient is single and lives at [facility] home.   Patient has one child.   Patient is right-handed.   Patient drinks two cups of coffee daily.   Patient has an 11 th grade education.   Social Determinants of Health   Financial Resource Strain:   . Difficulty of Paying Living Expenses:   Food Insecurity:   . Worried About Charity fundraiser in the Last  Year:   . Arboriculturist in the Last Year:   Transportation Needs:   . Film/video editor (Medical):   Marland Kitchen Lack of Transportation (Non-Medical):   Physical Activity:   . Days of Exercise per Week:   . Minutes of Exercise per Session:   Stress:   . Feeling of Stress :   Social Connections:   . Frequency of Communication with Friends and Family:   . Frequency of Social Gatherings with Friends and Family:   . Attends Religious Services:   . Active Member of Clubs or Organizations:   . Attends Archivist Meetings:   Marland Kitchen Marital Status:  Review of Systems: Gen: Denies fever, chills, anorexia. Denies fatigue, weakness, weight loss.  CV: Denies chest pain, palpitations, syncope, peripheral edema, and claudication. Resp: Denies dyspnea at rest, cough, wheezing, coughing up blood, and pleurisy. GI: Denies vomiting blood, jaundice, and fecal incontinence.   Denies dysphagia or odynophagia. Derm: Denies rash, itching, dry skin Psych: Denies depression, anxiety, memory loss, confusion. No homicidal or suicidal ideation.  Heme: Denies bruising, bleeding, and enlarged lymph nodes.  Physical Exam: There were no vitals taken for this visit. General:   Alert and oriented. No distress noted. Pleasant and cooperative.  Head:  Normocephalic and atraumatic. Eyes:  Conjuctiva clear without scleral icterus. Mouth:  Oral mucosa pink and moist. Good dentition. No lesions. Heart:  S1, S2 present without murmurs appreciated. Lungs:  Clear to auscultation bilaterally. No wheezes, rales, or rhonchi. No distress.  Abdomen:  +BS, soft, non-tender and non-distended. No rebound or guarding. No HSM or masses noted. Msk:  Symmetrical without gross deformities. Normal posture. Extremities:  Without edema. Neurologic:  Alert and  oriented x4 Psych:  Alert and cooperative. Normal mood and affect.

## 2020-05-15 ENCOUNTER — Encounter: Payer: Self-pay | Admitting: Internal Medicine

## 2020-05-15 ENCOUNTER — Ambulatory Visit: Payer: Medicare Other | Admitting: Gastroenterology

## 2020-05-30 DIAGNOSIS — R404 Transient alteration of awareness: Secondary | ICD-10-CM | POA: Diagnosis not present

## 2020-05-30 DIAGNOSIS — I499 Cardiac arrhythmia, unspecified: Secondary | ICD-10-CM | POA: Diagnosis not present

## 2020-06-03 ENCOUNTER — Ambulatory Visit: Payer: Medicare Other | Admitting: Nurse Practitioner

## 2020-06-27 DEATH — deceased

## 2023-08-17 ENCOUNTER — Encounter: Payer: Self-pay | Admitting: *Deleted
